# Patient Record
Sex: Female | Born: 1950 | Race: Black or African American | Hispanic: No | Marital: Married | Smoking: Never smoker
Health system: Southern US, Community
[De-identification: ages and names within clinical notes are randomized; demographics above are authoritative.]

## PROBLEM LIST (undated history)

## (undated) DIAGNOSIS — K219 Gastro-esophageal reflux disease without esophagitis: Secondary | ICD-10-CM

## (undated) DIAGNOSIS — N879 Dysplasia of cervix uteri, unspecified: Secondary | ICD-10-CM

## (undated) DIAGNOSIS — T7840XA Allergy, unspecified, initial encounter: Secondary | ICD-10-CM

## (undated) DIAGNOSIS — F329 Major depressive disorder, single episode, unspecified: Secondary | ICD-10-CM

## (undated) DIAGNOSIS — G4730 Sleep apnea, unspecified: Secondary | ICD-10-CM

## (undated) DIAGNOSIS — M549 Dorsalgia, unspecified: Secondary | ICD-10-CM

## (undated) DIAGNOSIS — I10 Essential (primary) hypertension: Secondary | ICD-10-CM

## (undated) DIAGNOSIS — G4733 Obstructive sleep apnea (adult) (pediatric): Secondary | ICD-10-CM

## (undated) DIAGNOSIS — E669 Obesity, unspecified: Secondary | ICD-10-CM

## (undated) DIAGNOSIS — M75100 Unspecified rotator cuff tear or rupture of unspecified shoulder, not specified as traumatic: Secondary | ICD-10-CM

## (undated) DIAGNOSIS — R0602 Shortness of breath: Secondary | ICD-10-CM

## (undated) DIAGNOSIS — D649 Anemia, unspecified: Secondary | ICD-10-CM

## (undated) DIAGNOSIS — M1990 Unspecified osteoarthritis, unspecified site: Secondary | ICD-10-CM

## (undated) DIAGNOSIS — IMO0002 Reserved for concepts with insufficient information to code with codable children: Secondary | ICD-10-CM

## (undated) DIAGNOSIS — F32A Depression, unspecified: Secondary | ICD-10-CM

## (undated) DIAGNOSIS — D571 Sickle-cell disease without crisis: Secondary | ICD-10-CM

## (undated) DIAGNOSIS — E785 Hyperlipidemia, unspecified: Secondary | ICD-10-CM

## (undated) DIAGNOSIS — Z9289 Personal history of other medical treatment: Secondary | ICD-10-CM

## (undated) DIAGNOSIS — E66811 Obesity, class 1: Secondary | ICD-10-CM

## (undated) DIAGNOSIS — E6609 Other obesity due to excess calories: Secondary | ICD-10-CM

## (undated) DIAGNOSIS — I2699 Other pulmonary embolism without acute cor pulmonale: Secondary | ICD-10-CM

## (undated) HISTORY — DX: Depression, unspecified: F32.A

## (undated) HISTORY — DX: Major depressive disorder, single episode, unspecified: F32.9

## (undated) HISTORY — PX: BREAST EXCISIONAL BIOPSY: SUR124

## (undated) HISTORY — PX: INJECTION KNEE: SHX2446

## (undated) HISTORY — DX: Hyperlipidemia, unspecified: E78.5

## (undated) HISTORY — DX: Allergy, unspecified, initial encounter: T78.40XA

## (undated) HISTORY — PX: VAGINAL HYSTERECTOMY: SUR661

## (undated) HISTORY — PX: INGUINAL HERNIA REPAIR: SUR1180

## (undated) HISTORY — DX: Sickle-cell disease without crisis: D57.1

## (undated) HISTORY — DX: Dysplasia of cervix uteri, unspecified: N87.9

## (undated) HISTORY — DX: Obesity, class 1: E66.811

## (undated) HISTORY — DX: Obesity, unspecified: E66.9

## (undated) HISTORY — DX: Obstructive sleep apnea (adult) (pediatric): G47.33

## (undated) HISTORY — DX: Other obesity due to excess calories: E66.09

## (undated) HISTORY — DX: Other pulmonary embolism without acute cor pulmonale: I26.99

## (undated) HISTORY — DX: Essential (primary) hypertension: I10

## (undated) HISTORY — PX: INCONTINENCE SURGERY: SHX676

## (undated) HISTORY — PX: TONSILLECTOMY: SUR1361

## (undated) HISTORY — PX: CATARACT EXTRACTION: SUR2

## (undated) HISTORY — PX: TOE SURGERY: SHX1073

## (undated) HISTORY — PX: OTHER SURGICAL HISTORY: SHX169

## (undated) HISTORY — DX: Gastro-esophageal reflux disease without esophagitis: K21.9

## (undated) HISTORY — PX: TOE AMPUTATION: SHX809

## (undated) HISTORY — PX: ABDOMINAL HYSTERECTOMY: SHX81

## (undated) HISTORY — PX: EYE SURGERY: SHX253

## (undated) HISTORY — PX: BREAST LUMPECTOMY: SHX2

## (undated) HISTORY — PX: BREAST BIOPSY: SHX20

---

## 1975-08-09 DIAGNOSIS — Z86711 Personal history of pulmonary embolism: Secondary | ICD-10-CM

## 1975-08-09 DIAGNOSIS — I2699 Other pulmonary embolism without acute cor pulmonale: Secondary | ICD-10-CM

## 1975-08-09 HISTORY — DX: Other pulmonary embolism without acute cor pulmonale: I26.99

## 1975-08-09 HISTORY — DX: Personal history of pulmonary embolism: Z86.711

## 1983-12-09 HISTORY — PX: REVISION OF SCAR ON TORSO: SHX2350

## 2012-04-01 HISTORY — PX: ESOPHAGOGASTRODUODENOSCOPY: SHX1529

## 2012-06-14 ENCOUNTER — Encounter: Payer: Self-pay | Admitting: Internal Medicine

## 2012-06-14 ENCOUNTER — Ambulatory Visit (INDEPENDENT_AMBULATORY_CARE_PROVIDER_SITE_OTHER): Payer: Federal, State, Local not specified - PPO | Admitting: Internal Medicine

## 2012-06-14 VITALS — BP 118/84 | HR 67 | Temp 97.2°F | Resp 16 | Ht 68.0 in | Wt 243.0 lb

## 2012-06-14 DIAGNOSIS — F338 Other recurrent depressive disorders: Secondary | ICD-10-CM

## 2012-06-14 DIAGNOSIS — I10 Essential (primary) hypertension: Secondary | ICD-10-CM | POA: Insufficient documentation

## 2012-06-14 DIAGNOSIS — K439 Ventral hernia without obstruction or gangrene: Secondary | ICD-10-CM

## 2012-06-14 DIAGNOSIS — M1710 Unilateral primary osteoarthritis, unspecified knee: Secondary | ICD-10-CM

## 2012-06-14 DIAGNOSIS — R32 Unspecified urinary incontinence: Secondary | ICD-10-CM

## 2012-06-14 DIAGNOSIS — E785 Hyperlipidemia, unspecified: Secondary | ICD-10-CM

## 2012-06-14 DIAGNOSIS — IMO0002 Reserved for concepts with insufficient information to code with codable children: Secondary | ICD-10-CM

## 2012-06-14 DIAGNOSIS — F39 Unspecified mood [affective] disorder: Secondary | ICD-10-CM

## 2012-06-14 DIAGNOSIS — E782 Mixed hyperlipidemia: Secondary | ICD-10-CM | POA: Insufficient documentation

## 2012-06-14 DIAGNOSIS — K219 Gastro-esophageal reflux disease without esophagitis: Secondary | ICD-10-CM | POA: Insufficient documentation

## 2012-06-14 DIAGNOSIS — Z90710 Acquired absence of both cervix and uterus: Secondary | ICD-10-CM

## 2012-06-14 DIAGNOSIS — Z87898 Personal history of other specified conditions: Secondary | ICD-10-CM

## 2012-06-14 DIAGNOSIS — Z8669 Personal history of other diseases of the nervous system and sense organs: Secondary | ICD-10-CM

## 2012-06-14 DIAGNOSIS — M179 Osteoarthritis of knee, unspecified: Secondary | ICD-10-CM

## 2012-06-14 DIAGNOSIS — Z780 Asymptomatic menopausal state: Secondary | ICD-10-CM

## 2012-06-14 DIAGNOSIS — Z87410 Personal history of cervical dysplasia: Secondary | ICD-10-CM

## 2012-06-14 HISTORY — DX: Acquired absence of both cervix and uterus: Z90.710

## 2012-06-14 HISTORY — DX: Essential (primary) hypertension: I10

## 2012-06-14 HISTORY — DX: Mixed hyperlipidemia: E78.2

## 2012-06-14 HISTORY — DX: Hyperlipidemia, unspecified: E78.5

## 2012-06-14 HISTORY — DX: Gastro-esophageal reflux disease without esophagitis: K21.9

## 2012-06-14 NOTE — Progress Notes (Signed)
Subjective:    Patient ID: Judy [name], female    DOB: [birth year], 61 y.o.   MRN: [id number]  HPI  Judy [name] is here as a new pt., first visit to establish care.  PMH of cervical dysplasia S/P Hysterectomey,  Mixed incontinence S/P bladder sling for pelvic prolapse, DJD of knees and back, seasonal depression per her history treated with Prozac in the past, HTN, GERD, hyperlipidemia, and  Sleep apnea now off CPAP from recent sleep study.  She also reports she has a long standing ventral hernia that does not bother her  She has had recent labs by Dr. Rocky [name] of [place] and does not want any labs done today  She also was treated by Dr. Noe [name] for C.Difficile and had both upper and lower endoscopy withing the last year.  She reports C diff has resolved.   She reports she would like to discuss weight loss and her incontinence.  She lost 30 # with weight watchers and is starting to gain it back.  She swims daily.  She did not like [employer].  She has mixed incontinence long standing.   She had a bladder sling repair in 2005 for pelvic prolapse.  Denies dysuria or gross blood in urine  Allergies  Allergen Reactions  . Ivp Dye (Iodinated Diagnostic Agents) Swelling   Past Medical History  Diagnosis Date  . Hypertension    No past surgical history on file. History   Social History  . Marital Status: Married    Spouse Name: N/A    Number of Children: N/A  . Years of Education: N/A   Occupational History  . Not on file.   Social History Main Topics  . Smoking status: Not on file  . Smokeless tobacco: Not on file  . Alcohol Use: Not on file  . Drug Use: Not on file  . Sexually Active: Not on file   Other Topics Concern  . Not on file   Social History Narrative  . No narrative on file   No family history on file. There is no problem list on file for this patient.  Current Outpatient Prescriptions on File Prior to Visit  Medication Sig Dispense Refill  . atorvastatin (LIPITOR) 20 MG  tablet Take 20 mg by mouth daily.      . candesartan-hydrochlorothiazide (ATACAND HCT) 16-12.5 MG per tablet Take 1 tablet by mouth daily.      . lansoprazole (PREVACID) 30 MG capsule Take 30 mg by mouth daily.          Review of Systems See HPI    Objective:   Physical Exam Physical Exam  Nursing note and vitals reviewed.  Constitutional: She is oriented to person, place, and time. She appears well-developed and well-nourished.  HENT:  Head: Normocephalic and atraumatic.  Cardiovascular: Normal rate and regular rhythm. Exam reveals no gallop and no friction rub.  No murmur heard.  Pulmonary/Chest: Breath sounds normal. She has no wheezes. She has no rales. Abd:  Easily reducible ventral hernia, mid abd wall.  BS +  NT/ND. No rebound  Neurological: She is alert and oriented to person, place, and time.  Skin: Skin is warm and dry.  Psychiatric: She has a normal mood and affect. Her behavior is normal.          Assessment & Plan:  HTN well controlled  Hyperlipidemia  Will need old records from former primary.  She does not want blood draw today  Mixed incontinence  I offered 3 options  of Pelvic floor PT, meds or urology consultation.  She opted to try PT for now.  Again did not want labs or testing today.  Will refer to Judy [name] and pt counseled to call if not helpful for her  Obesity:   Dash diet given,  Increase exercise.  Offered referral to Dr. Kinnie [name] but she wishes to wait for now  Ventral hernia  counsled if pain in area of hernia N/V swelling of abd to call office  DJD controlled  GERD  H/o cervical dyspasia  H/o sleep apnea.  Pt reports recent sleep study neg and was advised by former primary to stop CPAP

## 2012-06-14 NOTE — Patient Instructions (Signed)
See me as needed   Sign for old records  Will refer you to physical therapy

## 2012-06-15 ENCOUNTER — Encounter: Payer: Self-pay | Admitting: Internal Medicine

## 2012-06-15 DIAGNOSIS — F338 Other recurrent depressive disorders: Secondary | ICD-10-CM | POA: Insufficient documentation

## 2012-06-15 DIAGNOSIS — K439 Ventral hernia without obstruction or gangrene: Secondary | ICD-10-CM

## 2012-06-15 DIAGNOSIS — M179 Osteoarthritis of knee, unspecified: Secondary | ICD-10-CM

## 2012-06-15 DIAGNOSIS — Z87410 Personal history of cervical dysplasia: Secondary | ICD-10-CM | POA: Insufficient documentation

## 2012-06-15 DIAGNOSIS — Z8669 Personal history of other diseases of the nervous system and sense organs: Secondary | ICD-10-CM | POA: Insufficient documentation

## 2012-06-15 DIAGNOSIS — M1710 Unilateral primary osteoarthritis, unspecified knee: Secondary | ICD-10-CM | POA: Insufficient documentation

## 2012-06-15 DIAGNOSIS — R32 Unspecified urinary incontinence: Secondary | ICD-10-CM | POA: Insufficient documentation

## 2012-06-15 DIAGNOSIS — Z780 Asymptomatic menopausal state: Secondary | ICD-10-CM | POA: Insufficient documentation

## 2012-06-15 HISTORY — DX: Ventral hernia without obstruction or gangrene: K43.9

## 2012-06-15 HISTORY — DX: Unspecified urinary incontinence: R32

## 2012-06-15 HISTORY — DX: Other recurrent depressive disorders: F33.8

## 2012-06-15 HISTORY — DX: Unilateral primary osteoarthritis, unspecified knee: M17.10

## 2012-06-15 HISTORY — DX: Personal history of cervical dysplasia: Z87.410

## 2012-06-15 HISTORY — DX: Osteoarthritis of knee, unspecified: M17.9

## 2012-06-15 HISTORY — DX: Asymptomatic menopausal state: Z78.0

## 2013-01-12 ENCOUNTER — Ambulatory Visit (INDEPENDENT_AMBULATORY_CARE_PROVIDER_SITE_OTHER): Payer: Federal, State, Local not specified - PPO | Admitting: Internal Medicine

## 2013-01-12 ENCOUNTER — Encounter: Payer: Self-pay | Admitting: Internal Medicine

## 2013-01-12 VITALS — BP 130/74 | HR 76 | Temp 98.5°F | Resp 16 | Ht 68.0 in | Wt 243.0 lb

## 2013-01-12 DIAGNOSIS — R222 Localized swelling, mass and lump, trunk: Secondary | ICD-10-CM

## 2013-01-12 DIAGNOSIS — I10 Essential (primary) hypertension: Secondary | ICD-10-CM

## 2013-01-12 LAB — COMPREHENSIVE METABOLIC PANEL
ALT: 26 U/L (ref 0–35)
AST: 30 U/L (ref 0–37)
Albumin: 3.9 g/dL (ref 3.5–5.2)
Alkaline Phosphatase: 76 U/L (ref 39–117)
Potassium: 4 mEq/L (ref 3.5–5.3)
Sodium: 140 mEq/L (ref 135–145)
Total Protein: 6.8 g/dL (ref 6.0–8.3)

## 2013-01-12 LAB — LIPID PANEL: LDL Cholesterol: 94 mg/dL (ref 0–99)

## 2013-01-12 MED ORDER — AMOXICILLIN-POT CLAVULANATE 500-125 MG PO TABS: ORAL_TABLET | ORAL | Status: DC

## 2013-01-12 NOTE — Patient Instructions (Addendum)
See me in 2 weeks   Schedule CPE with me

## 2013-01-12 NOTE — Progress Notes (Signed)
Subjective:    Patient ID: Judy [name], female    DOB: 1950/12/12, 62 y.o.   MRN: [id number]  HPI Judy [name] is here with acute visit.  She noted 2 weeks ago a very sore and swollen area near her R breast.  She had been wearing a wire bra and thought maybe there was some irritation from this.  She believes the area may have gotten smaller over the two weeks but still there.  NO fever  No nipple discharge  FH  Breast cancer in GGM   Last mamm[facility] and pt reports they have been normal  Allergies  Allergen Reactions  . Ivp Dye (Iodinated Diagnostic Agents) Swelling   Past Medical History  Diagnosis Date  . Hypertension   . Dysplasia of cervix   . GERD (gastroesophageal reflux disease)   . Hyperlipidemia   . Obesity    Past Surgical History  Procedure Date  . Vaginal hysterectomy   . Tonsillectomy   . Toe surgery     congenital  . Breast lumpectomy     axillary bilat  . Tummy tuck   . Inguinal hernia repair     bilat   History   Social History  . Marital Status: Married    Spouse Name: N/A    Number of Children: N/A  . 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 Smoking status: Never Smoker   . Smokeless tobacco: Never Used  . Alcohol Use: No     Comment: rarely  . Drug Use: No  . Sexually Active: No   Other Topics Concern  . Not on file   Social History Narrative  . No narrative on file   Family History  Problem Relation Age of Onset  . Cancer Mother     lung  . Cancer Sister     breast  . Diabetes Maternal Grandmother   . Hypertension Mother   . Heart disease Son     congenital   Patient Active Problem List  Diagnosis  . S/P hysterectomy  . Essential hypertension, benign  . Other and unspecified hyperlipidemia  . GERD (gastroesophageal reflux disease)  . History of cervical dysplasia  . OA (osteoarthritis) of knee  . Seasonal depression  . Menopause  . History of sleep apnea  .  Urinary incontinence  . Ventral hernia   Current Outpatient Prescriptions on File Prior to Visit  Medication Sig Dispense Refill  . aspirin 325 MG EC tablet Take 325 mg by mouth daily.      Marland Kitchen atorvastatin (LIPITOR) 20 MG tablet Take 20 mg by mouth daily.      . candesartan-hydrochlorothiazide (ATACAND HCT) 16-12.5 MG per tablet Take 1 tablet by mouth daily.      . celecoxib (CELEBREX) 200 MG capsule Take 200 mg by mouth daily.      . Cholecalciferol (VITAMIN D) 1000 UNITS capsule Take 1,000 Units by mouth daily.      . cyclobenzaprine (FLEXERIL) 10 MG tablet Take 10 mg by mouth 3 (three) times daily as needed.      . lansoprazole (PREVACID) 30 MG capsule Take 30 mg by mouth daily.             Review of Systems    see HPI Objective:   Physical Exam  Physical Exam  Nursing note and vitals reviewed.  Constitutional: She is oriented to person, place, and time. She appears well-developed and  well-nourished.  HENT:  Head: Normocephalic and atraumatic.  Cardiovascular: Normal rate and regular rhythm. Exam reveals no gallop and no friction rub.  No murmur heard.  Pulmonary/Chest: Breath sounds normal. She has no wheezes. She has no rales.  Breast exam.  Careful exam of both breast reveals no nipple discharge no discrete mass no axillary adenopathy bilaterally.  She does have very point tender mass R lateral chest wall near her R breast that is very tender.  Measures just under 1 cm.   Neurological: She is alert and oriented to person, place, and time.  Skin: Skin is warm and dry.  Psychiatric: She has a normal mood and affect. Her behavior is normal.       Assessment & Plan:  Chest wall mass   With tenderness will empirically give Augmentin 500 bid for 10 DAYS.  She is to return to see me in two weeks.  Will also get diagnositc mm with ultrasound of R breast.    HTN:  Well controlled will get baseline labs  Pt is to schedule CPE  Pt voices understanding

## 2013-01-13 LAB — CBC WITH DIFFERENTIAL/PLATELET
Basophils Absolute: 0 10*3/uL (ref 0.0–0.1)
Basophils Relative: 0 % (ref 0–1)
Hemoglobin: 11.7 g/dL — ABNORMAL LOW (ref 12.0–15.0)
MCHC: 33.5 g/dL (ref 30.0–36.0)
Neutro Abs: 4.9 10*3/uL (ref 1.7–7.7)
Neutrophils Relative %: 59 % (ref 43–77)
RDW: 14.3 % (ref 11.5–15.5)
WBC: 8.4 10*3/uL (ref 4.0–10.5)

## 2013-01-18 ENCOUNTER — Telehealth: Payer: Self-pay | Admitting: Internal Medicine

## 2013-01-18 DIAGNOSIS — K317 Polyp of stomach and duodenum: Secondary | ICD-10-CM

## 2013-01-18 DIAGNOSIS — Z803 Family history of malignant neoplasm of breast: Secondary | ICD-10-CM

## 2013-01-18 DIAGNOSIS — Z860100 Personal history of colon polyps, unspecified: Secondary | ICD-10-CM

## 2013-01-18 DIAGNOSIS — G4733 Obstructive sleep apnea (adult) (pediatric): Secondary | ICD-10-CM

## 2013-01-18 DIAGNOSIS — Z90710 Acquired absence of both cervix and uterus: Secondary | ICD-10-CM

## 2013-01-18 HISTORY — DX: Polyp of stomach and duodenum: K31.7

## 2013-01-18 HISTORY — DX: Personal history of colonic polyps: Z86.010

## 2013-01-18 HISTORY — DX: Personal history of colon polyps, unspecified: Z86.0100

## 2013-01-18 HISTORY — DX: Obstructive sleep apnea (adult) (pediatric): G47.33

## 2013-01-18 HISTORY — DX: Family history of malignant neoplasm of breast: Z80.3

## 2013-01-18 NOTE — Telephone Encounter (Signed)
Spoke with pt and informed of MM andR breast ultrasound results.   06/2012 report from [facility] states two of pts sisters diagnosed with breast cancer  Age 60 and age  46.   Pt tells me these were half sisters on her father's side.   Her maternal GGM also had breast cancer.    Pt advised at last visit tocome in 2 weeks for re-examination.  I again advised her to keep the follow up appt so I can re-examine her. If chest wall mass still present,  I will refer for surgical opinion.  Pt voices understanding  I note that pt did not schedule a follow up visit as of thisdate.   I advised her to schedule appt. andshe is aware that breast cancer can uncommonly present as a mass on chest wall.  She voices understanding

## 2013-01-19 ENCOUNTER — Encounter: Payer: Self-pay | Admitting: *Deleted

## 2013-01-31 ENCOUNTER — Telehealth: Payer: Self-pay | Admitting: *Deleted

## 2013-02-02 ENCOUNTER — Telehealth: Payer: Self-pay | Admitting: *Deleted

## 2013-02-02 NOTE — Telephone Encounter (Signed)
Left message awaiting return call

## 2013-02-07 ENCOUNTER — Ambulatory Visit (INDEPENDENT_AMBULATORY_CARE_PROVIDER_SITE_OTHER): Payer: Federal, State, Local not specified - PPO | Admitting: Internal Medicine

## 2013-02-07 ENCOUNTER — Encounter: Payer: Self-pay | Admitting: Internal Medicine

## 2013-02-07 VITALS — BP 110/65 | HR 73 | Temp 97.8°F | Resp 18 | Wt 241.0 lb

## 2013-02-07 DIAGNOSIS — R2230 Localized swelling, mass and lump, unspecified upper limb: Secondary | ICD-10-CM | POA: Insufficient documentation

## 2013-02-07 DIAGNOSIS — R229 Localized swelling, mass and lump, unspecified: Secondary | ICD-10-CM

## 2013-02-07 DIAGNOSIS — M79621 Pain in right upper arm: Secondary | ICD-10-CM

## 2013-02-07 DIAGNOSIS — R2231 Localized swelling, mass and lump, right upper limb: Secondary | ICD-10-CM

## 2013-02-07 DIAGNOSIS — D649 Anemia, unspecified: Secondary | ICD-10-CM

## 2013-02-07 DIAGNOSIS — M79609 Pain in unspecified limb: Secondary | ICD-10-CM

## 2013-02-07 HISTORY — DX: Localized swelling, mass and lump, unspecified upper limb: R22.30

## 2013-02-07 NOTE — Progress Notes (Signed)
Subjective:    Patient ID: Judy [name], female    DOB: 03-21-1951, 62 y.o.   MRN: [id number]  HPI  Judy [name] is here for follow up..    See diagnostic mm result [facility].  R breast has no masses or calcifications.  There is scarring in R axillary area  Pt still reports pain in axillary area.  She is on Celebrex 200 mg but not helping She has very strong FH of breast cancer .  Two half sisters aged 16 and 17 and grandmother  See labs minimal anemia   Allergies  Allergen Reactions  . Ivp Dye (Iodinated Diagnostic Agents) Swelling   Past Medical History  Diagnosis Date  . Hypertension   . Dysplasia of cervix   . GERD (gastroesophageal reflux disease)   . Hyperlipidemia   . Obesity    Past Surgical History  Procedure Laterality Date  . Vaginal hysterectomy    . Tonsillectomy    . Toe surgery      congenital  . Breast lumpectomy      axillary bilat  . Tummy tuck    . Inguinal hernia repair      bilat   History   Social History  . Marital Status: Married    Spouse Name: N/A    Number of Children: N/A  . 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 Smoking status: Never Smoker   . Smokeless tobacco: Never Used  . Alcohol Use: No     Comment: rarely  . Drug Use: No  . Sexually Active: No   Other Topics Concern  . Not on file   Social History Narrative  . No narrative on file   Family History  Problem Relation Age of Onset  . Cancer Mother     lung  . Cancer Sister     breast  . Diabetes Maternal Grandmother   . Hypertension Mother   . Heart disease Son     congenital   Patient Active Problem List  Diagnosis  . S/P hysterectomy  . Essential hypertension, benign  . Other and unspecified hyperlipidemia  . GERD (gastroesophageal reflux disease)  . History of cervical dysplasia  . OA (osteoarthritis) of knee  . Seasonal depression  . Menopause  . History of sleep apnea  . Urinary  incontinence  . Ventral hernia  . Family history of breast cancer in sister  . OSA (obstructive sleep apnea)  . Personal history of colonic polyps  . Gastric polyp  . Axillary mass   Current Outpatient Prescriptions on File Prior to Visit  Medication Sig Dispense Refill  . aspirin 325 MG EC tablet Take 325 mg by mouth daily.      Marland Kitchen atorvastatin (LIPITOR) 20 MG tablet Take 20 mg by mouth daily.      . candesartan-hydrochlorothiazide (ATACAND HCT) 16-12.5 MG per tablet Take 1 tablet by mouth daily.      . celecoxib (CELEBREX) 200 MG capsule Take 200 mg by mouth daily.      . Cholecalciferol (VITAMIN D) 1000 UNITS capsule Take 1,000 Units by mouth daily.      . lansoprazole (PREVACID) 30 MG capsule Take 30 mg by mouth daily.      . cyclobenzaprine (FLEXERIL) 10 MG tablet Take 10 mg by mouth 3 (three) times daily as needed.       No current facility-administered medications on file prior to visit.  Review of Systems    see HPI Objective:   Physical Exam Physical Exam  Nursing note and vitals reviewed.  Constitutional: She is oriented to person, place, and time. She appears well-developed and well-nourished.  HENT:  Head: Normocephalic and atraumatic.  Cardiovascular: Normal rate and regular rhythm. Exam reveals no gallop and no friction rub.  No murmur heard.  Pulmonary/Chest: Breath sounds normal. She has no wheezes. She has no rales.  R breast exam.  Area of thickening lower axillary area .  Painful to palpation Neurological: She is alert and oriented to person, place, and time.  Skin: Skin is warm and dry.  Psychiatric: She has a normal mood and affect. Her behavior is normal.             Assessment & Plan:  R axillary mass/thickening.  Long discussion with pt.  Given strong FH she and I discussed surgical evaluation and we are both in agreement to this.  Will refer to surgery  Minimal anemia  Will recheck next visit.

## 2013-02-07 NOTE — Patient Instructions (Addendum)
Will refer to surgery.

## 2013-02-08 ENCOUNTER — Telehealth: Payer: Self-pay | Admitting: *Deleted

## 2013-02-08 ENCOUNTER — Encounter: Payer: Self-pay | Admitting: *Deleted

## 2013-02-08 NOTE — Telephone Encounter (Signed)
Left message regarding appt  With Dr Jamey Ripa on 3/18 at [facility]

## 2013-02-08 NOTE — Telephone Encounter (Signed)
Faxed mm report to Dr Weldon Inches office attn. Lesly Rubenstein

## 2013-02-09 ENCOUNTER — Encounter: Payer: Self-pay | Admitting: *Deleted

## 2013-02-09 ENCOUNTER — Telehealth: Payer: Self-pay | Admitting: *Deleted

## 2013-02-09 NOTE — Telephone Encounter (Signed)
Lab results mailed to pt home address

## 2013-02-14 NOTE — Telephone Encounter (Signed)
appt

## 2013-02-22 ENCOUNTER — Ambulatory Visit (INDEPENDENT_AMBULATORY_CARE_PROVIDER_SITE_OTHER): Payer: Self-pay | Admitting: Surgery

## 2013-02-23 ENCOUNTER — Ambulatory Visit (INDEPENDENT_AMBULATORY_CARE_PROVIDER_SITE_OTHER): Payer: Federal, State, Local not specified - PPO | Admitting: Internal Medicine

## 2013-02-23 ENCOUNTER — Encounter: Payer: Self-pay | Admitting: Internal Medicine

## 2013-02-23 ENCOUNTER — Ambulatory Visit (HOSPITAL_BASED_OUTPATIENT_CLINIC_OR_DEPARTMENT_OTHER)
Admission: RE | Admit: 2013-02-23 | Discharge: 2013-02-23 | Disposition: A | Discharge status: Home / Self Care | Payer: Federal, State, Local not specified - PPO | Source: Ambulatory Visit | Attending: Internal Medicine | Admitting: Internal Medicine

## 2013-02-23 VITALS — BP 139/67 | HR 84 | Temp 97.7°F | Resp 18 | Wt 240.0 lb

## 2013-02-23 DIAGNOSIS — R109 Unspecified abdominal pain: Secondary | ICD-10-CM | POA: Insufficient documentation

## 2013-02-23 DIAGNOSIS — Z87442 Personal history of urinary calculi: Secondary | ICD-10-CM | POA: Insufficient documentation

## 2013-02-23 DIAGNOSIS — R319 Hematuria, unspecified: Secondary | ICD-10-CM | POA: Insufficient documentation

## 2013-02-23 HISTORY — DX: Personal history of urinary calculi: Z87.442

## 2013-02-23 LAB — POCT URINALYSIS DIPSTICK
Bilirubin, UA: NEGATIVE
Ketones, UA: NEGATIVE
Leukocytes, UA: NEGATIVE

## 2013-02-23 MED ORDER — HYDROCODONE-ACETAMINOPHEN 7.5-750 MG PO TABS: 1.0000 | ORAL_TABLET | Freq: Four times a day (QID) | ORAL | Status: DC | PRN

## 2013-02-23 MED ORDER — CYCLOBENZAPRINE HCL 10 MG PO TABS: 10.0000 mg | ORAL_TABLET | Freq: Three times a day (TID) | ORAL | Status: DC | PRN

## 2013-02-23 NOTE — Patient Instructions (Addendum)
To have CT today  Strain urine daily

## 2013-02-23 NOTE — Progress Notes (Signed)
Subjective:    Patient ID: Judy [name], female    DOB: 1951/07/24, 62 y.o.   MRN: [id number]  HPI Judy [name] is here with acute visit.  She has had several weeks of R sided back pain.  She thought it was lumbar strain.  Yesterday had more focal pain in R flank area similar to prior kidney stone but not as severe    No fever no reported gross blood in urine.    Allergies  Allergen Reactions  . Ivp Dye (Iodinated Diagnostic Agents) Swelling   Past Medical History  Diagnosis Date  . Hypertension   . Dysplasia of cervix   . GERD (gastroesophageal reflux disease)   . Hyperlipidemia   . Obesity    Past Surgical History  Procedure Laterality Date  . Vaginal hysterectomy    . Tonsillectomy    . Toe surgery      congenital  . Breast lumpectomy      axillary bilat  . Tummy tuck    . Inguinal hernia repair      bilat   History   Social History  . Marital Status: Married    Spouse Name: N/A    Number of Children: N/A  . 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 Smoking status: Never Smoker   . Smokeless tobacco: Never Used  . Alcohol Use: No     Comment: rarely  . Drug Use: No  . Sexually Active: No   Other Topics Concern  . Not on file   Social History Narrative  . No narrative on file   Family History  Problem Relation Age of Onset  . Cancer Mother     lung  . Cancer Sister     breast  . Diabetes Maternal Grandmother   . Hypertension Mother   . Heart disease Son     congenital   Patient Active Problem List  Diagnosis  . S/P hysterectomy  . Essential hypertension, benign  . Other and unspecified hyperlipidemia  . GERD (gastroesophageal reflux disease)  . History of cervical dysplasia  . OA (osteoarthritis) of knee  . Seasonal depression  . Menopause  . History of sleep apnea  . Urinary incontinence  . Ventral hernia  . Family history of breast cancer in sister  . OSA (obstructive sleep apnea)  .  Personal history of colonic polyps  . Gastric polyp  . Axillary mass  . Personal history of renal calculi   Current Outpatient Prescriptions on File Prior to Visit  Medication Sig Dispense Refill  . aspirin 325 MG EC tablet Take 325 mg by mouth daily.      Marland Kitchen atorvastatin (LIPITOR) 20 MG tablet Take 20 mg by mouth daily.      . candesartan-hydrochlorothiazide (ATACAND HCT) 16-12.5 MG per tablet Take 1 tablet by mouth daily.      . celecoxib (CELEBREX) 200 MG capsule Take 200 mg by mouth daily.      . Cholecalciferol (VITAMIN D) 1000 UNITS capsule Take 1,000 Units by mouth daily.      . lansoprazole (PREVACID) 30 MG capsule Take 30 mg by mouth daily.      . cyclobenzaprine (FLEXERIL) 10 MG tablet Take 10 mg by mouth 3 (three) times daily as needed.       No current facility-administered medications on file prior to visit.      Review of Systems See HPI    Objective:  Physical Exam Physical Exam  Nursing note and vitals reviewed.  Constitutional: She is oriented to person, place, and time. She appears well-developed and well-nourished.  HENT:  Head: Normocephalic and atraumatic.  Cardiovascular: Normal rate and regular rhythm. Exam reveals no gallop and no friction rub.  No murmur heard.  Pulmonary/Chest: Breath sounds normal. She has no wheezes. She has no rales.  Abd  L flank no pain  R flank tender to palpation.  SLR neg bilaterally Neurological: She is alert and oriented to person, place, and time.  Skin: Skin is warm and dry.  Psychiatric: She has a normal mood and affect. Her behavior is normal.        Assessment & Plan:  Hematuria  R flank pain  Will get CT today  OK for Vicodin 7.5 mg/750  1 q6h prn.  Avoid driving   Ok for flexeril 10 mg as well  Further management based results  History of renal stone  Will refer to urology

## 2013-02-24 ENCOUNTER — Telehealth: Payer: Self-pay | Admitting: Internal Medicine

## 2013-02-24 DIAGNOSIS — M549 Dorsalgia, unspecified: Secondary | ICD-10-CM

## 2013-02-24 MED ORDER — HYDROCODONE-ACETAMINOPHEN 5-500 MG PO TABS: 1.0000 | ORAL_TABLET | Freq: Three times a day (TID) | ORAL | Status: DC | PRN

## 2013-02-24 MED ORDER — CIPROFLOXACIN HCL 500 MG PO TABS: 500.0000 mg | ORAL_TABLET | Freq: Two times a day (BID) | ORAL | Status: DC

## 2013-02-24 NOTE — Addendum Note (Signed)
Addended by: Mathews Robinsons on: 02/24/2013 03:53 PM   Modules accepted: Orders

## 2013-02-24 NOTE — Telephone Encounter (Signed)
Pt states the pain medication that was prescribe to her on 03.19.14 was not accepted bc of 750 MG OF TYLENOL... Pt states she can be reached at [phone]

## 2013-02-24 NOTE — Telephone Encounter (Signed)
Pt needs another rx for pain meds Pharmacy will not fill any rx with more than 500 mg of acetaminophen pt was also told that it could not be called in she has to bring an RX in to the store

## 2013-02-24 NOTE — Telephone Encounter (Signed)
Called in vicodin 5/325 to [facility]

## 2013-02-24 NOTE — Telephone Encounter (Signed)
Spoke with pt.  Will change Vicodin to 5/325 mg 1 q8h prn. And will empirically cover with Cipro 500 mg bid for 5 days until culture return.    Pt voices understanding

## 2013-02-25 ENCOUNTER — Ambulatory Visit (INDEPENDENT_AMBULATORY_CARE_PROVIDER_SITE_OTHER): Payer: Self-pay | Admitting: Surgery

## 2013-02-26 LAB — URINE CULTURE: Organism ID, Bacteria: NO GROWTH

## 2013-02-28 ENCOUNTER — Encounter (INDEPENDENT_AMBULATORY_CARE_PROVIDER_SITE_OTHER): Payer: Self-pay

## 2013-03-01 ENCOUNTER — Telehealth: Payer: Self-pay | Admitting: Internal Medicine

## 2013-03-01 NOTE — Telephone Encounter (Signed)
Spoke with pt and informed of urine cultrue results. She states pain is improving.  She has upcoming appt with urologist

## 2013-03-09 ENCOUNTER — Other Ambulatory Visit: Payer: Self-pay | Admitting: Internal Medicine

## 2013-03-10 NOTE — Telephone Encounter (Signed)
Vicodin called in to [facility]

## 2013-03-10 NOTE — Telephone Encounter (Signed)
Refill request will call in pending approval.Pt states that her back is killing her and sx improved slightly but have began again will make an appt when she returns from out of town. Pt states that her Sister passed away this week and she is out of town and needs pain control while she is out of town

## 2013-03-28 ENCOUNTER — Encounter: Payer: Self-pay | Admitting: Internal Medicine

## 2013-03-28 ENCOUNTER — Encounter: Payer: Self-pay | Admitting: *Deleted

## 2013-03-28 ENCOUNTER — Ambulatory Visit (INDEPENDENT_AMBULATORY_CARE_PROVIDER_SITE_OTHER): Payer: Federal, State, Local not specified - PPO | Admitting: Internal Medicine

## 2013-03-28 VITALS — BP 125/73 | HR 107 | Temp 97.4°F | Resp 18 | Wt 244.0 lb

## 2013-03-28 DIAGNOSIS — R937 Abnormal findings on diagnostic imaging of other parts of musculoskeletal system: Secondary | ICD-10-CM

## 2013-03-28 DIAGNOSIS — Z87442 Personal history of urinary calculi: Secondary | ICD-10-CM

## 2013-03-28 DIAGNOSIS — M549 Dorsalgia, unspecified: Secondary | ICD-10-CM

## 2013-03-28 HISTORY — DX: Abnormal findings on diagnostic imaging of other parts of musculoskeletal system: R93.7

## 2013-03-28 LAB — BASIC METABOLIC PANEL
CO2: 29 mEq/L (ref 19–32)
Chloride: 104 mEq/L (ref 96–112)
Creat: 0.9 mg/dL (ref 0.50–1.10)
Potassium: 4.6 mEq/L (ref 3.5–5.3)

## 2013-03-28 MED ORDER — CYCLOBENZAPRINE HCL 10 MG PO TABS: 10.0000 mg | ORAL_TABLET | Freq: Three times a day (TID) | ORAL | Status: DC | PRN

## 2013-03-28 MED ORDER — HYDROCODONE-ACETAMINOPHEN 5-325 MG PO TABS: 1.0000 | ORAL_TABLET | ORAL | Status: DC | PRN

## 2013-03-28 MED ORDER — CELECOXIB 200 MG PO CAPS: 200.0000 mg | ORAL_CAPSULE | Freq: Every day | ORAL | Status: DC

## 2013-03-28 NOTE — Progress Notes (Addendum)
Subjective:    Patient ID: Judy [name], female    DOB: [birth year], 62 y.o.   MRN: [id number]  HPI Judy [name]  Is here for acute visit. She has been travelling a lot lately and her Lumbar/sacral back pain is persistant.  She did see Dr. Mena Goes at [facility] and he felt no further work up indicarted at this time.    She uses Celebrex but not helping her pain.  See CT she does have question of hemangioma at L2 She denies numbness or paresthesias.  Pain worse on R side.  She does have history of herniated disc around 2006 .  Last MRI 2006 per pt.  She had to cancel her appt with Dr. Jamey Ripa twice .  She was to see heim for evaluation of R axillary mass.    Allergies  Allergen Reactions  . Ivp Dye (Iodinated Diagnostic Agents) Swelling   Past Medical History  Diagnosis Date  . Hypertension   . Dysplasia of cervix   . GERD (gastroesophageal reflux disease)   . Hyperlipidemia   . Obesity    Past Surgical History  Procedure Laterality Date  . Vaginal hysterectomy    . Tonsillectomy    . Toe surgery      congenital  . Breast lumpectomy      axillary bilat  . Tummy tuck    . Inguinal hernia repair      bilat   History   Social History  . Marital Status: Married    Spouse Name: N/A    Number of Children: N/A  . 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 Smoking status: Never Smoker   . Smokeless tobacco: Never Used  . Alcohol Use: No     Comment: rarely  . Drug Use: No  . Sexually Active: No   Other Topics Concern  . Not on file   Social History Narrative  . No narrative on file   Family History  Problem Relation Age of Onset  . Cancer Mother     lung  . Hypertension Mother   . Cancer Sister     breast  . Diabetes Maternal Grandmother   . Heart disease Son     congenital   Patient Active Problem List  Diagnosis  . S/P hysterectomy  . Essential hypertension, benign  . Other and unspecified hyperlipidemia  .  GERD (gastroesophageal reflux disease)  . History of cervical dysplasia  . OA (osteoarthritis) of knee  . Seasonal depression  . Menopause  . History of sleep apnea  . Urinary incontinence  . Ventral hernia  . Family history of breast cancer in sister  . OSA (obstructive sleep apnea)  . Personal history of colonic polyps  . Gastric polyp  . Axillary mass  . Personal history of renal calculi  . Abnormal CT of spine   Current Outpatient Prescriptions on File Prior to Visit  Medication Sig Dispense Refill  . aspirin 325 MG EC tablet Take 325 mg by mouth daily.      Marland Kitchen atorvastatin (LIPITOR) 20 MG tablet Take 20 mg by mouth daily.      . candesartan-hydrochlorothiazide (ATACAND HCT) 16-12.5 MG per tablet Take 1 tablet by mouth daily.      . Cholecalciferol (VITAMIN D) 1000 UNITS capsule Take 1,000 Units by mouth daily.      . lansoprazole (PREVACID) 30 MG capsule Take 30 mg by mouth daily.  No current facility-administered medications on file prior to visit.       Review of Systems See HPI    Objective:   Physical Exam  Physical Exam  Nursing note and vitals reviewed.  Constitutional: She is oriented to person, place, and time. She appears well-developed and well-nourished.  HENT:  Head: Normocephalic and atraumatic.  Cardiovascular: Normal rate and regular rhythm. Exam reveals no gallop and no friction rub.  No murmur heard.  Pulmonary/Chest: Breath sounds normal. She has no wheezes. She has no rales.  Neurological: She is alert and oriented to person, place, and time. LE:  REflexes  Sluggish bilaterally but present Motor LE  5/5 bilaterally SLR pos on Right Sensory intact to fine touch bilaterally Skin: Skin is warm and dry.  Psychiatric: She has a normal mood and affect. Her behavior is normal.             Assessment & Plan:  Persistant back pain:  With abnormal CT and question of hemangioma  Will get MRI w/wo contrast. Ok for flexeril up to TID prn and  Vicodin q4h prn.     Abnormal lumbar CT  See above.    R sided axillary mass  I counseled pt of the importance of rescheduling her evalaution with surgeon Dr. Jamey Ripa.  She voices understanding  Addendum  See CT  Multilevel disc protrusions will refer to neurosurgery

## 2013-03-28 NOTE — Patient Instructions (Addendum)
Will have MRI  Tomorrow

## 2013-03-29 ENCOUNTER — Telehealth: Payer: Self-pay | Admitting: *Deleted

## 2013-03-29 ENCOUNTER — Ambulatory Visit (INDEPENDENT_AMBULATORY_CARE_PROVIDER_SITE_OTHER): Payer: Federal, State, Local not specified - PPO

## 2013-03-29 ENCOUNTER — Other Ambulatory Visit: Payer: Self-pay | Admitting: Internal Medicine

## 2013-03-29 ENCOUNTER — Telehealth: Payer: Self-pay | Admitting: Internal Medicine

## 2013-03-29 DIAGNOSIS — D1809 Hemangioma of other sites: Secondary | ICD-10-CM

## 2013-03-29 DIAGNOSIS — M5106 Intervertebral disc disorders with myelopathy, lumbar region: Secondary | ICD-10-CM

## 2013-03-29 DIAGNOSIS — R937 Abnormal findings on diagnostic imaging of other parts of musculoskeletal system: Secondary | ICD-10-CM

## 2013-03-29 DIAGNOSIS — M5126 Other intervertebral disc displacement, lumbar region: Secondary | ICD-10-CM

## 2013-03-29 DIAGNOSIS — M545 Low back pain, unspecified: Secondary | ICD-10-CM

## 2013-03-29 MED ORDER — OXYCODONE-ACETAMINOPHEN 7.5-325 MG PO TABS: 1.0000 | ORAL_TABLET | ORAL | Status: DC | PRN

## 2013-03-29 MED ORDER — GADOBENATE DIMEGLUMINE 529 MG/ML IV SOLN
20.0000 mL | Freq: Once | INTRAVENOUS | Status: AC | PRN
  Administered 2013-03-29: 20 mL via INTRAVENOUS

## 2013-03-29 NOTE — Telephone Encounter (Signed)
Spoke with pt and informed of MRI results.    Will refer to Dr. Madalyn Rob  Set up referral to Dr. Maeola Harman for pt

## 2013-03-29 NOTE — Telephone Encounter (Signed)
Notified pt that rx for percocet was here for her to pick up

## 2013-03-29 NOTE — Telephone Encounter (Signed)
Percocet called in to [facility] was told that pt needs to bring RX into pharmacy Will notify pt

## 2013-03-29 NOTE — Telephone Encounter (Signed)
All forms faxed to Dr [facility] office

## 2013-03-30 ENCOUNTER — Other Ambulatory Visit: Payer: Self-pay | Admitting: Internal Medicine

## 2013-03-30 ENCOUNTER — Telehealth: Payer: Self-pay | Admitting: *Deleted

## 2013-03-30 MED ORDER — PREDNISONE 20 MG PO TABS: ORAL_TABLET | ORAL | Status: DC

## 2013-03-30 NOTE — Telephone Encounter (Signed)
Notified pt that prednisone was sent to pharmacy

## 2013-04-14 ENCOUNTER — Telehealth: Payer: Self-pay | Admitting: *Deleted

## 2013-04-14 MED ORDER — OXYCODONE-ACETAMINOPHEN 7.5-325 MG PO TABS: 1.0000 | ORAL_TABLET | ORAL | Status: DC | PRN

## 2013-04-14 NOTE — Telephone Encounter (Signed)
Notified pt of RX waiting for her

## 2013-04-14 NOTE — Telephone Encounter (Signed)
Pt would like a RX for pain meds she is to see Dr Venetia Maxon next week

## 2013-05-18 ENCOUNTER — Encounter: Payer: Self-pay | Admitting: Internal Medicine

## 2013-05-18 ENCOUNTER — Other Ambulatory Visit: Payer: Self-pay | Admitting: Internal Medicine

## 2013-05-18 ENCOUNTER — Telehealth: Payer: Self-pay | Admitting: *Deleted

## 2013-05-18 ENCOUNTER — Ambulatory Visit (INDEPENDENT_AMBULATORY_CARE_PROVIDER_SITE_OTHER): Payer: Federal, State, Local not specified - PPO | Admitting: Internal Medicine

## 2013-05-18 VITALS — BP 135/74 | HR 92 | Temp 98.0°F | Resp 18 | Wt 245.0 lb

## 2013-05-18 DIAGNOSIS — M25562 Pain in left knee: Secondary | ICD-10-CM

## 2013-05-18 DIAGNOSIS — G4700 Insomnia, unspecified: Secondary | ICD-10-CM | POA: Insufficient documentation

## 2013-05-18 DIAGNOSIS — M25569 Pain in unspecified knee: Secondary | ICD-10-CM

## 2013-05-18 HISTORY — DX: Insomnia, unspecified: G47.00

## 2013-05-18 MED ORDER — LORAZEPAM 1 MG PO TABS: ORAL_TABLET | ORAL | Status: DC

## 2013-05-18 NOTE — Telephone Encounter (Signed)
Called pt to see if she would like to come in at 3:45

## 2013-05-18 NOTE — Progress Notes (Signed)
Subjective:    Patient ID: Judy [name], female    DOB: [birth year], 62 y.o.   MRN: [id number]  HPI [name] is here for acute visit  She notes several weeks of L knee pain.  This has been a chronic problem for her and has responded to steroid injections in the past.  She denies injury or trauma.  She reports minimal swelling at times but no redness She denies knee locking or giving away  She did see Dr. Venetia Maxon and Dr. Ollen Bowl for lumber injection and this has greatly improved her back discomfort  See MRI  [name] also travels for speaking engagements and will have intermittant insomnia esp prior to her presentations ("hard to shut my mind down".  She is leaving [place] next week and will be travelling to [place] later this summer.  She would like something for intermittant insomnia  Allergies  Allergen Reactions  . Ivp Dye (Iodinated Diagnostic Agents) Swelling   Past Medical History  Diagnosis Date  . Hypertension   . Dysplasia of cervix   . GERD (gastroesophageal reflux disease)   . Hyperlipidemia   . Obesity    Past Surgical History  Procedure Laterality Date  . Vaginal hysterectomy    . Tonsillectomy    . Toe surgery      congenital  . Breast lumpectomy      axillary bilat  . Tummy tuck    . Inguinal hernia repair      bilat   History   Social History  . Marital Status: Married    Spouse Name: N/A    Number of Children: N/A  . 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 Smoking status: Never Smoker   . Smokeless tobacco: Never Used  . Alcohol Use: No     Comment: rarely  . Drug Use: No  . Sexually Active: No   Other Topics Concern  . Not on file   Social History Narrative  . No narrative on file   Family History  Problem Relation Age of Onset  . Cancer Mother     lung  . Hypertension Mother   . Cancer Sister     breast  . Diabetes Maternal Grandmother   . Heart disease Son     congenital    Patient Active Problem List   Diagnosis Date Noted  . Insomnia 05/18/2013  . Abnormal CT of spine 03/28/2013  . Personal history of renal calculi 02/23/2013  . Axillary mass 02/07/2013  . Family history of breast cancer in sister 01/18/2013  . OSA (obstructive sleep apnea) 01/18/2013  . Personal history of colonic polyps 01/18/2013  . Gastric polyp 01/18/2013  . History of cervical dysplasia 06/15/2012  . OA (osteoarthritis) of knee 06/15/2012  . Seasonal depression 06/15/2012  . Menopause 06/15/2012  . History of sleep apnea 06/15/2012  . Urinary incontinence 06/15/2012  . Ventral hernia 06/15/2012  . S/P hysterectomy 06/14/2012  . Essential hypertension, benign 06/14/2012  . Other and unspecified hyperlipidemia 06/14/2012  . GERD (gastroesophageal reflux disease) 06/14/2012   Current Outpatient Prescriptions on File Prior to Visit  Medication Sig Dispense Refill  . aspirin 325 MG EC tablet Take 325 mg by mouth daily.      Marland Kitchen atorvastatin (LIPITOR) 20 MG tablet Take 20 mg by mouth daily.      . candesartan-hydrochlorothiazide (ATACAND HCT) 16-12.5 MG per tablet Take 1 tablet by mouth daily.      Marland Kitchen  celecoxib (CELEBREX) 200 MG capsule Take 1 capsule (200 mg total) by mouth daily.  30 capsule  1  . Cholecalciferol (VITAMIN D) 1000 UNITS capsule Take 1,000 Units by mouth daily.      . cyclobenzaprine (FLEXERIL) 10 MG tablet Take 1 tablet (10 mg total) by mouth 3 (three) times daily as needed.  30 tablet  1  . lansoprazole (PREVACID) 30 MG capsule Take 30 mg by mouth daily.      Marland Kitchen oxyCODONE-acetaminophen (PERCOCET) 7.5-325 MG per tablet Take 1 tablet by mouth every 4 (four) hours as needed for pain.  30 tablet  0   No current facility-administered medications on file prior to visit.       Review of Systems    see HPI Objective:   Physical Exam  Physical Exam  Nursing note and vitals reviewed.  Constitutional: She is oriented to person, place, and time. She appears  well-developed and well-nourished.  HENT:  Head: Normocephalic and atraumatic.  Cardiovascular: Normal rate and regular rhythm. Exam reveals no gallop and no friction rub.  No murmur heard.  Pulmonary/Chest: Breath sounds normal. She has no wheezes. She has no rales.  Neurological: She is alert and oriented to person, place, and time.  Skin: Skin is warm and dry.  EXT:  L knee no active synovitis.  Crepitus present Lachmann's drawer signs negative Limited ROB due to discomfort Psychiatric: She has a normal mood and affect. Her behavior is normal.        Assessment & Plan:  L knee pain    I do not have prior records to determine if imaging done in the past.  She has resonded well to steroid injections in the past  Will refer to Dr. 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  Advised not to use Ativan nightly but 1-2 times per week prn

## 2013-05-19 DIAGNOSIS — M25562 Pain in left knee: Secondary | ICD-10-CM

## 2013-05-19 HISTORY — DX: Pain in left knee: M25.562

## 2013-05-19 NOTE — Telephone Encounter (Signed)
appt made

## 2013-05-19 NOTE — Patient Instructions (Addendum)
Will refer to Dr. Pearletha Forge

## 2013-05-24 ENCOUNTER — Other Ambulatory Visit: Payer: Self-pay | Admitting: *Deleted

## 2013-05-24 MED ORDER — CELECOXIB 200 MG PO CAPS: 200.0000 mg | ORAL_CAPSULE | Freq: Every day | ORAL | Status: DC

## 2013-05-24 NOTE — Telephone Encounter (Signed)
Refill request

## 2013-06-06 ENCOUNTER — Telehealth: Payer: Self-pay | Admitting: *Deleted

## 2013-06-06 NOTE — Telephone Encounter (Signed)
Patient called wanting lab work and to possibly see Dr Kathie Rhodes.  She states she has had steroid injections for her back and knee recently, and now she has tingling in her feet and has increased urination.  She is worried about her blood sugar possibly being high.

## 2013-06-07 NOTE — Telephone Encounter (Signed)
Pt states that she recently had steroid injection in knee and has had tingling in feet and frequent urination

## 2013-06-08 ENCOUNTER — Encounter: Payer: Self-pay | Admitting: Internal Medicine

## 2013-06-08 ENCOUNTER — Ambulatory Visit (INDEPENDENT_AMBULATORY_CARE_PROVIDER_SITE_OTHER): Payer: Federal, State, Local not specified - PPO | Admitting: Internal Medicine

## 2013-06-08 VITALS — BP 130/68 | HR 83 | Temp 97.4°F | Resp 18 | Wt 248.0 lb

## 2013-06-08 DIAGNOSIS — G4733 Obstructive sleep apnea (adult) (pediatric): Secondary | ICD-10-CM

## 2013-06-08 DIAGNOSIS — R32 Unspecified urinary incontinence: Secondary | ICD-10-CM

## 2013-06-08 DIAGNOSIS — I10 Essential (primary) hypertension: Secondary | ICD-10-CM

## 2013-06-08 DIAGNOSIS — R3915 Urgency of urination: Secondary | ICD-10-CM

## 2013-06-08 LAB — CBC WITH DIFFERENTIAL/PLATELET
Basophils Relative: 0 % (ref 0–1)
Eosinophils Absolute: 0.1 10*3/uL (ref 0.0–0.7)
Eosinophils Relative: 1 % (ref 0–5)
HCT: 36.6 % (ref 36.0–46.0)
Hemoglobin: 12 g/dL (ref 12.0–15.0)
Lymphs Abs: 3.1 10*3/uL (ref 0.7–4.0)
MCH: 26.3 pg (ref 26.0–34.0)
MCHC: 32.8 g/dL (ref 30.0–36.0)
MCV: 80.3 fL (ref 78.0–100.0)
Monocytes Absolute: 0.6 10*3/uL (ref 0.1–1.0)
Monocytes Relative: 6 % (ref 3–12)

## 2013-06-08 NOTE — Patient Instructions (Addendum)
Activate my chart 

## 2013-06-08 NOTE — Progress Notes (Signed)
Subjective:    Patient ID: Judy [name], female    DOB: 1951-10-29, 62 y.o.   MRN: [id number]  HPI Judy [name] is here for acute visit for two issues.  She has recent lumbar and knee steroid injections and has been having urinary frequency and tingling in her extremities, she wanted to have her glucose checked  She also has long standing urinary incontinence since her bladder sling surgery performed in 2006 for pelvic prolapse.  Incontinence not related to stress or urge,  She reports she leaks mostly at night in any position.  She has worn a pad for years.    Pt also reports a history of OSA requiring CPAP  She had managed to lose 30 lbs and did not require her machine.  She has been gaining weight and feels her OSA has returned and would like to be evaluated again.  Chronic daytime fatigue issues and trouble maintaining sleep  Allergies  Allergen Reactions  . Ivp Dye (Iodinated Diagnostic Agents) Swelling   Past Medical History  Diagnosis Date  . Hypertension   . Dysplasia of cervix   . GERD (gastroesophageal reflux disease)   . Hyperlipidemia   . Obesity    Past Surgical History  Procedure Laterality Date  . Vaginal hysterectomy    . Tonsillectomy    . Toe surgery      congenital  . Breast lumpectomy      axillary bilat  . Tummy tuck    . Inguinal hernia repair      bilat   History   Social History  . Marital Status: Married    Spouse Name: N/A    Number of Children: N/A  . 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 Smoking status: Never Smoker   . Smokeless tobacco: Never Used  . Alcohol Use: No     Comment: rarely  . Drug Use: No  . Sexually Active: No   Other Topics Concern  . Not on file   Social History Narrative  . No narrative on file   Family History  Problem Relation Age of Onset  . Cancer Mother     lung  . Hypertension Mother   . Cancer Sister     breast  . Diabetes Maternal Grandmother   .  Heart disease Son     congenital   Patient Active Problem List   Diagnosis Date Noted  . Left knee pain 05/19/2013  . Insomnia 05/18/2013  . Abnormal CT of spine 03/28/2013  . Personal history of renal calculi 02/23/2013  . Axillary mass 02/07/2013  . Family history of breast cancer in sister 01/18/2013  . OSA (obstructive sleep apnea) 01/18/2013  . Personal history of colonic polyps 01/18/2013  . Gastric polyp 01/18/2013  . History of cervical dysplasia 06/15/2012  . OA (osteoarthritis) of knee 06/15/2012  . Seasonal depression 06/15/2012  . Menopause 06/15/2012  . History of sleep apnea 06/15/2012  . Urinary incontinence 06/15/2012  . Ventral hernia 06/15/2012  . S/P hysterectomy 06/14/2012  . Essential hypertension, benign 06/14/2012  . Other and unspecified hyperlipidemia 06/14/2012  . GERD (gastroesophageal reflux disease) 06/14/2012   Current Outpatient Prescriptions on File Prior to Visit  Medication Sig Dispense Refill  . aspirin 325 MG EC tablet Take 325 mg by mouth daily.      Marland Kitchen atorvastatin (LIPITOR) 20 MG tablet Take 20 mg by mouth daily.      Marland Kitchen  candesartan-hydrochlorothiazide (ATACAND HCT) 16-12.5 MG per tablet Take 1 tablet by mouth daily.      . celecoxib (CELEBREX) 200 MG capsule Take 1 capsule (200 mg total) by mouth daily.  30 capsule  2  . Cholecalciferol (VITAMIN D) 1000 UNITS capsule Take 1,000 Units by mouth daily.      . cyclobenzaprine (FLEXERIL) 10 MG tablet Take 1 tablet (10 mg total) by mouth 3 (three) times daily as needed.  30 tablet  1  . lansoprazole (PREVACID) 30 MG capsule Take 30 mg by mouth daily.      Marland Kitchen LORazepam (ATIVAN) 1 MG tablet Take 1/2 to one tablet at hs for inomnia  20 tablet  0  . oxyCODONE-acetaminophen (PERCOCET) 7.5-325 MG per tablet Take 1 tablet by mouth every 4 (four) hours as needed for pain.  30 tablet  0   No current facility-administered medications on file prior to visit.       Review of Systems See HPI     Objective:   Physical Exam Physical Exam  Nursing note and vitals reviewed.  Constitutional: She is oriented to person, place, and time. She appears well-developed and well-nourished.  HENT:  Head: Normocephalic and atraumatic.  Cardiovascular: Normal rate and regular rhythm. Exam reveals no gallop and no friction rub.  No murmur heard.  Pulmonary/Chest: Breath sounds normal. She has no wheezes. She has no rales.  Neurological: She is alert and oriented to person, place, and time.  Skin: Skin is warm and dry.  Ext:  No edema Psychiatric: She has a normal mood and affect. Her behavior is normal.          Assessment & Plan:  Urinary urgency  /long standing incontinence  :  U/a today unremarkable,  Will refer to pelvic floor PT.  She does not wish meds.  Will check chemistries today  Fatigue  Will check Tsh  CBC  History of OSA  Will refer to pulmonologist for re-evaluation

## 2013-06-09 ENCOUNTER — Telehealth: Payer: Self-pay | Admitting: *Deleted

## 2013-06-09 LAB — COMPREHENSIVE METABOLIC PANEL
Alkaline Phosphatase: 81 U/L (ref 39–117)
BUN: 13 mg/dL (ref 6–23)
CO2: 29 mEq/L (ref 19–32)
Glucose, Bld: 96 mg/dL (ref 70–99)
Total Bilirubin: 0.7 mg/dL (ref 0.3–1.2)

## 2013-06-09 LAB — VITAMIN D 25 HYDROXY (VIT D DEFICIENCY, FRACTURES): Vit D, 25-Hydroxy: 34 ng/mL (ref 30–89)

## 2013-06-09 LAB — LIPID PANEL
Cholesterol: 229 mg/dL — ABNORMAL HIGH (ref 0–200)
HDL: 50 mg/dL (ref >39)
Total CHOL/HDL Ratio: 4.6 Ratio

## 2013-06-09 LAB — TSH: TSH: 1.011 u[IU]/mL (ref 0.350–4.500)

## 2013-06-09 NOTE — Telephone Encounter (Signed)
Message copied by Mathews Robinsons on Thu Jun 09, 2013 12:14 PM ------      Message from: Raechel Chute D      Created: Thu Jun 09, 2013 10:44 AM       [name]            Call pt and let her know that her bad cholesterol is high.  Give her a 30 min appt (no hurry)  At some time to talk about this ------

## 2013-06-09 NOTE — Telephone Encounter (Signed)
Message copied by Mathews Robinsons on Thu Jun 09, 2013 12:13 PM ------      Message from: Raechel Chute D      Created: Thu Jun 09, 2013 10:44 AM       [name]            Call pt and let her know that her bad cholesterol is high.  Give her a 30 min appt (no hurry)  At some time to talk about this ------

## 2013-06-09 NOTE — Telephone Encounter (Signed)
Notified pt of lab results and need for visit  Pt will call back after the holidays

## 2013-06-13 ENCOUNTER — Ambulatory Visit: Payer: Federal, State, Local not specified - PPO | Attending: Internal Medicine | Admitting: Physical Therapy

## 2013-06-13 DIAGNOSIS — M25559 Pain in unspecified hip: Secondary | ICD-10-CM | POA: Insufficient documentation

## 2013-06-13 DIAGNOSIS — M629 Disorder of muscle, unspecified: Secondary | ICD-10-CM | POA: Insufficient documentation

## 2013-06-13 DIAGNOSIS — M2420 Disorder of ligament, unspecified site: Secondary | ICD-10-CM | POA: Insufficient documentation

## 2013-06-13 DIAGNOSIS — IMO0001 Reserved for inherently not codable concepts without codable children: Secondary | ICD-10-CM | POA: Insufficient documentation

## 2013-06-13 DIAGNOSIS — M25659 Stiffness of unspecified hip, not elsewhere classified: Secondary | ICD-10-CM | POA: Insufficient documentation

## 2013-06-13 DIAGNOSIS — N949 Unspecified condition associated with female genital organs and menstrual cycle: Secondary | ICD-10-CM | POA: Insufficient documentation

## 2013-06-14 ENCOUNTER — Ambulatory Visit: Payer: Federal, State, Local not specified - PPO | Admitting: Physical Therapy

## 2013-06-25 ENCOUNTER — Encounter: Payer: Self-pay | Admitting: Internal Medicine

## 2013-06-25 DIAGNOSIS — Z87448 Personal history of other diseases of urinary system: Secondary | ICD-10-CM

## 2013-06-25 DIAGNOSIS — Z8719 Personal history of other diseases of the digestive system: Secondary | ICD-10-CM | POA: Insufficient documentation

## 2013-06-25 HISTORY — DX: Personal history of other diseases of the digestive system: Z87.19

## 2013-06-25 HISTORY — DX: Personal history of other diseases of urinary system: Z87.448

## 2013-06-27 ENCOUNTER — Ambulatory Visit: Payer: Federal, State, Local not specified - PPO | Admitting: Physical Therapy

## 2013-06-29 ENCOUNTER — Ambulatory Visit: Payer: Federal, State, Local not specified - PPO | Admitting: Physical Therapy

## 2013-06-29 ENCOUNTER — Ambulatory Visit (INDEPENDENT_AMBULATORY_CARE_PROVIDER_SITE_OTHER): Payer: Federal, State, Local not specified - PPO | Admitting: Internal Medicine

## 2013-06-29 ENCOUNTER — Encounter: Payer: Self-pay | Admitting: Internal Medicine

## 2013-06-29 VITALS — BP 130/70 | HR 91 | Temp 98.5°F | Resp 16 | Wt 242.0 lb

## 2013-06-29 DIAGNOSIS — Z87898 Personal history of other specified conditions: Secondary | ICD-10-CM

## 2013-06-29 DIAGNOSIS — Z8669 Personal history of other diseases of the nervous system and sense organs: Secondary | ICD-10-CM

## 2013-06-29 DIAGNOSIS — E785 Hyperlipidemia, unspecified: Secondary | ICD-10-CM

## 2013-06-29 DIAGNOSIS — I10 Essential (primary) hypertension: Secondary | ICD-10-CM

## 2013-06-29 NOTE — Progress Notes (Signed)
Subjective:    Patient ID: Judy [name], female    DOB: [birth year], 62 y.o.   MRN: [id number]  HPI  Judy [name] is here for follow up  She continues to have excessive daytime sleepiness.  She reports she sleeps at night and has not needed to use her Ativan.  She denies depressed mood.  She does take long plane trips for her job  Labs unrevealing .  She does have a history of  Sleep apnea and pending an appt for sleep evaluation  See Lipids  She stopped taking her  Lipitor  Allergies  Allergen Reactions  . Ivp Dye (Iodinated Diagnostic Agents) Swelling   Past Medical History  Diagnosis Date  . Hypertension   . Dysplasia of cervix   . GERD (gastroesophageal reflux disease)   . Hyperlipidemia   . Obesity    Past Surgical History  Procedure Laterality Date  . Vaginal hysterectomy    . Tonsillectomy    . Toe surgery      congenital  . Breast lumpectomy      axillary bilat  . Tummy tuck    . Inguinal hernia repair      bilat  . Injection knee     History   Social History  . Marital Status: Married    Spouse Name: N/A    Number of Children: N/A  . 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 Smoking status: Never Smoker   . Smokeless tobacco: Never Used  . Alcohol Use: No     Comment: rarely  . Drug Use: No  . Sexually Active: No   Other Topics Concern  . Not on file   Social History Narrative  . No narrative on file   Family History  Problem Relation Age of Onset  . Cancer Mother     lung  . Hypertension Mother   . Cancer Sister     breast  . Diabetes Maternal Grandmother   . Heart disease Son     congenital   Patient Active Problem List   Diagnosis Date Noted  . History of hematuria 06/25/2013  . History of gastric polyp 06/25/2013  . Left knee pain 05/19/2013  . Insomnia 05/18/2013  . Abnormal CT of spine 03/28/2013  . Personal history of renal calculi 02/23/2013  . Axillary mass 02/07/2013  .  Family history of breast cancer in sister 01/18/2013  . OSA (obstructive sleep apnea) 01/18/2013  . Personal history of colonic polyps 01/18/2013  . Gastric polyp 01/18/2013  . History of cervical dysplasia 06/15/2012  . OA (osteoarthritis) of knee 06/15/2012  . Seasonal depression 06/15/2012  . Menopause 06/15/2012  . History of sleep apnea 06/15/2012  . Urinary incontinence 06/15/2012  . Ventral hernia 06/15/2012  . S/P hysterectomy 06/14/2012  . Essential hypertension, benign 06/14/2012  . Other and unspecified hyperlipidemia 06/14/2012  . GERD (gastroesophageal reflux disease) 06/14/2012   Current Outpatient Prescriptions on File Prior to Visit  Medication Sig Dispense Refill  . aspirin 325 MG EC tablet Take 325 mg by mouth daily.      Marland Kitchen atorvastatin (LIPITOR) 20 MG tablet Take 20 mg by mouth daily.      . candesartan-hydrochlorothiazide (ATACAND HCT) 16-12.5 MG per tablet Take 1 tablet by mouth daily.      . celecoxib (CELEBREX) 200 MG capsule Take 1 capsule (200 mg total) by mouth daily.  30 capsule  2  . Cholecalciferol (  VITAMIN D) 1000 UNITS capsule Take 1,000 Units by mouth daily.      . cyclobenzaprine (FLEXERIL) 10 MG tablet Take 1 tablet (10 mg total) by mouth 3 (three) times daily as needed.  30 tablet  1  . lansoprazole (PREVACID) 30 MG capsule Take 30 mg by mouth daily.      Marland Kitchen LORazepam (ATIVAN) 1 MG tablet Take 1/2 to one tablet at hs for inomnia  20 tablet  0  . oxyCODONE-acetaminophen (PERCOCET) 7.5-325 MG per tablet Take 1 tablet by mouth every 4 (four) hours as needed for pain.  30 tablet  0   No current facility-administered medications on file prior to visit.    Review of Systems See HPI    Objective:   Physical Exam  Physical Exam  Nursing note and vitals reviewed.  Constitutional: She is oriented to person, place, and time. She appears well-developed and well-nourished.  HENT:  Head: Normocephalic and atraumatic.  Cardiovascular: Normal rate and  regular rhythm. Exam reveals no gallop and no friction rub.  No murmur heard.  Pulmonary/Chest: Breath sounds normal. She has no wheezes. She has no rales.  Neurological: She is alert and oriented to person, place, and time.  Skin: Skin is warm and dry.  Psychiatric: She has a normal mood and affect. Her behavior is normal.            Assessment & Plan:  Fatigue  Pending work up for OSA  Hyperlipidemia I reviewed framingham risk score with pt  12.5%  She would like to  re-initiate lipitor after evaluation for sleep apnea  HTN good control

## 2013-06-29 NOTE — Patient Instructions (Addendum)
Medication list attached

## 2013-07-03 ENCOUNTER — Encounter: Payer: Self-pay | Admitting: Internal Medicine

## 2013-07-03 DIAGNOSIS — M47816 Spondylosis without myelopathy or radiculopathy, lumbar region: Secondary | ICD-10-CM

## 2013-07-03 HISTORY — DX: Spondylosis without myelopathy or radiculopathy, lumbar region: M47.816

## 2013-07-19 ENCOUNTER — Encounter: Payer: Self-pay | Admitting: Pulmonary Disease

## 2013-07-19 ENCOUNTER — Ambulatory Visit (INDEPENDENT_AMBULATORY_CARE_PROVIDER_SITE_OTHER): Payer: Federal, State, Local not specified - PPO | Admitting: Pulmonary Disease

## 2013-07-19 VITALS — BP 124/78 | HR 75 | Temp 98.3°F | Ht 68.0 in | Wt 248.0 lb

## 2013-07-19 DIAGNOSIS — G4733 Obstructive sleep apnea (adult) (pediatric): Secondary | ICD-10-CM

## 2013-07-19 NOTE — Assessment & Plan Note (Addendum)
Given excessive daytime somnolence, narrow pharyngeal exam, witnessed apneas & loud snoring, obstructive sleep apnea is very likely & an overnight polysomnogram will be scheduled as a split study. The pathophysiology of obstructive sleep apnea , it's cardiovascular consequences & modes of treatment including CPAP were discused with the patient in detail & they evidenced understanding. Given that her symptoms have recurred again with weight gain, does very likely that she will need CPAP therapy again Start using CPAP again with full face mask Supplies will be provided Turn in the card before your next appt so that download can be reviewed on current settings

## 2013-07-19 NOTE — Progress Notes (Signed)
Subjective:    Patient ID: Judy [name], female    DOB: 1951/09/22, 62 y.o.   MRN: [id number]  HPI  PCP- Schoenhoff  62 year old never smoker referred for evaluation of obstructive sleep apnea. She also has hypertension , hyperlipidemia, chronic back pain and a remote history of  PE. Epworth sleepiness score is 12 her 24 and she report sleepiness while watching TV and laying down to rest in the afternoon. She has not had any driving incidents. Bedtime is between 10:50 PM, sleep latency is about 20 minutes. She sleeps on her side with 2 pillows, has one nocturnal awakening, denies nocturia and is out of bed at 7 AM feeling tired and occasional morning headaches for last 2 years. She has gained 20 pounds in this period. She denies excessive use of caffeinated beverages. There is no history suggestive of cataplexy, sleep paralysis or parasomnias She underwent an initial polysomnogram in [place] in 2008 and was told that she had REM-related OSA in the early morning, her repeat polysomnogram in 2012 at [facility] showed resolution of OSA, hence CPAP was discontinued. She had lost 35 pounds in the interim but now symptoms seem to have surfaced again.   Past Medical History  Diagnosis Date  . Hypertension   . Dysplasia of cervix   . GERD (gastroesophageal reflux disease)   . Hyperlipidemia   . Obesity   . Pulmonary embolism   . OSA (obstructive sleep apnea)     Past Surgical History  Procedure Laterality Date  . Vaginal hysterectomy    . Tonsillectomy    . Toe surgery      congenital  . Breast lumpectomy      axillary bilat  . Tummy tuck    . Inguinal hernia repair      bilat  . Injection knee      and back    Allergies  Allergen Reactions  . Ivp Dye [Iodinated Diagnostic Agents] Swelling    History   Social History  . Marital Status: Married    Spouse Name: N/A    Number of Children: N/A  . Years of Education: N/A   Occupational History  . consultant    Social  History Main Topics  . Smoking status: Never Smoker   . Smokeless tobacco: Never Used  . Alcohol Use: No     Comment: rarely  . Drug Use: No  . Sexual Activity: No   Other Topics Concern  . Not on file   Social History Narrative  . No narrative on file    Family History  Problem Relation Age of Onset  . Lung cancer Mother     lung  . Hypertension Mother   . Breast cancer Sister     x2  . Diabetes Maternal Grandmother   . Heart disease Son     congenital      Review of Systems  Constitutional: Positive for unexpected weight change. Negative for fever.  HENT: Positive for ear pain. Negative for nosebleeds, congestion, sore throat, rhinorrhea, sneezing, trouble swallowing, dental problem, postnasal drip and sinus pressure.   Eyes: Negative for redness and itching.  Respiratory: Negative for cough, chest tightness, shortness of breath and wheezing.   Cardiovascular: Positive for leg swelling. Negative for palpitations.  Gastrointestinal: Negative for nausea and vomiting.  Genitourinary: Negative for dysuria.  Musculoskeletal: Negative for joint swelling.  Skin: Negative for rash.  Neurological: Positive for headaches.  Hematological: Does not bruise/bleed easily.  Psychiatric/Behavioral: Negative for dysphoric mood. The  patient is not nervous/anxious.        Objective:   Physical Exam  Gen. Pleasant, obese, in no distress, normal affect ENT - no lesions, no post nasal drip, class 2-3 airway Neck: No JVD, no thyromegaly, no carotid bruits Lungs: no use of accessory muscles, no dullness to percussion, decreased without rales or rhonchi  Cardiovascular: Rhythm regular, heart sounds  normal, no murmurs or gallops, no peripheral edema Abdomen: soft and non-tender, no hepatosplenomegaly, BS normal. Musculoskeletal: No deformities, no cyanosis or clubbing Neuro:  alert, non focal, no tremors       Assessment & Plan:

## 2013-07-19 NOTE — Patient Instructions (Addendum)
Sleep study Start using CPAP again with full face mask Supplies will be provided Turn in the card before your next appt

## 2013-07-20 ENCOUNTER — Telehealth: Payer: Self-pay | Admitting: Pulmonary Disease

## 2013-07-20 NOTE — Telephone Encounter (Signed)
I have not. I will have to call in AM to get this faxed over ASAP

## 2013-07-20 NOTE — Telephone Encounter (Signed)
mindy did you get these studies yet thanks libby Tobe Sos

## 2013-07-20 NOTE — Telephone Encounter (Signed)
Spoke to pt she is aware we do not have her sleep studies yet and we can not get the cpap equiptment until we have it pt is ok with this and will flu later Tobe Sos

## 2013-07-21 NOTE — Telephone Encounter (Signed)
Holly returned call. She says she just needed the triage fax # so that she can send the sleep study to dr Vassie Loll. (I gave her the #). Hazel Sams

## 2013-07-21 NOTE — Telephone Encounter (Signed)
Sleep studies have been received. Placed in RA look at

## 2013-07-21 NOTE — Telephone Encounter (Signed)
LMTCB x1 for [facility]

## 2013-07-21 NOTE — Telephone Encounter (Signed)
Judy [name] from [place] called back requesting to speak to nurse.  She can be reached @ [phone]. Judy [name]

## 2013-07-21 NOTE — Telephone Encounter (Signed)
I have called [facility] [phone] to get this faxed over as I sent over release on Tuesday. I was advised it was not received and was giving another fax # [phone]. I have re faxed this over . I also called [facility] at [phone] to get the study from them faxed over as well. Release was also faxed to them on Tuesday. Toma Copier is faxing this over to triage and will await fax.

## 2013-07-22 ENCOUNTER — Other Ambulatory Visit: Payer: Self-pay | Admitting: Pulmonary Disease

## 2013-07-22 DIAGNOSIS — G4733 Obstructive sleep apnea (adult) (pediatric): Secondary | ICD-10-CM

## 2013-07-25 ENCOUNTER — Telehealth: Payer: Self-pay | Admitting: Pulmonary Disease

## 2013-07-25 NOTE — Telephone Encounter (Signed)
PSG 7/09 -260 lbs - AHI 8/h corrected by nasal  CPAP 12 cm. 11/12 AHI 5.6/h -wt 235 lbs, REM AHI 17/h (95 mins)

## 2013-07-26 ENCOUNTER — Other Ambulatory Visit: Payer: Self-pay | Admitting: Pulmonary Disease

## 2013-07-26 DIAGNOSIS — G4733 Obstructive sleep apnea (adult) (pediatric): Secondary | ICD-10-CM

## 2013-08-01 ENCOUNTER — Encounter (HOSPITAL_BASED_OUTPATIENT_CLINIC_OR_DEPARTMENT_OTHER): Payer: Federal, State, Local not specified - PPO

## 2013-08-02 ENCOUNTER — Telehealth: Payer: Self-pay | Admitting: Pulmonary Disease

## 2013-08-02 NOTE — Telephone Encounter (Signed)
lmomtcb x1 for pt 

## 2013-08-02 NOTE — Telephone Encounter (Signed)
RA did order sleep study on pt and it is scheduled. Do we need to tell her any particular thing other than date/time PCC's thanks

## 2013-08-02 NOTE — Telephone Encounter (Signed)
Pt returned call.  Holly D Pryor ° °

## 2013-08-03 ENCOUNTER — Other Ambulatory Visit (INDEPENDENT_AMBULATORY_CARE_PROVIDER_SITE_OTHER): Payer: Federal, State, Local not specified - PPO

## 2013-08-03 ENCOUNTER — Ambulatory Visit (INDEPENDENT_AMBULATORY_CARE_PROVIDER_SITE_OTHER)
Admission: RE | Admit: 2013-08-03 | Discharge: 2013-08-03 | Disposition: A | Discharge status: Home / Self Care | Payer: Federal, State, Local not specified - PPO | Source: Ambulatory Visit | Attending: Internal Medicine | Admitting: Internal Medicine

## 2013-08-03 ENCOUNTER — Encounter: Payer: Self-pay | Admitting: Internal Medicine

## 2013-08-03 ENCOUNTER — Telehealth: Payer: Self-pay | Admitting: *Deleted

## 2013-08-03 ENCOUNTER — Ambulatory Visit (INDEPENDENT_AMBULATORY_CARE_PROVIDER_SITE_OTHER): Payer: Federal, State, Local not specified - PPO | Admitting: Internal Medicine

## 2013-08-03 ENCOUNTER — Telehealth: Payer: Self-pay | Admitting: Pulmonary Disease

## 2013-08-03 VITALS — BP 136/80 | HR 70 | Temp 97.7°F | Ht 67.25 in | Wt 249.0 lb

## 2013-08-03 DIAGNOSIS — I272 Pulmonary hypertension, unspecified: Secondary | ICD-10-CM

## 2013-08-03 DIAGNOSIS — R0600 Dyspnea, unspecified: Secondary | ICD-10-CM

## 2013-08-03 DIAGNOSIS — M7989 Other specified soft tissue disorders: Secondary | ICD-10-CM

## 2013-08-03 DIAGNOSIS — R0609 Other forms of dyspnea: Secondary | ICD-10-CM

## 2013-08-03 HISTORY — DX: Pulmonary hypertension, unspecified: I27.20

## 2013-08-03 HISTORY — DX: Other specified soft tissue disorders: M79.89

## 2013-08-03 LAB — BASIC METABOLIC PANEL
BUN: 12 mg/dL (ref 6–23)
CO2: 29 mEq/L (ref 19–32)
Calcium: 8.9 mg/dL (ref 8.4–10.5)
GFR: 85.43 mL/min (ref >60.00)
Glucose, Bld: 92 mg/dL (ref 70–99)
Potassium: 3.8 mEq/L (ref 3.5–5.1)
Sodium: 137 mEq/L (ref 135–145)

## 2013-08-03 LAB — CBC WITH DIFFERENTIAL/PLATELET
Basophils Absolute: 0 10*3/uL (ref 0.0–0.1)
Eosinophils Absolute: 0.1 10*3/uL (ref 0.0–0.7)
HCT: 36.3 % (ref 36.0–46.0)
Hemoglobin: 12.3 g/dL (ref 12.0–15.0)
Lymphs Abs: 2.2 10*3/uL (ref 0.7–4.0)
MCHC: 33.8 g/dL (ref 30.0–36.0)
Monocytes Relative: 5.8 % (ref 3.0–12.0)
Neutro Abs: 4.3 10*3/uL (ref 1.4–7.7)
Platelets: 372 10*3/uL (ref 150.0–400.0)
RDW: 14.5 % (ref 11.5–14.6)

## 2013-08-03 LAB — BRAIN NATRIURETIC PEPTIDE: Pro B Natriuretic peptide (BNP): 40 pg/mL (ref 0.0–100.0)

## 2013-08-03 NOTE — Telephone Encounter (Signed)
Spoke to pt she is aware she will need to stay over at the sleep center on 08/29/13 to do a MSLT study after her sleep study the night before per dr Reginia Naas instructions Judy [name]

## 2013-08-03 NOTE — Telephone Encounter (Signed)
Pt called stating that her ankles have been swollen advised pt to be seen in the ED

## 2013-08-03 NOTE — Patient Instructions (Addendum)
Please see patient coordinator before you leave today  to schedule echocardiogram  Please remember to go to the lab and x-ray department downstairs for your tests - we will call you with the results when they are available.  Add hctz back daily until we figure out whether that it's helping

## 2013-08-03 NOTE — Telephone Encounter (Signed)
I spoke with pt. She is scheduled to see MW this AM for appt. Nothing further needed

## 2013-08-03 NOTE — Progress Notes (Signed)
  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    No obvious daytime variabilty or assoc chronic cough or cp or chest tightness, subjective wheeze overt sinus or hb symptoms. No unusual exp hx or h/o childhood pna/ asthma or knowledge of premature birth.   Sleeping ok without nocturnal  or early am exacerbation  of respiratory  c/o's or need for noct saba. Also denies any obvious fluctuation of symptoms with weather or environmental changes or other aggravating or alleviating factors except as outlined above   Current Medications, Allergies, Past Medical History, Past Surgical History, Family History, and Social History were reviewed in [employer] record.     Review of Systems  Constitutional: Negative for fever, chills and unexpected weight change.  HENT: Negative for ear pain, nosebleeds, congestion, sore throat, rhinorrhea, sneezing, trouble swallowing, dental problem, voice change, postnasal drip and sinus pressure.   Eyes: Negative for visual disturbance.  Respiratory: Positive for shortness of breath. Negative for cough and choking.   Cardiovascular: Positive for leg swelling. Negative for chest pain.  Gastrointestinal: Negative for vomiting, abdominal pain and diarrhea.  Genitourinary: Negative for difficulty urinating.  Musculoskeletal: Negative for arthralgias.  Skin: Negative for rash.  Neurological: Negative for tremors, syncope and headaches.  Hematological: Does not bruise/bleed easily.       Objective:   Physical Exam  amb bf nad Wt  Readings from Last 3 Encounters:  08/03/13 249 lb (112.946 kg)  07/19/13 248 lb (112.492 kg)  06/29/13 242 lb (109.77 kg)     HEENT: nl dentition, turbinates, and orophanx. Nl external ear canals without cough reflex   NECK :  without JVD/Nodes/TM/ nl carotid upstrokes bilaterally   LUNGS: no acc muscle use, clear to A and P bilaterally without cough on insp or exp maneuvers   CV:  RRR  no s3 or murmur or increase in P2, no edema   ABD:  soft and nontender with nl excursion in the supine position. No bruits or organomegaly, bowel sounds nl  MS:  warm without deformities, calf tenderness, cyanosis or clubbing  SKIN: warm and dry without lesions    NEURO:  alert, approp, no deficits      CXR  08/03/2013 :  1. No radiographic evidence of acute cardiopulmonary disease.      Assessment & Plan:

## 2013-08-04 ENCOUNTER — Encounter (INDEPENDENT_AMBULATORY_CARE_PROVIDER_SITE_OTHER): Payer: Federal, State, Local not specified - PPO

## 2013-08-04 ENCOUNTER — Telehealth: Payer: Self-pay | Admitting: Internal Medicine

## 2013-08-04 DIAGNOSIS — R609 Edema, unspecified: Secondary | ICD-10-CM

## 2013-08-04 DIAGNOSIS — R0602 Shortness of breath: Secondary | ICD-10-CM

## 2013-08-04 DIAGNOSIS — R0600 Dyspnea, unspecified: Secondary | ICD-10-CM

## 2013-08-04 NOTE — Progress Notes (Signed)
Quick Note:  Spoke with pt and notified of results per Dr. Wert. Pt verbalized understanding and denied any questions.  ______ 

## 2013-08-04 NOTE — Telephone Encounter (Signed)
Judy [name]  Call pt and check on how she is doing.  If she wants to see me today add her in and we will get an EKG

## 2013-08-04 NOTE — Telephone Encounter (Signed)
Spoke with pt about her visit with pulmonology pt states that she has several appts scheduled.

## 2013-08-05 ENCOUNTER — Encounter: Payer: Self-pay | Admitting: Internal Medicine

## 2013-08-05 NOTE — Assessment & Plan Note (Signed)
-   08/03/2013  Walked RA x 3 laps @ 185 ft each stopped due to end of study, min sob, sat 90   Concerned about evolving R ht failure in setting of remote pet, obesity with osa  W/u should include v/q scan, venous dopplers and echocardiogram   See instructions for specific recommendations which were reviewed directly with the patient who was given a copy with highlighter outlining the key components.

## 2013-08-10 ENCOUNTER — Telehealth: Payer: Self-pay | Admitting: Internal Medicine

## 2013-08-10 NOTE — Progress Notes (Signed)
Quick Note:  LMOM TCB x2. No VQ is scheduled, Echo is scheduled for 08/15/13. ______

## 2013-08-10 NOTE — Telephone Encounter (Signed)
Pt aware of results 

## 2013-08-10 NOTE — Progress Notes (Signed)
Quick Note:  LMOM TCB x2. ______ 

## 2013-08-15 ENCOUNTER — Other Ambulatory Visit: Payer: Self-pay | Admitting: Internal Medicine

## 2013-08-15 ENCOUNTER — Telehealth: Payer: Self-pay | Admitting: *Deleted

## 2013-08-15 ENCOUNTER — Other Ambulatory Visit: Payer: Self-pay

## 2013-08-15 ENCOUNTER — Telehealth: Payer: Self-pay | Admitting: Pulmonary Disease

## 2013-08-15 ENCOUNTER — Ambulatory Visit (HOSPITAL_COMMUNITY): Payer: Federal, State, Local not specified - PPO | Attending: Internal Medicine

## 2013-08-15 ENCOUNTER — Encounter: Payer: Self-pay | Admitting: Internal Medicine

## 2013-08-15 DIAGNOSIS — R609 Edema, unspecified: Secondary | ICD-10-CM

## 2013-08-15 DIAGNOSIS — R5383 Other fatigue: Secondary | ICD-10-CM | POA: Insufficient documentation

## 2013-08-15 DIAGNOSIS — R5381 Other malaise: Secondary | ICD-10-CM | POA: Insufficient documentation

## 2013-08-15 DIAGNOSIS — R0609 Other forms of dyspnea: Secondary | ICD-10-CM | POA: Insufficient documentation

## 2013-08-15 DIAGNOSIS — R0600 Dyspnea, unspecified: Secondary | ICD-10-CM

## 2013-08-15 DIAGNOSIS — R0602 Shortness of breath: Secondary | ICD-10-CM

## 2013-08-15 DIAGNOSIS — I079 Rheumatic tricuspid valve disease, unspecified: Secondary | ICD-10-CM | POA: Insufficient documentation

## 2013-08-15 DIAGNOSIS — I059 Rheumatic mitral valve disease, unspecified: Secondary | ICD-10-CM | POA: Insufficient documentation

## 2013-08-15 DIAGNOSIS — I379 Nonrheumatic pulmonary valve disorder, unspecified: Secondary | ICD-10-CM | POA: Insufficient documentation

## 2013-08-15 DIAGNOSIS — R0989 Other specified symptoms and signs involving the circulatory and respiratory systems: Secondary | ICD-10-CM | POA: Insufficient documentation

## 2013-08-15 NOTE — Telephone Encounter (Signed)
Ahsha need a new order for Physical Therapy. The order timed out due to therapist being out of town and then [place] being in [place].

## 2013-08-15 NOTE — Progress Notes (Signed)
Echocardiogram performed.  

## 2013-08-15 NOTE — Telephone Encounter (Signed)
Reviewed records from [facility] -PFTs incl methacholine challenge nml in 2005 CT chest - BL bochdalek hernias, pulm nodules stable

## 2013-08-16 ENCOUNTER — Other Ambulatory Visit: Payer: Self-pay | Admitting: *Deleted

## 2013-08-16 DIAGNOSIS — R32 Unspecified urinary incontinence: Secondary | ICD-10-CM

## 2013-08-18 ENCOUNTER — Ambulatory Visit: Payer: Federal, State, Local not specified - PPO | Attending: Internal Medicine | Admitting: Physical Therapy

## 2013-08-18 DIAGNOSIS — M542 Cervicalgia: Secondary | ICD-10-CM | POA: Insufficient documentation

## 2013-08-18 DIAGNOSIS — M2420 Disorder of ligament, unspecified site: Secondary | ICD-10-CM | POA: Insufficient documentation

## 2013-08-18 DIAGNOSIS — M545 Low back pain, unspecified: Secondary | ICD-10-CM | POA: Insufficient documentation

## 2013-08-18 DIAGNOSIS — IMO0001 Reserved for inherently not codable concepts without codable children: Secondary | ICD-10-CM | POA: Insufficient documentation

## 2013-08-18 DIAGNOSIS — M629 Disorder of muscle, unspecified: Secondary | ICD-10-CM | POA: Insufficient documentation

## 2013-08-19 ENCOUNTER — Ambulatory Visit (HOSPITAL_COMMUNITY)
Admission: RE | Admit: 2013-08-19 | Discharge: 2013-08-19 | Disposition: A | Discharge status: Home / Self Care | Payer: Federal, State, Local not specified - PPO | Source: Ambulatory Visit | Attending: Internal Medicine | Admitting: Internal Medicine

## 2013-08-19 ENCOUNTER — Encounter (HOSPITAL_COMMUNITY)
Admission: RE | Admit: 2013-08-19 | Discharge: 2013-08-19 | Disposition: A | Discharge status: Home / Self Care | Payer: Federal, State, Local not specified - PPO | Source: Ambulatory Visit | Attending: Internal Medicine | Admitting: Internal Medicine

## 2013-08-19 ENCOUNTER — Encounter: Payer: Self-pay | Admitting: Internal Medicine

## 2013-08-19 DIAGNOSIS — R0609 Other forms of dyspnea: Secondary | ICD-10-CM | POA: Insufficient documentation

## 2013-08-19 DIAGNOSIS — Z86711 Personal history of pulmonary embolism: Secondary | ICD-10-CM | POA: Insufficient documentation

## 2013-08-19 DIAGNOSIS — R0989 Other specified symptoms and signs involving the circulatory and respiratory systems: Secondary | ICD-10-CM | POA: Insufficient documentation

## 2013-08-19 DIAGNOSIS — R0600 Dyspnea, unspecified: Secondary | ICD-10-CM

## 2013-08-19 MED ORDER — TECHNETIUM TC 99M DIETHYLENETRIAME-PENTAACETIC ACID: 44.0000 | Freq: Once | INTRAVENOUS | Status: AC | PRN

## 2013-08-19 MED ORDER — TECHNETIUM TO 99M ALBUMIN AGGREGATED
5.6000 | Freq: Once | INTRAVENOUS | Status: AC | PRN
  Administered 2013-08-19: 5.6 via INTRAVENOUS

## 2013-08-22 ENCOUNTER — Encounter: Payer: Self-pay | Admitting: Internal Medicine

## 2013-08-22 ENCOUNTER — Ambulatory Visit (INDEPENDENT_AMBULATORY_CARE_PROVIDER_SITE_OTHER): Payer: Federal, State, Local not specified - PPO | Admitting: Internal Medicine

## 2013-08-22 ENCOUNTER — Ambulatory Visit: Payer: Federal, State, Local not specified - PPO | Admitting: Physical Therapy

## 2013-08-22 VITALS — BP 122/76 | HR 76 | Temp 98.0°F | Resp 18 | Wt 245.0 lb

## 2013-08-22 DIAGNOSIS — M549 Dorsalgia, unspecified: Secondary | ICD-10-CM

## 2013-08-22 DIAGNOSIS — IMO0002 Reserved for concepts with insufficient information to code with codable children: Secondary | ICD-10-CM | POA: Insufficient documentation

## 2013-08-22 HISTORY — DX: Reserved for concepts with insufficient information to code with codable children: IMO0002

## 2013-08-22 MED ORDER — METHYLPREDNISOLONE ACETATE 80 MG/ML IJ SUSP
120.0000 mg | Freq: Once | INTRAMUSCULAR | Status: AC
  Administered 2013-08-22: 120 mg via INTRAMUSCULAR

## 2013-08-22 MED ORDER — PREDNISONE 20 MG PO TABS: ORAL_TABLET | ORAL | Status: DC

## 2013-08-22 MED ORDER — OXYCODONE-ACETAMINOPHEN 7.5-325 MG PO TABS: 1.0000 | ORAL_TABLET | ORAL | Status: DC | PRN

## 2013-08-22 MED ORDER — CYCLOBENZAPRINE HCL 10 MG PO TABS: 10.0000 mg | ORAL_TABLET | Freq: Three times a day (TID) | ORAL | Status: DC | PRN

## 2013-08-22 NOTE — Patient Instructions (Signed)
Call Dr. Norville Haggard for evaluation   If any worsening symptoms she is to call office

## 2013-08-22 NOTE — Progress Notes (Signed)
  Subjective:    Patient ID: Judy [name], female    DOB: [birth year], 62 y.o.   MRN: [id number]  HPI  Judy [name] is here for acute visit.   She has had worsening lumbar back pain last several days.  She tripped on Friday , did not fall but twisted her back  .  See MRI  Of 03/2013.    She did have a steroid injection back this past May  (Dr. 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  She tells me she has had foot drop in the past.  She wants to try to hold off on surgery as long as possible  She denies paresthesias down either leg,  No musle weakness in either leg.    Review of Systems See HPI    Objective:   Physical Exam Physical Exam  Nursing note and vitals reviewed.  Constitutional: She is oriented to person, place, and time. She appears well-developed and well-nourished.  HENT:  Head: Normocephalic and atraumatic.  Cardiovascular: Normal rate and regular rhythm. Exam reveals no gallop and no friction rub.  No murmur heard.  Pulmonary/Chest: Breath sounds normal. She has no wheezes. She has no rales.  Neurological: She is alert and oriented to person, place, and time.  Le:   Reflexes  1+ symmetric SLR pos bilaterally Motor 5/5 but report of pain Sensory intact to microfilament.  Skin: Skin is warm and dry.  Psychiatric: She has a normal mood and affect. Her behavior is normal.        Assessment & Plan:  Acute exacerbation lumbar pain  History of HNP:    Will give percocet and flexeril with 120 mg depomedrol in office.  Advised to call Dr. Norville Haggard for eval for steroid injection    Pt advised if any paresthesias,  Muscle weakness, or foot dragging she is to call the office

## 2013-08-28 ENCOUNTER — Ambulatory Visit (HOSPITAL_BASED_OUTPATIENT_CLINIC_OR_DEPARTMENT_OTHER): Payer: Federal, State, Local not specified - PPO | Attending: Pulmonary Disease | Admitting: Radiology

## 2013-08-28 VITALS — Ht 68.0 in | Wt 245.0 lb

## 2013-08-28 DIAGNOSIS — G4733 Obstructive sleep apnea (adult) (pediatric): Secondary | ICD-10-CM | POA: Insufficient documentation

## 2013-08-29 ENCOUNTER — Encounter (HOSPITAL_BASED_OUTPATIENT_CLINIC_OR_DEPARTMENT_OTHER): Payer: Federal, State, Local not specified - PPO

## 2013-08-29 ENCOUNTER — Ambulatory Visit: Payer: Federal, State, Local not specified - PPO | Admitting: Physical Therapy

## 2013-08-29 DIAGNOSIS — G4710 Hypersomnia, unspecified: Secondary | ICD-10-CM

## 2013-08-29 DIAGNOSIS — G4730 Sleep apnea, unspecified: Secondary | ICD-10-CM

## 2013-08-30 ENCOUNTER — Encounter: Payer: Self-pay | Admitting: Pulmonary Disease

## 2013-08-30 ENCOUNTER — Ambulatory Visit (INDEPENDENT_AMBULATORY_CARE_PROVIDER_SITE_OTHER): Payer: Federal, State, Local not specified - PPO | Admitting: Pulmonary Disease

## 2013-08-30 VITALS — BP 122/76 | HR 86 | Temp 98.0°F | Ht 67.25 in | Wt 243.0 lb

## 2013-08-30 DIAGNOSIS — G4733 Obstructive sleep apnea (adult) (pediatric): Secondary | ICD-10-CM

## 2013-08-30 DIAGNOSIS — R0609 Other forms of dyspnea: Secondary | ICD-10-CM

## 2013-08-30 DIAGNOSIS — R0600 Dyspnea, unspecified: Secondary | ICD-10-CM

## 2013-08-30 NOTE — Patient Instructions (Addendum)
We will set you up with autosettings on CPAP

## 2013-08-30 NOTE — Assessment & Plan Note (Signed)
Rpt echo in 1 yr

## 2013-08-30 NOTE — Progress Notes (Signed)
  Subjective:    Patient ID: Judy [name], female    DOB: August 11, 1951, 62 y.o.   MRN: [id number]  HPI   PCP- Schoenhoff   62 year old never smoker  for FU of obstructive sleep apnea.  She also has hypertension , hyperlipidemia, chronic back pain and a remote history of PE.  She underwent an initial polysomnogram in [place] in 2009- PSG 7/09 -260 lbs - AHI 8/h corrected by nasal CPAP 12 cm.  and was told that she had REM-related OSA in the early morning, her repeat polysomnogram in 2012 at [facility] showed resolution of OSA, hence CPAP was discontinued. 11/12 AHI 5.6/h -wt 235 lbs, REM AHI 17/h (95 mins). She had lost 25 pounds in the interim but now symptoms seem to have surfaced again.   08/30/2013 08/03/2013 Consultation re sob x 6 weeks had been working out with trainer 3-5 x per week on treadmill and gradually losing ground and found gradually able to do less in same time Assoc with leg swelling L > R does vary some. Mild desatn to 90% on walking  v/q scan, venous dopplers neg  echocardiogram -nml LVEF, RVSP 37  Pt had sleep study 08/28/13. Pt has loaner machine from [facility]. Has not had download done off machine yet. She was travelling to [place] & may have damaged the lonaer machine Remains very sleepy, active lifestyle - can swim x 1hr & can walk 7 mi  Review of Systems neg for any significant sore throat, dysphagia, itching, sneezing, nasal congestion or excess/ purulent secretions, fever, chills, sweats, unintended wt loss, pleuritic or exertional cp, hempoptysis, orthopnea pnd or change in chronic leg swelling. Also denies presyncope, palpitations, heartburn, abdominal pain, nausea, vomiting, diarrhea or change in bowel or urinary habits, dysuria,hematuria, rash, arthralgias, visual complaints, headache, numbness weakness or ataxia.     Objective:   Physical Exam  Gen. Pleasant, obese, in no distress ENT - no lesions, no post nasal drip Neck: No JVD, no thyromegaly, no carotid  bruits Lungs: no use of accessory muscles, no dullness to percussion, decreased without rales or rhonchi  Cardiovascular: Rhythm regular, heart sounds  normal, no murmurs or gallops, no peripheral edema Musculoskeletal: No deformities, no cyanosis or clubbing , no tremors       Assessment & Plan:

## 2013-08-30 NOTE — Procedures (Signed)
[name], Judy [name] NO.:  [id number]  MEDICAL RECORD NO.:  [id number]          PATIENT TYPE:  OUT  LOCATION:  SLEEP CENTER                 FACILITY:  [facility]  PHYSICIAN:  Oretha Milch, MD      DATE OF BIRTH:  1951-08-24  DATE OF STUDY:  08/29/2013                           NOCTURNAL POLYSOMNOGRAM  REFERRING PHYSICIAN:  Kendrick Ranch, M.D.  INDICATION FOR THE STUDY:  Judy [name] is a 62 year old who has moved from [place] to [place].  She had a study in 2006 and in 2012 at the [place] [facility] [place] that showed obstructive sleep apnea.  She was able to get off CPAP when she lost weight, but has gained some weight again.  At the time of this study, she weighed 245 pounds with a height of 5 feet 8 inches, BMI of 37, neck size of 15 inches.  EPWORTH SLEEPINESS SCORE:  11.  This nocturnal polysomnogram was performed with a sleep technologist in attendance.  EEG, EOG, EMG, EKG, and respiratory parameters were recorded.  Sleep stages, arousals, limb movements, respiratory data were scored according to criteria laid out by the American Academy of Sleep Medicine.   SLEEP ARCHITECTURE:  Lights out was at 10:49 p.m.  Lights on was at 5:03 a.m.  Total sleep time was 335 minutes with a sleep period time of 348 minutes and a sleep efficiency of 90%.  Sleep latency was 25 minutes, latency to REM sleep was 76 minutes, and wake after sleep onset was 13 minutes.  Sleep stages as a percentage of total sleep time was N1 2%, N2 70%, N3 0%, and REM sleep 28% (94 minutes).  REM sleep was noted in stages with the longest around 3 a.m., supine REM sleep was noted for 32 minutes.  Arousal Data:  There were total of 38 arousals with an arousal index of 7 events per hour.  Of these, 21 were spontaneous and the rest were associated with respiratory events.  RESPIRATORY DATA:  There were total of 31 obstructive apnea, 0 central apnea, 0 mixed apneas, and 47 hypopneas  with an apnea-hypopnea index of 14 events per hour.  Five RERAs were noted with an RDI of 15 events per hour.  The longest hypopnea was 56 seconds.  Most events were noted during REM and supine sleep.  OXYGEN DATA:  The desaturation index was 12 events per hour.  The lowest desaturation was 83%.  She spent 1.5 minutes with a saturation less than 88%.  CARDIAC DATA:  The low heart rate was 50 beats per minute.  The high heart rate was 126 beats per minute.  No arrhythmias were noted.  MOVEMENT-PARASOMNIA:  The PLM index was 12 events per hour.  The PLM arousal index was only 0.2 events per hour.  Discussion:  She was desensitized with a medium full-face mask, but did not meet criteria for split intervention.  IMPRESSIONS-RECOMMENDATIONS: 1. Mild obstructive sleep apnea, predominantly during REM supine sleep     with hypopneas causing sleep fragmentation and oxygen desaturation 2. No evidence of cardiac arrhythmias, significant limb movements or     behavioral disturbance during sleep.  RECOMMENDATIONS:  1. Treatment options for this degree of sleep-disordered breathing     include weight loss alone and/or with CPAP therapy or oral     appliance 2. She should be cautioned against driving when sleepy.  She should be     asked to avoid medications with sedative side effects.     Oretha Milch, MD    RVA/MEDQ  D:  08/29/2013 12:45:18  T:  08/30/2013 02:37:53  Job:  [id number]

## 2013-08-30 NOTE — Assessment & Plan Note (Signed)
We will set you up with autosettings on CPAP Main reason to treat is because she is symptomatic  Weight loss encouraged, compliance with goal of at least 4-6 hrs every night is the expectation. Advised against medications with sedative side effects Cautioned against driving when sleepy - understanding that sleepiness will vary on a day to day basis

## 2013-08-31 ENCOUNTER — Other Ambulatory Visit: Payer: Self-pay | Admitting: Internal Medicine

## 2013-08-31 NOTE — Telephone Encounter (Signed)
Refill request

## 2013-09-05 ENCOUNTER — Other Ambulatory Visit: Payer: Self-pay | Admitting: *Deleted

## 2013-09-05 ENCOUNTER — Ambulatory Visit: Payer: Federal, State, Local not specified - PPO | Admitting: Physical Therapy

## 2013-09-05 DIAGNOSIS — M545 Low back pain, unspecified: Secondary | ICD-10-CM

## 2013-09-13 ENCOUNTER — Ambulatory Visit: Payer: Federal, State, Local not specified - PPO | Admitting: Physical Therapy

## 2013-09-13 ENCOUNTER — Ambulatory Visit: Payer: Federal, State, Local not specified - PPO | Attending: Internal Medicine | Admitting: Physical Therapy

## 2013-09-13 DIAGNOSIS — IMO0001 Reserved for inherently not codable concepts without codable children: Secondary | ICD-10-CM | POA: Insufficient documentation

## 2013-09-13 DIAGNOSIS — M545 Low back pain, unspecified: Secondary | ICD-10-CM | POA: Insufficient documentation

## 2013-09-13 DIAGNOSIS — M542 Cervicalgia: Secondary | ICD-10-CM | POA: Insufficient documentation

## 2013-09-13 DIAGNOSIS — M2420 Disorder of ligament, unspecified site: Secondary | ICD-10-CM | POA: Insufficient documentation

## 2013-09-13 DIAGNOSIS — M629 Disorder of muscle, unspecified: Secondary | ICD-10-CM | POA: Insufficient documentation

## 2013-09-14 ENCOUNTER — Ambulatory Visit: Payer: Federal, State, Local not specified - PPO | Admitting: Physical Therapy

## 2013-09-21 ENCOUNTER — Telehealth: Payer: Self-pay | Admitting: *Deleted

## 2013-09-21 NOTE — Telephone Encounter (Signed)
Advised pt to use OTC imodium for flight appt made for 3 pm 10/16 with Dr. Brayton Caves

## 2013-09-22 ENCOUNTER — Ambulatory Visit (INDEPENDENT_AMBULATORY_CARE_PROVIDER_SITE_OTHER): Payer: Federal, State, Local not specified - PPO | Admitting: Physician Assistant

## 2013-09-22 ENCOUNTER — Ambulatory Visit: Payer: Federal, State, Local not specified - PPO | Admitting: Physical Therapy

## 2013-09-22 ENCOUNTER — Encounter: Payer: Self-pay | Admitting: Physician Assistant

## 2013-09-22 VITALS — BP 142/92 | HR 64 | Temp 98.0°F | Resp 18 | Ht 67.25 in | Wt 249.5 lb

## 2013-09-22 DIAGNOSIS — R197 Diarrhea, unspecified: Secondary | ICD-10-CM

## 2013-09-22 HISTORY — DX: Diarrhea, unspecified: R19.7

## 2013-09-22 LAB — CBC WITH DIFFERENTIAL/PLATELET
Basophils Absolute: 0 10*3/uL (ref 0.0–0.1)
Eosinophils Relative: 2 % (ref 0–5)
HCT: 35.8 % — ABNORMAL LOW (ref 36.0–46.0)
Lymphocytes Relative: 35 % (ref 12–46)
Lymphs Abs: 2.6 10*3/uL (ref 0.7–4.0)
MCV: 83.4 fL (ref 78.0–100.0)
Monocytes Absolute: 0.4 10*3/uL (ref 0.1–1.0)
Neutro Abs: 4.4 10*3/uL (ref 1.7–7.7)
RBC: 4.29 MIL/uL (ref 3.87–5.11)
RDW: 14 % (ref 11.5–15.5)
WBC: 7.5 10*3/uL (ref 4.0–10.5)

## 2013-09-22 NOTE — Assessment & Plan Note (Addendum)
Most likely viral gastroenteritis or food-ingestion related.  Increase fluids.  BRAT diet. Daily probiotic.  Immodium if needed.  CBC, BMP.  Discussed other possible causes of diarrhea and possible need for stool culture, O&P, but patient wishes to hold off at present time.

## 2013-09-22 NOTE — Patient Instructions (Signed)
Please obtain labs.  I will call you with results.  Please increase fluid intake.  Read information below on BRAT diet.  Continue Immodium as needed.  Please call if symptoms are not improving.  B.R.A.T. Diet Your doctor has recommended the B.R.A.T. diet for you or your child until the condition improves. This is often used to help control diarrhea and vomiting symptoms. If you or your child can tolerate clear liquids, you may have:  Bananas.   Rice.   Applesauce.   Toast (and other simple starches such as crackers, potatoes, noodles).  Be sure to avoid dairy products, meats, and fatty foods until symptoms are better. Fruit juices such as apple, grape, and prune juice can make diarrhea worse. Avoid these. Continue this diet for 2 days or as instructed by your caregiver. Document Released: 11/24/2005 Document Revised: 11/13/2011 Document Reviewed: 05/13/2007 [facility] Patient Information 2012 [place], [place].

## 2013-09-22 NOTE — Progress Notes (Signed)
Patient ID: Judy [name], female   DOB: 1951-11-17, 62 y.o.   MRN: [id number]  Patient presents to clinic today c/o frequent loose stools over the past 3 days.  Patient states that she first noticed her symptoms at the end of her trip to [facility].  She denies any foreign water supplies or exotic foods, but states she ate a buffet the day her symptoms started.  Denies recent antibiotic use. Denies fever, chills, fatigue, nausea, vomiting, abdominal pain, tenesmus, melena.  Endorses one episode of bright red blood on toilet paper.  Denies history of GI bleed or excess NSAID use.  Has Rx for Celebrex but has not taken it since onset of symptoms. Does endorse feeling like her mouth is very dry.  States her symptoms have been improving over the past couple of days and the diarrhea is much better today.  Has taken some Immodium yesterday.  Patient denies daily fiber supplement or probiotic.   Past Medical History  Diagnosis Date  . Hypertension   . Dysplasia of cervix   . GERD (gastroesophageal reflux disease)   . Hyperlipidemia   . Obesity   . Pulmonary embolism   . OSA (obstructive sleep apnea)     Current Outpatient Prescriptions on File Prior to Visit  Medication Sig Dispense Refill  . aspirin 325 MG EC tablet Take 325 mg by mouth daily.      . candesartan-hydrochlorothiazide (ATACAND HCT) 16-12.5 MG per tablet Take 1 tablet by mouth daily.      . celecoxib (CELEBREX) 200 MG capsule Take 1 capsule (200 mg total) by mouth daily.  30 capsule  2  . Cholecalciferol (VITAMIN D) 1000 UNITS capsule Take 1,000 Units by mouth daily.      . cyclobenzaprine (FLEXERIL) 10 MG tablet TAKE 1 TABLET BY MOUTH THREE TIMES DAILY AS NEEDED  90 tablet  0  . lansoprazole (PREVACID) 30 MG capsule Take 30 mg by mouth daily.      Marland Kitchen oxyCODONE-acetaminophen (PERCOCET) 7.5-325 MG per tablet Take 1 tablet by mouth every 4 (four) hours as needed for pain.  30 tablet  0   No current facility-administered medications on file  prior to visit.    Allergies  Allergen Reactions  . Ivp Dye [Iodinated Diagnostic Agents] Swelling    Family History  Problem Relation Age of Onset  . Lung cancer Mother     was a smoker  . Hypertension Mother   . Breast cancer Sister     x2  . Diabetes Maternal Grandmother   . Heart disease Son     congenital    History   Social History  . Marital Status: Married    Spouse Name: N/A    Number of Children: 2  . Years of Education: N/A   Occupational History  . consultant for women    Social History Main Topics  . Smoking status: Never Smoker   . Smokeless tobacco: Never Used  . Alcohol Use: Yes     Comment: rarely-only holidays  . Drug Use: No  . Sexual Activity: No   Other Topics Concern  . None   Social History Narrative  . None   ROS See HPI.  All other ROS are negative.   Filed Vitals:   09/22/13 1414  BP: 142/92  Pulse: 64  Temp: 98 F (36.7 C)  Resp: 18    Physical Exam  Vitals reviewed. Constitutional: She is oriented to person, place, and time and well-developed, well-nourished, and in  no distress.  HENT:  Head: Normocephalic and atraumatic.  Right Ear: External ear normal.  Left Ear: External ear normal.  Nose: Nose normal.  Mouth/Throat: Oropharynx is clear and moist. No oropharyngeal exudate.  Chapped lips noted on exam.  Eyes: Conjunctivae are normal.  Neck: Neck supple.  Cardiovascular: Normal rate, regular rhythm, normal heart sounds and intact distal pulses.   Pulmonary/Chest: Effort normal and breath sounds normal. No respiratory distress. She has no wheezes. She has no rales. She exhibits no tenderness.  Abdominal: Soft. Bowel sounds are normal. She exhibits no distension and no mass. There is no rebound and no guarding.  Mild diffuse tenderness to palpation.  Lymphadenopathy:    She has no cervical adenopathy.  Neurological: She is alert and oriented to person, place, and time.  Skin: Skin is warm and dry. No rash noted.   Psychiatric: Affect normal.     Recent Results (from the past 2160 hour(s))  BASIC METABOLIC PANEL     Status: None   Collection Time    08/03/13 11:54 AM      Result Value Range   Sodium 137  135 - 145 mEq/L   Potassium 3.8  3.5 - 5.1 mEq/L   Chloride 101  96 - 112 mEq/L   CO2 29  19 - 32 mEq/L   Glucose, Bld 92  70 - 99 mg/dL   BUN 12  6 - 23 mg/dL   Creatinine, Ser 0.9  0.4 - 1.2 mg/dL   Calcium 8.9  8.4 - 16.1 mg/dL   GFR 09.60  >45.40 mL/min  CBC WITH DIFFERENTIAL     Status: None   Collection Time    08/03/13 11:54 AM      Result Value Range   WBC 7.1  4.5 - 10.5 K/uL   RBC 4.39  3.87 - 5.11 Mil/uL   Hemoglobin 12.3  12.0 - 15.0 g/dL   HCT 98.1  19.1 - 47.8 %   MCV 82.6  78.0 - 100.0 fl   MCHC 33.8  30.0 - 36.0 g/dL   RDW 29.5  62.1 - 30.8 %   Platelets 372.0  150.0 - 400.0 K/uL   Neutrophils Relative % 60.8  43.0 - 77.0 %   Lymphocytes Relative 30.9  12.0 - 46.0 %   Monocytes Relative 5.8  3.0 - 12.0 %   Eosinophils Relative 1.9  0.0 - 5.0 %   Basophils Relative 0.6  0.0 - 3.0 %   Neutro Abs 4.3  1.4 - 7.7 K/uL   Lymphs Abs 2.2  0.7 - 4.0 K/uL   Monocytes Absolute 0.4  0.1 - 1.0 K/uL   Eosinophils Absolute 0.1  0.0 - 0.7 K/uL   Basophils Absolute 0.0  0.0 - 0.1 K/uL  BRAIN NATRIURETIC PEPTIDE     Status: None   Collection Time    08/03/13 11:54 AM      Result Value Range   Pro B Natriuretic peptide (BNP) 40.0  0.0 - 100.0 pg/mL  D-DIMER, QUANTITATIVE     Status: Abnormal   Collection Time    08/03/13 11:54 AM      Result Value Range   D-Dimer, Quant 0.64 (*) 0.00 - 0.48 ug/mL-FEU   Comment: At the inhouse established cutoff value of 0.48 ug/mL FEU, this     methology has been documented in the literature to have a sensitivity     and negative predictive value of at least 98-99%.  The test result     should  be correlated with an assessment of the clinical probability of     DVT/VTE.    Assessment/Plan: Diarrhea Most likely viral gastroenteritis or  food-ingestion related.  Increase fluids.  BRAT diet. Daily probiotic.  Immodium if needed.  CBC, BMP.  Discussed other possible causes of diarrhea and possible need for stool culture, O&P, but patient wishes to hold off at present time.

## 2013-09-23 LAB — BASIC METABOLIC PANEL
BUN: 16 mg/dL (ref 6–23)
CO2: 30 mEq/L (ref 19–32)
Calcium: 9.5 mg/dL (ref 8.4–10.5)
Creat: 0.82 mg/dL (ref 0.50–1.10)
Glucose, Bld: 92 mg/dL (ref 70–99)
Potassium: 3.8 mEq/L (ref 3.5–5.3)

## 2013-09-26 ENCOUNTER — Ambulatory Visit: Payer: Federal, State, Local not specified - PPO | Admitting: Physical Therapy

## 2013-10-04 ENCOUNTER — Ambulatory Visit: Payer: Federal, State, Local not specified - PPO | Admitting: Physical Therapy

## 2013-10-06 ENCOUNTER — Ambulatory Visit: Payer: Federal, State, Local not specified - PPO | Admitting: Physical Therapy

## 2013-10-19 ENCOUNTER — Ambulatory Visit: Payer: Federal, State, Local not specified - PPO | Attending: Internal Medicine | Admitting: Physical Therapy

## 2013-10-19 DIAGNOSIS — M542 Cervicalgia: Secondary | ICD-10-CM | POA: Insufficient documentation

## 2013-10-19 DIAGNOSIS — M2420 Disorder of ligament, unspecified site: Secondary | ICD-10-CM | POA: Insufficient documentation

## 2013-10-19 DIAGNOSIS — M629 Disorder of muscle, unspecified: Secondary | ICD-10-CM | POA: Insufficient documentation

## 2013-10-19 DIAGNOSIS — IMO0001 Reserved for inherently not codable concepts without codable children: Secondary | ICD-10-CM | POA: Insufficient documentation

## 2013-10-19 DIAGNOSIS — M545 Low back pain, unspecified: Secondary | ICD-10-CM | POA: Insufficient documentation

## 2013-10-25 ENCOUNTER — Encounter: Payer: Self-pay | Admitting: Internal Medicine

## 2013-10-25 ENCOUNTER — Ambulatory Visit (INDEPENDENT_AMBULATORY_CARE_PROVIDER_SITE_OTHER): Payer: Federal, State, Local not specified - PPO | Admitting: Internal Medicine

## 2013-10-25 VITALS — BP 149/83 | HR 96 | Temp 98.1°F | Resp 18 | Wt 247.0 lb

## 2013-10-25 DIAGNOSIS — K439 Ventral hernia without obstruction or gangrene: Secondary | ICD-10-CM

## 2013-10-25 DIAGNOSIS — IMO0002 Reserved for concepts with insufficient information to code with codable children: Secondary | ICD-10-CM

## 2013-10-25 MED ORDER — OXYCODONE-ACETAMINOPHEN 7.5-325 MG PO TABS: 1.0000 | ORAL_TABLET | ORAL | Status: DC | PRN

## 2013-10-25 MED ORDER — CYCLOBENZAPRINE HCL 10 MG PO TABS: 10.0000 mg | ORAL_TABLET | Freq: Three times a day (TID) | ORAL | Status: DC | PRN

## 2013-10-25 NOTE — Patient Instructions (Signed)
Will set up referral to Dr. Abbey Chatters  Surgeon  See me as needed

## 2013-10-25 NOTE — Progress Notes (Signed)
Subjective:    Patient ID: Judy [name], female    DOB: January 26, 1951, 62 y.o.   MRN: [id number]  HPI  Judy [name] is here for acute visit.  She recently returned from [place]  She has completed PT and still has spasms of her thoracic back.  Lower back is better    She did have one steroid injection with Dr. Norville Haggard that helped.  MRI shows multilevel disc protrusions but she wishes to delay any surgery as long as possible  See Dr. Venetia Maxon and Dr. Clovis Pu scanned note.   She has a long standing ventral hernia that she is wanting an opinion of surgical repair will help her thoracic spasms.  She has an appt with Dr. Shon Hough in am. No GI symptoms ,  Having Daily Bm's no bowel problems  Allergies  Allergen Reactions  . Ivp Dye [Iodinated Diagnostic Agents] Swelling   Past Medical History  Diagnosis Date  . Hypertension   . Dysplasia of cervix   . GERD (gastroesophageal reflux disease)   . Hyperlipidemia   . Obesity   . Pulmonary embolism   . OSA (obstructive sleep apnea)    Past Surgical History  Procedure Laterality Date  . Vaginal hysterectomy    . Tonsillectomy    . Toe surgery      congenital  . Breast lumpectomy      axillary bilat  . Tummy tuck    . Inguinal hernia repair      bilat  . Injection knee      and back   History   Social History  . Marital Status: Married    Spouse Name: N/A    Number of Children: 2  . Years of Education: N/A   Occupational History  . consultant for women    Social History Main Topics  . Smoking status: Never Smoker   . Smokeless tobacco: Never Used  . Alcohol Use: Yes     Comment: rarely-only holidays  . Drug Use: No  . Sexual Activity: No   Other Topics Concern  . Not on file   Social History Narrative  . No narrative on file   Family History  Problem Relation Age of Onset  . Lung cancer Mother     was a smoker  . Hypertension Mother   . Breast cancer Sister     x2  . Diabetes Maternal Grandmother   . Heart disease  Son     congenital   Patient Active Problem List   Diagnosis Date Noted  . Diarrhea 09/22/2013  . Intervertebral disc protrusion 08/22/2013  . Pulmonary hypertension 08/03/2013  . Leg swelling 08/03/2013  . Lumbar spondylosis 07/03/2013  . History of hematuria 06/25/2013  . History of gastric polyp 06/25/2013  . Left knee pain 05/19/2013  . Insomnia 05/18/2013  . Abnormal CT of spine 03/28/2013  . Personal history of renal calculi 02/23/2013  . Axillary mass 02/07/2013  . Family history of breast cancer in sister 01/18/2013  . OSA (obstructive sleep apnea) 01/18/2013  . Personal history of colonic polyps 01/18/2013  . Gastric polyp 01/18/2013  . History of cervical dysplasia 06/15/2012  . OA (osteoarthritis) of knee 06/15/2012  . Seasonal depression 06/15/2012  . Menopause 06/15/2012  . Urinary incontinence 06/15/2012  . Ventral hernia 06/15/2012  . S/P hysterectomy 06/14/2012  . Essential hypertension, benign 06/14/2012  . Other and unspecified hyperlipidemia 06/14/2012  . GERD (gastroesophageal reflux disease) 06/14/2012   Current Outpatient Prescriptions on File Prior to Visit  Medication Sig Dispense Refill  . aspirin 325 MG EC tablet Take 325 mg by mouth daily.      . candesartan-hydrochlorothiazide (ATACAND HCT) 16-12.5 MG per tablet Take 1 tablet by mouth daily.      . celecoxib (CELEBREX) 200 MG capsule Take 1 capsule (200 mg total) by mouth daily.  30 capsule  2  . Cholecalciferol (VITAMIN D) 1000 UNITS capsule Take 1,000 Units by mouth daily.      . cyclobenzaprine (FLEXERIL) 10 MG tablet TAKE 1 TABLET BY MOUTH THREE TIMES DAILY AS NEEDED  90 tablet  0  . lansoprazole (PREVACID) 30 MG capsule Take 30 mg by mouth daily.      Marland Kitchen oxyCODONE-acetaminophen (PERCOCET) 7.5-325 MG per tablet Take 1 tablet by mouth every 4 (four) hours as needed for pain.  30 tablet  0   No current facility-administered medications on file prior to visit.      Review of Systems See  HPI    Objective:   Physical Exam Physical Exam  Nursing note and vitals reviewed.   Repeat BP    142/86 Constitutional: She is oriented to person, place, and time. She appears well-developed and well-nourished.  HENT:  Head: Normocephalic and atraumatic.  Cardiovascular: Normal rate and regular rhythm. Exam reveals no gallop and no friction rub.  No murmur heard.  Pulmonary/Chest: Breath sounds normal. She has no wheezes. She has no rales.  Abd  Large easily reducible upper ventral hernia.  BS pos  No pain to palpation in abd Neurological: She is alert and oriented to person, place, and time.  Skin: Skin is warm and dry.  Psychiatric: She has a normal mood and affect. Her behavior is normal.         Assessment & Plan:  Ventral hernia  Will refer to Dr. Abbey Chatters and she will also get opinion from  Dr. Shon Hough.   Multilevel disc protrusions  She does not wish surgery.    OK for percocet and flexeril that she uses on a prn basis.    HTN  Repeat BP at reasonable levels  See me as needed  She declines flu vaccine

## 2013-10-28 ENCOUNTER — Other Ambulatory Visit: Payer: Self-pay | Admitting: Internal Medicine

## 2013-11-01 NOTE — Telephone Encounter (Signed)
Refill request

## 2013-11-11 ENCOUNTER — Encounter (HOSPITAL_BASED_OUTPATIENT_CLINIC_OR_DEPARTMENT_OTHER): Payer: Self-pay | Admitting: *Deleted

## 2013-11-11 ENCOUNTER — Encounter (HOSPITAL_BASED_OUTPATIENT_CLINIC_OR_DEPARTMENT_OTHER)
Admission: RE | Admit: 2013-11-11 | Discharge: 2013-11-11 | Disposition: A | Discharge status: Home / Self Care | Payer: Federal, State, Local not specified - PPO | Source: Ambulatory Visit | Attending: Specialist | Admitting: Specialist

## 2013-11-11 ENCOUNTER — Encounter: Payer: Self-pay | Admitting: Internal Medicine

## 2013-11-11 LAB — BASIC METABOLIC PANEL
CO2: 30 mEq/L (ref 19–32)
Calcium: 8.7 mg/dL (ref 8.4–10.5)
Creatinine, Ser: 0.77 mg/dL (ref 0.50–1.10)
GFR calc Af Amer: >90 mL/min (ref >90)
GFR calc non Af Amer: 89 mL/min — ABNORMAL LOW (ref >90)
Potassium: 3.9 mEq/L (ref 3.5–5.1)

## 2013-11-11 NOTE — Progress Notes (Signed)
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

## 2013-11-11 NOTE — Progress Notes (Signed)
To come in for [id number] 

## 2013-11-14 ENCOUNTER — Ambulatory Visit (HOSPITAL_BASED_OUTPATIENT_CLINIC_OR_DEPARTMENT_OTHER): Payer: Federal, State, Local not specified - PPO | Admitting: Anesthesiology

## 2013-11-14 ENCOUNTER — Encounter (HOSPITAL_BASED_OUTPATIENT_CLINIC_OR_DEPARTMENT_OTHER): Payer: Self-pay | Admitting: *Deleted

## 2013-11-14 ENCOUNTER — Encounter (HOSPITAL_BASED_OUTPATIENT_CLINIC_OR_DEPARTMENT_OTHER)
Admission: RE | Disposition: A | Discharge status: Home / Self Care | Payer: Self-pay | Source: Ambulatory Visit | Attending: Specialist

## 2013-11-14 ENCOUNTER — Ambulatory Visit (HOSPITAL_BASED_OUTPATIENT_CLINIC_OR_DEPARTMENT_OTHER)
Admission: RE | Admit: 2013-11-14 | Discharge: 2013-11-14 | Disposition: A | Discharge status: Home / Self Care | Payer: Federal, State, Local not specified - PPO | Source: Ambulatory Visit | Attending: Specialist | Admitting: Specialist

## 2013-11-14 ENCOUNTER — Encounter (HOSPITAL_BASED_OUTPATIENT_CLINIC_OR_DEPARTMENT_OTHER): Payer: Federal, State, Local not specified - PPO | Admitting: Anesthesiology

## 2013-11-14 DIAGNOSIS — M6200 Separation of muscle (nontraumatic), unspecified site: Secondary | ICD-10-CM | POA: Insufficient documentation

## 2013-11-14 DIAGNOSIS — Z7982 Long term (current) use of aspirin: Secondary | ICD-10-CM | POA: Insufficient documentation

## 2013-11-14 DIAGNOSIS — Z86711 Personal history of pulmonary embolism: Secondary | ICD-10-CM | POA: Insufficient documentation

## 2013-11-14 DIAGNOSIS — Z01812 Encounter for preprocedural laboratory examination: Secondary | ICD-10-CM | POA: Insufficient documentation

## 2013-11-14 DIAGNOSIS — D1739 Benign lipomatous neoplasm of skin and subcutaneous tissue of other sites: Secondary | ICD-10-CM | POA: Insufficient documentation

## 2013-11-14 DIAGNOSIS — E785 Hyperlipidemia, unspecified: Secondary | ICD-10-CM | POA: Insufficient documentation

## 2013-11-14 DIAGNOSIS — E669 Obesity, unspecified: Secondary | ICD-10-CM | POA: Insufficient documentation

## 2013-11-14 DIAGNOSIS — G4733 Obstructive sleep apnea (adult) (pediatric): Secondary | ICD-10-CM | POA: Insufficient documentation

## 2013-11-14 DIAGNOSIS — Z01810 Encounter for preprocedural cardiovascular examination: Secondary | ICD-10-CM | POA: Insufficient documentation

## 2013-11-14 DIAGNOSIS — I10 Essential (primary) hypertension: Secondary | ICD-10-CM | POA: Insufficient documentation

## 2013-11-14 DIAGNOSIS — Z79899 Other long term (current) drug therapy: Secondary | ICD-10-CM | POA: Insufficient documentation

## 2013-11-14 DIAGNOSIS — K439 Ventral hernia without obstruction or gangrene: Secondary | ICD-10-CM | POA: Insufficient documentation

## 2013-11-14 DIAGNOSIS — IMO0002 Reserved for concepts with insufficient information to code with codable children: Secondary | ICD-10-CM | POA: Insufficient documentation

## 2013-11-14 DIAGNOSIS — K219 Gastro-esophageal reflux disease without esophagitis: Secondary | ICD-10-CM | POA: Insufficient documentation

## 2013-11-14 DIAGNOSIS — S98139A Complete traumatic amputation of one unspecified lesser toe, initial encounter: Secondary | ICD-10-CM | POA: Insufficient documentation

## 2013-11-14 HISTORY — DX: Sleep apnea, unspecified: G47.30

## 2013-11-14 HISTORY — DX: Reserved for concepts with insufficient information to code with codable children: IMO0002

## 2013-11-14 HISTORY — PX: MASS EXCISION: SHX2000

## 2013-11-14 HISTORY — DX: Dorsalgia, unspecified: M54.9

## 2013-11-14 HISTORY — DX: Shortness of breath: R06.02

## 2013-11-14 HISTORY — PX: LIPOSUCTION: SHX10

## 2013-11-14 HISTORY — PX: ABDOMINOPLASTY: SHX5355

## 2013-11-14 HISTORY — DX: Unspecified osteoarthritis, unspecified site: M19.90

## 2013-11-14 LAB — POCT HEMOGLOBIN-HEMACUE: Hemoglobin: 12 g/dL (ref 12.0–15.0)

## 2013-11-14 SURGERY — EXCISION MASS
Anesthesia: General

## 2013-11-14 MED ORDER — LIDOCAINE HCL (CARDIAC) 20 MG/ML IV SOLN
INTRAVENOUS | Status: DC | PRN
  Administered 2013-11-14 (×4): 50 mg via INTRAVENOUS

## 2013-11-14 MED ORDER — MIDAZOLAM HCL 5 MG/5ML IJ SOLN
INTRAMUSCULAR | Status: DC | PRN
  Administered 2013-11-14: 2 mg via INTRAVENOUS

## 2013-11-14 MED ORDER — FENTANYL CITRATE 0.05 MG/ML IJ SOLN
INTRAMUSCULAR | Status: AC
  Filled 2013-11-14: qty 2

## 2013-11-14 MED ORDER — HYDROMORPHONE HCL PF 1 MG/ML IJ SOLN
INTRAMUSCULAR | Status: AC
  Filled 2013-11-14: qty 1

## 2013-11-14 MED ORDER — GLYCOPYRROLATE 0.2 MG/ML IJ SOLN
INTRAMUSCULAR | Status: DC | PRN
  Administered 2013-11-14: 0.6 mg via INTRAVENOUS

## 2013-11-14 MED ORDER — HYDROMORPHONE HCL PF 1 MG/ML IJ SOLN
0.5000 mg | INTRAMUSCULAR | Status: DC | PRN
  Administered 2013-11-14: 0.5 mg via INTRAVENOUS

## 2013-11-14 MED ORDER — SODIUM CHLORIDE 0.9 % IJ SOLN
INTRAMUSCULAR | Status: DC | PRN
  Administered 2013-11-14: 08:00:00

## 2013-11-14 MED ORDER — FENTANYL CITRATE 0.05 MG/ML IJ SOLN
INTRAMUSCULAR | Status: DC | PRN
  Administered 2013-11-14: 100 ug via INTRAVENOUS

## 2013-11-14 MED ORDER — FENTANYL CITRATE 0.05 MG/ML IJ SOLN: 50.0000 ug | INTRAMUSCULAR | Status: DC | PRN

## 2013-11-14 MED ORDER — MIDAZOLAM HCL 2 MG/2ML IJ SOLN: 1.0000 mg | INTRAMUSCULAR | Status: DC | PRN

## 2013-11-14 MED ORDER — SODIUM CHLORIDE 0.9 % IV SOLN
INTRAVENOUS | Status: DC | PRN
  Administered 2013-11-14: 08:00:00 via INTRAMUSCULAR

## 2013-11-14 MED ORDER — CEFAZOLIN SODIUM 1-5 GM-% IV SOLN
INTRAVENOUS | Status: AC
  Filled 2013-11-14: qty 100

## 2013-11-14 MED ORDER — FENTANYL CITRATE 0.05 MG/ML IJ SOLN
25.0000 ug | INTRAMUSCULAR | Status: DC | PRN
  Administered 2013-11-14: 50 ug via INTRAVENOUS
  Administered 2013-11-14 (×2): 25 ug via INTRAVENOUS

## 2013-11-14 MED ORDER — ROCURONIUM BROMIDE 100 MG/10ML IV SOLN
INTRAVENOUS | Status: DC | PRN
  Administered 2013-11-14: 50 mg via INTRAVENOUS

## 2013-11-14 MED ORDER — ONDANSETRON HCL 4 MG/2ML IJ SOLN
INTRAMUSCULAR | Status: DC | PRN
  Administered 2013-11-14: 4 mg via INTRAVENOUS

## 2013-11-14 MED ORDER — LACTATED RINGERS IV SOLN
INTRAVENOUS | Status: DC
  Administered 2013-11-14: 07:00:00 via INTRAVENOUS
  Administered 2013-11-14: 20 mL/h via INTRAVENOUS

## 2013-11-14 MED ORDER — SCOPOLAMINE 1 MG/3DAYS TD PT72
MEDICATED_PATCH | TRANSDERMAL | Status: AC
  Filled 2013-11-14: qty 1

## 2013-11-14 MED ORDER — DEXAMETHASONE SODIUM PHOSPHATE 4 MG/ML IJ SOLN
INTRAMUSCULAR | Status: DC | PRN
  Administered 2013-11-14: 10 mg via INTRAVENOUS

## 2013-11-14 MED ORDER — OXYCODONE HCL 5 MG/5ML PO SOLN: 5.0000 mg | Freq: Once | ORAL | Status: DC | PRN

## 2013-11-14 MED ORDER — BUPIVACAINE LIPOSOME 1.3 % IJ SUSP
INTRAMUSCULAR | Status: AC
  Filled 2013-11-14: qty 20

## 2013-11-14 MED ORDER — HYDROMORPHONE HCL PF 1 MG/ML IJ SOLN
0.2500 mg | INTRAMUSCULAR | Status: DC | PRN
  Administered 2013-11-14 (×4): 0.5 mg via INTRAVENOUS

## 2013-11-14 MED ORDER — NEOSTIGMINE METHYLSULFATE 1 MG/ML IJ SOLN
INTRAMUSCULAR | Status: DC | PRN
  Administered 2013-11-14: 4 mg via INTRAVENOUS

## 2013-11-14 MED ORDER — SCOPOLAMINE 1 MG/3DAYS TD PT72
MEDICATED_PATCH | TRANSDERMAL | Status: DC | PRN
  Administered 2013-11-14: 1 via TRANSDERMAL

## 2013-11-14 MED ORDER — OXYCODONE HCL 5 MG PO TABS: 5.0000 mg | ORAL_TABLET | Freq: Once | ORAL | Status: DC | PRN

## 2013-11-14 MED ORDER — CEFAZOLIN SODIUM-DEXTROSE 2-3 GM-% IV SOLR
2.0000 g | INTRAVENOUS | Status: AC
  Administered 2013-11-14: 2 g via INTRAVENOUS

## 2013-11-14 MED ORDER — METOCLOPRAMIDE HCL 5 MG/ML IJ SOLN: 10.0000 mg | Freq: Once | INTRAMUSCULAR | Status: DC | PRN

## 2013-11-14 MED ORDER — PROPOFOL 10 MG/ML IV BOLUS
INTRAVENOUS | Status: DC | PRN
  Administered 2013-11-14: 50 mg via INTRAVENOUS
  Administered 2013-11-14: 200 mg via INTRAVENOUS

## 2013-11-14 SURGICAL SUPPLY — 101 items
APPLICATOR COTTON TIP 6IN STRL (MISCELLANEOUS) ×2 IMPLANT
BAG DECANTER FOR FLEXI CONT (MISCELLANEOUS) ×2 IMPLANT
BANDAGE ELASTIC 4 VELCRO ST LF (GAUZE/BANDAGES/DRESSINGS) IMPLANT
BENZOIN TINCTURE PRP APPL 2/3 (GAUZE/BANDAGES/DRESSINGS) ×4 IMPLANT
BINDER ABD UNIV 12 30-45 (MISCELLANEOUS) ×1 IMPLANT
BINDER ABDOMINAL 10 (MISCELLANEOUS) IMPLANT
BINDER ABDOMINAL 12 (MISCELLANEOUS) ×2
BLADE HEX COATED 2.75 (ELECTRODE) ×2 IMPLANT
BLADE KNIFE PERSONA 10 (BLADE) ×2 IMPLANT
BLADE KNIFE PERSONA 15 (BLADE) ×2 IMPLANT
BNDG COHESIVE 4X5 TAN STRL (GAUZE/BANDAGES/DRESSINGS) IMPLANT
BNDG GAUZE ELAST 4 BULKY (GAUZE/BANDAGES/DRESSINGS) IMPLANT
CANISTER LINER 1300 C W/ELBOW (MISCELLANEOUS) ×2 IMPLANT
CANISTER SUCT 1200ML W/VALVE (MISCELLANEOUS) ×2 IMPLANT
CLEANER CAUTERY TIP 5X5 PAD (MISCELLANEOUS) ×1 IMPLANT
COMPRESSION GARMENT LG MOREWEL (MISCELLANEOUS) IMPLANT
COTTONBALL LRG STERILE PKG (GAUZE/BANDAGES/DRESSINGS) ×2 IMPLANT
COVER MAYO STAND STRL (DRAPES) ×2 IMPLANT
COVER TABLE BACK 60X90 (DRAPES) ×2 IMPLANT
DECANTER SPIKE VIAL GLASS SM (MISCELLANEOUS) IMPLANT
DRAIN CHANNEL 10F 3/8 F FF (DRAIN) IMPLANT
DRAPE LAPAROSCOPIC ABDOMINAL (DRAPES) ×2 IMPLANT
DRAPE PED LAPAROTOMY (DRAPES) IMPLANT
DRAPE U-SHAPE 76X120 STRL (DRAPES) IMPLANT
DRAPE UTILITY XL STRL (DRAPES) ×2 IMPLANT
ELECT BLADE 6.5 .24CM SHAFT (ELECTRODE) ×2 IMPLANT
ELECT NEEDLE TIP 2.8 STRL (NEEDLE) ×2 IMPLANT
ELECT REM PT RETURN 9FT ADLT (ELECTROSURGICAL) ×2
ELECTRODE REM PT RTRN 9FT ADLT (ELECTROSURGICAL) ×1 IMPLANT
EVACUATOR 3/16  PVC DRAIN (DRAIN)
EVACUATOR 3/16 PVC DRAIN (DRAIN) IMPLANT
EVACUATOR SILICONE 100CC (DRAIN) ×2 IMPLANT
FILTER 7/8 IN (FILTER) ×2 IMPLANT
FILTER LIPOSUCTION (MISCELLANEOUS) ×2 IMPLANT
GAUZE SPONGE 4X4 12PLY STRL LF (GAUZE/BANDAGES/DRESSINGS) IMPLANT
GAUZE SPONGE 4X4 16PLY XRAY LF (GAUZE/BANDAGES/DRESSINGS) IMPLANT
GAUZE XEROFORM 1X8 LF (GAUZE/BANDAGES/DRESSINGS) IMPLANT
GAUZE XEROFORM 5X9 LF (GAUZE/BANDAGES/DRESSINGS) ×2 IMPLANT
GLOVE BIO SURGEON STRL SZ 6.5 (GLOVE) ×2 IMPLANT
GLOVE BIOGEL M STRL SZ7.5 (GLOVE) ×2 IMPLANT
GLOVE BIOGEL PI IND STRL 8 (GLOVE) ×1 IMPLANT
GLOVE BIOGEL PI INDICATOR 8 (GLOVE) ×1
GLOVE ECLIPSE 7.0 STRL STRAW (GLOVE) ×2 IMPLANT
GOWN PREVENTION PLUS XLARGE (GOWN DISPOSABLE) IMPLANT
GOWN PREVENTION PLUS XXLARGE (GOWN DISPOSABLE) ×4 IMPLANT
HOLDER FOLEY CATH W/STRAP (MISCELLANEOUS) IMPLANT
IV LACTATED RINGERS 1000ML (IV SOLUTION) IMPLANT
IV NS 500ML (IV SOLUTION) ×1
IV NS 500ML BAXH (IV SOLUTION) ×1 IMPLANT
NDL SAFETY ECLIPSE 18X1.5 (NEEDLE) ×1 IMPLANT
NEEDLE HYPO 18GX1.5 SHARP (NEEDLE) ×1
NEEDLE HYPO 25X1 1.5 SAFETY (NEEDLE) ×2 IMPLANT
NEEDLE SPNL 18GX3.5 QUINCKE PK (NEEDLE) ×2 IMPLANT
NS IRRIG 1000ML POUR BTL (IV SOLUTION) IMPLANT
PACK BASIN DAY SURGERY FS (CUSTOM PROCEDURE TRAY) ×2 IMPLANT
PAD ABD 8X10 STRL (GAUZE/BANDAGES/DRESSINGS) ×8 IMPLANT
PAD CLEANER CAUTERY TIP 5X5 (MISCELLANEOUS) ×1
PEN SKIN MARKING BROAD TIP (MISCELLANEOUS) ×2 IMPLANT
PENCIL BUTTON HOLSTER BLD 10FT (ELECTRODE) ×2 IMPLANT
PIN SAFETY STERILE (MISCELLANEOUS) ×2 IMPLANT
SET SECONDARY (IV SETS) ×2 IMPLANT
SHEET MEDIUM DRAPE 40X70 STRL (DRAPES) ×8 IMPLANT
SHEETING SILICONE GEL EPI DERM (MISCELLANEOUS) ×2 IMPLANT
SLEEVE SCD COMPRESS KNEE MED (MISCELLANEOUS) ×2 IMPLANT
SPONGE GAUZE 4X4 12PLY (GAUZE/BANDAGES/DRESSINGS) ×4 IMPLANT
SPONGE LAP 18X18 X RAY DECT (DISPOSABLE) ×8 IMPLANT
STAPLER VISISTAT 35W (STAPLE) ×2 IMPLANT
STOCKINETTE 4X48 STRL (DRAPES) IMPLANT
STOCKINETTE IMPERVIOUS LG (DRAPES) IMPLANT
STRIP CLOSURE SKIN 1/2X4 (GAUZE/BANDAGES/DRESSINGS) ×2 IMPLANT
STRIP CLOSURE SKIN 1/8X3 (GAUZE/BANDAGES/DRESSINGS) IMPLANT
STRIP SUTURE WOUND CLOSURE 1/2 (SUTURE) ×6 IMPLANT
SUCTION FRAZIER TIP 10 FR DISP (SUCTIONS) IMPLANT
SUT ETHILON 3 0 PS 1 (SUTURE) IMPLANT
SUT MNCRL AB 3-0 PS2 18 (SUTURE) ×4 IMPLANT
SUT MON AB 2-0 CT1 36 (SUTURE) IMPLANT
SUT MON AB 5-0 PS2 18 (SUTURE) IMPLANT
SUT NOVA NAB GS-22 2 0 T19 (SUTURE) IMPLANT
SUT PROLENE 1 CT (SUTURE) ×4 IMPLANT
SUT PROLENE 4 0 P 3 18 (SUTURE) IMPLANT
SUT PROLENE 4 0 PS 2 18 (SUTURE) ×2 IMPLANT
SUT SILK 3 0 PS 1 (SUTURE) IMPLANT
SUT SILK 3 0 TIES 17X18 (SUTURE)
SUT SILK 3-0 18XBRD TIE BLK (SUTURE) IMPLANT
SUT VIC AB 0 CT1 27 (SUTURE)
SUT VIC AB 0 CT1 27XBRD ANBCTR (SUTURE) IMPLANT
SYR 20CC LL (SYRINGE) IMPLANT
SYR 50ML LL SCALE MARK (SYRINGE) ×4 IMPLANT
SYR CONTROL 10ML LL (SYRINGE) ×2 IMPLANT
TAPE HYPAFIX 6X30 (GAUZE/BANDAGES/DRESSINGS) ×2 IMPLANT
TAPE MEASURE 72IN RETRACT (INSTRUMENTS)
TAPE MEASURE LINEN 72IN RETRCT (INSTRUMENTS) IMPLANT
TOWEL OR 17X24 6PK STRL BLUE (TOWEL DISPOSABLE) ×8 IMPLANT
TOWEL OR NON WOVEN STRL DISP B (DISPOSABLE) ×2 IMPLANT
TRAY DSU PREP LF (CUSTOM PROCEDURE TRAY) ×2 IMPLANT
TRAY FOLEY CATH 14FR (SET/KITS/TRAYS/PACK) ×2 IMPLANT
TUBE CONNECTING 20X1/4 (TUBING) ×2 IMPLANT
TUBING SET GRADUATE ASPIR 12FT (MISCELLANEOUS) ×2 IMPLANT
UNDERPAD 30X30 INCONTINENT (UNDERPADS AND DIAPERS) ×4 IMPLANT
VAC PENCILS W/TUBING CLEAR (MISCELLANEOUS) ×2 IMPLANT
YANKAUER SUCT BULB TIP NO VENT (SUCTIONS) ×2 IMPLANT

## 2013-11-14 NOTE — Anesthesia Procedure Notes (Signed)
Procedure Name: Intubation Performed by: York Grice Pre-anesthesia Checklist: Patient identified, Timeout performed, Emergency Drugs available, Suction available and Patient being monitored Patient Re-evaluated:Patient Re-evaluated prior to inductionOxygen Delivery Method: Circle system utilized Preoxygenation: Pre-oxygenation with 100% oxygen Intubation Type: IV induction Ventilation: Mask ventilation without difficulty Laryngoscope Size: Miller and 2 Grade View: Grade II Tube type: Oral Tube size: 7.0 mm Number of attempts: 1 Placement Confirmation: ETT inserted through vocal cords under direct vision,  breath sounds checked- equal and bilateral and positive ETCO2 Secured at: 22 cm Tube secured with: Tape Dental Injury: Teeth and Oropharynx as per pre-operative assessment

## 2013-11-14 NOTE — Brief Op Note (Signed)
11/14/2013  8:44 AM  PATIENT:  Judy [name]  62 y.o. female  PRE-OPERATIVE DIAGNOSIS:  ventral hernia/abdominal mass  POST-OPERATIVE DIAGNOSIS:  ventral hernia/abdominal mass  PROCEDURE:  Procedure(s): EXCISION MASS WITH LIPO POSSIBLE MESH (N/A) LIPOSUCTION (N/A) REPAIR OF DIASTASIS RECTI/POSSIBLE VENTRAL HERNIA OF ABDOMEN (N/A)  SURGEON:  Surgeon(s) and Role:    * Louisa Second, MD - Primary  PHYSICIAN ASSISTANT:   ASSISTANTS: none   ANESTHESIA:   general  EBL:  Total I/O In: 1700 [I.V.:1700] Out: -   BLOOD ADMINISTERED:none  DRAINS: (right lateral abdomen) Jackson-Pratt drain(s) with closed bulb suction in the right abdomen   LOCAL MEDICATIONS USED:  XYLOCAINE   SPECIMEN:  Excision  DISPOSITION OF SPECIMEN:  PATHOLOGY  COUNTS:  YES  TOURNIQUET:  * No tourniquets in log *  DICTATION: .Other Dictation: Dictation Number [id number]  PLAN OF CARE: Discharge to home after PACU  PATIENT DISPOSITION:  PACU - hemodynamically stable.   Delay start of Pharmacological VTE agent (>24hrs) due to surgical blood loss or risk of bleeding: yes

## 2013-11-14 NOTE — Anesthesia Preprocedure Evaluation (Signed)
Anesthesia Evaluation  Patient identified by MRN, date of birth, ID band Patient awake    Reviewed: Allergy & Precautions, H&P , NPO status , Patient's Chart, lab work & pertinent test results, reviewed documented beta blocker date and time   Airway Mallampati: II TM Distance: >3 FB Neck ROM: full    Dental   Pulmonary shortness of breath and with exertion, sleep apnea ,  breath sounds clear to auscultation        Cardiovascular hypertension, On Medications Rhythm:regular     Neuro/Psych PSYCHIATRIC DISORDERS negative neurological ROS     GI/Hepatic Neg liver ROS, GERD-  Medicated and Controlled,  Endo/Other  Morbid obesity  Renal/GU negative Renal ROS  negative genitourinary   Musculoskeletal   Abdominal   Peds  Hematology negative hematology ROS (+)   Anesthesia Other Findings See surgeon's H&P   Reproductive/Obstetrics negative OB ROS                           Anesthesia Physical Anesthesia Plan  ASA: III  Anesthesia Plan: General   Post-op Pain Management:    Induction: Intravenous  Airway Management Planned: Oral ETT  Additional Equipment:   Intra-op Plan:   Post-operative Plan: Extubation in OR  Informed Consent: I have reviewed the patients History and Physical, chart, labs and discussed the procedure including the risks, benefits and alternatives for the proposed anesthesia with the patient or authorized representative who has indicated his/her understanding and acceptance.   Dental Advisory Given  Plan Discussed with: CRNA and Surgeon  Anesthesia Plan Comments:         Anesthesia Quick Evaluation

## 2013-11-14 NOTE — Anesthesia Postprocedure Evaluation (Signed)
Anesthesia Post Note  Patient: Judy [name]  Procedure(s) Performed: Procedure(s) (LRB): EXCISION MASS WITH LIPO POSSIBLE MESH (N/A) LIPOSUCTION (N/A) REPAIR OF DIASTASIS RECTI/POSSIBLE VENTRAL HERNIA OF ABDOMEN (N/A)  Anesthesia type: General  Patient location: PACU  Post pain: Pain level controlled  Post assessment: Patient's Cardiovascular Status Stable  Last Vitals:  Filed Vitals:   11/14/13 1215  BP: 142/59  Pulse: 77  Temp:   Resp:     Post vital signs: Reviewed and stable  Level of consciousness: alert  Complications: No apparent anesthesia complications

## 2013-11-14 NOTE — Transfer of Care (Signed)
Immediate Anesthesia Transfer of Care Note  Patient: SELIA WAREING  Procedure(s) Performed: Procedure(s): EXCISION MASS WITH LIPO POSSIBLE MESH (N/A) LIPOSUCTION (N/A) REPAIR OF DIASTASIS RECTI/POSSIBLE VENTRAL HERNIA OF ABDOMEN (N/A)  Patient Location: PACU  Anesthesia Type:General  Level of Consciousness: awake, alert  and oriented  Airway & Oxygen Therapy: Patient Spontanous Breathing and Patient connected to face mask oxygen  Post-op Assessment: Report given to PACU RN and Post -op Vital signs reviewed and stable  Post vital signs: Reviewed and stable  Complications: No apparent anesthesia complications
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
## 2013-11-15 ENCOUNTER — Ambulatory Visit (INDEPENDENT_AMBULATORY_CARE_PROVIDER_SITE_OTHER): Payer: Self-pay | Admitting: General Surgery

## 2013-11-16 ENCOUNTER — Encounter (HOSPITAL_BASED_OUTPATIENT_CLINIC_OR_DEPARTMENT_OTHER): Payer: Self-pay | Admitting: Specialist

## 2013-11-17 NOTE — Op Note (Signed)
Judy [name], Judy [name] NO.:  [id number]  MEDICAL RECORD NO.:  [id number]  LOCATION:                               FACILITY:  [facility]  PHYSICIAN:  Earvin Hansen L. Jannel Lynne, M.D.DATE OF BIRTH:  02/26/1951  DATE OF PROCEDURE:  11/14/2013 DATE OF DISCHARGE:  11/14/2013                              OPERATIVE REPORT   HISTORY:  This 62 year old lady has increasing mass involved in her upper abdomen between the umbilicus and the xiphoid process, increased pain and discomfort.  She feels the area gets enlarged.  On examination, the patient is flat.  She has 2 problems.  She has a mass involving the upper belly consistent with a large lipoma measuring almost 8 x 12 inches and then she also has a defect involving the midline in the right paramedian area above the umbilicus measuring approximately 8 x 10 cm.  PROCEDURE DONE:  Exploration, excision of mass using liposuction assistance, repair of large ventral defect and diastasis recti.  ANESTHESIA:  General.  DESCRIPTION OF PROCEDURE:  The patient underwent general anesthesia, intubated orally, preoperatively the area was drawn.  She was intubated orally without any difficulty.  Prep was done to the chest, breast, abdominal areas using Hibiclens soap and solution and walled off with sterile towels and drapes so as to make a sterile field.  I used a periumbilical upside-down horseshoe type of incision with #15 blade down to the skin and underlying subcutaneous closed tissue.  I dissected up to the mass.  We had a consistently large lipomatous mass.  I used liposuction assistance to remove this mass in total, used a [place] catheter 20-3/4 removing over 300 mL.  Next, dissection was carried up over the fascia to the xiphoid process area.  Using lighter retraction, I was able to identify a large defect 4 x 8 cm with herniated intraabdominal contents consistent with omentum and possibly a small part of small bowel.  This upper part  was adhesed to the fascia and was taken down using Metzenbaum scissors and then the liver back into the intra-abdominal space.  Next, the defect was closed, advanced until pannus using 0 Prolene and then diastasis above it using a running #1 Prolene as well as 2-0 Novafil sutures.  The wound was irrigated after proper hemostasis using the Bovie anticoagulation.  Flaps were placed back into the location and closed deep with 2-0 Monocryl x2 layers, then a running subcuticular stitch of 3-0 Monocryl.  Steri-Strips and soft dressings were included including a silicone gel patch to hopefully prevent hypertrophic scarring.  A 4x4s, ABDs, Hypafix tape and abdominal support was placed.  The intra-abdominal wounds were also drained with #7 fully fluted Blake drain which was placed in the depths of wound and brought out through the lateral-most portion and incision secured with 3-0 Prolene.  The patient was taken to recovery in excellent condition.     Yaakov Guthrie. Shon Hough, M.D.   ______________________________ Yaakov Guthrie. Shon Hough, M.D.    Cathie Hoops  D:  11/14/2013  T:  11/15/2013  Job:  [id number]

## 2013-12-21 ENCOUNTER — Other Ambulatory Visit: Payer: Self-pay | Admitting: Internal Medicine

## 2013-12-21 ENCOUNTER — Telehealth: Payer: Self-pay | Admitting: *Deleted

## 2013-12-21 MED ORDER — CANDESARTAN CILEXETIL 16 MG PO TABS: 16.0000 mg | ORAL_TABLET | Freq: Every day | ORAL | Status: DC

## 2013-12-21 MED ORDER — HYDROCHLOROTHIAZIDE 12.5 MG PO TABS: ORAL_TABLET | ORAL | Status: DC

## 2013-12-21 NOTE — Telephone Encounter (Signed)
Judy [name] called this medicine is no longer available in combined form.  She is ok with taking it seperately in 2 separate pills. She will call pharmacy and have them fax over a copy of old Rx. candesartan-hydrochlorothiazide (ATACAND HCT) 16-12.5 MG per tablet

## 2014-01-22 ENCOUNTER — Other Ambulatory Visit: Payer: Self-pay | Admitting: Internal Medicine

## 2014-01-23 NOTE — Telephone Encounter (Signed)
Refill req for Celebrex

## 2014-02-20 ENCOUNTER — Telehealth: Payer: Self-pay | Admitting: *Deleted

## 2014-02-20 NOTE — Telephone Encounter (Signed)
Judy [name] would like you to call her.  She had food poisoning last week and now her voice is very hoarse due to all the vomiting.  She is concerned about getting her voice back.

## 2014-02-21 NOTE — Telephone Encounter (Signed)
Pt states that she had food poisoning last week with a lot of forceful vomiting, and since that time her voice has been hoarse and throat sore. Advised pt that this is common with vomiting and mostly like due to stomach acid, advised pt to try warm salt gargles and OTC sore throat remedies, If she continues to have problems, develops difficulty swallowing or other concerning sx to call the office for an appt.

## 2014-02-27 ENCOUNTER — Other Ambulatory Visit: Payer: Self-pay | Admitting: Internal Medicine

## 2014-02-27 NOTE — Telephone Encounter (Signed)
Refill request  Pt requesting 90 day supply

## 2014-03-28 ENCOUNTER — Encounter: Payer: Self-pay | Admitting: Internal Medicine

## 2014-03-28 ENCOUNTER — Ambulatory Visit (INDEPENDENT_AMBULATORY_CARE_PROVIDER_SITE_OTHER): Payer: Federal, State, Local not specified - PPO | Admitting: Internal Medicine

## 2014-03-28 VITALS — BP 140/78 | HR 87 | Temp 98.2°F | Resp 16 | Wt 248.0 lb

## 2014-03-28 DIAGNOSIS — M47817 Spondylosis without myelopathy or radiculopathy, lumbosacral region: Secondary | ICD-10-CM

## 2014-03-28 DIAGNOSIS — M47816 Spondylosis without myelopathy or radiculopathy, lumbar region: Secondary | ICD-10-CM

## 2014-03-28 DIAGNOSIS — K439 Ventral hernia without obstruction or gangrene: Secondary | ICD-10-CM

## 2014-03-28 DIAGNOSIS — M549 Dorsalgia, unspecified: Secondary | ICD-10-CM

## 2014-03-28 MED ORDER — OXYCODONE-ACETAMINOPHEN 7.5-325 MG PO TABS: 1.0000 | ORAL_TABLET | ORAL | Status: DC | PRN

## 2014-03-28 MED ORDER — CYCLOBENZAPRINE HCL 10 MG PO TABS: 10.0000 mg | ORAL_TABLET | Freq: Three times a day (TID) | ORAL | Status: DC | PRN

## 2014-03-28 MED ORDER — PREDNISONE 20 MG PO TABS: ORAL_TABLET | ORAL | Status: DC

## 2014-03-28 MED ORDER — METHYLPREDNISOLONE ACETATE 80 MG/ML IJ SUSP
160.0000 mg | Freq: Once | INTRAMUSCULAR | Status: AC
  Administered 2014-03-28: 160 mg via INTRAMUSCULAR
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
## 2014-03-28 NOTE — Patient Instructions (Addendum)
Give pt number to Dr. Raynelle Chary chiropracter on [address].    [phone]  See me in 2 weeks or sooner if not better

## 2014-03-30 ENCOUNTER — Other Ambulatory Visit: Payer: Self-pay | Admitting: *Deleted

## 2014-03-30 NOTE — Telephone Encounter (Signed)
Refill request

## 2014-03-31 ENCOUNTER — Other Ambulatory Visit: Payer: Self-pay | Admitting: Internal Medicine

## 2014-04-03 MED ORDER — CELECOXIB 200 MG PO CAPS: ORAL_CAPSULE | ORAL | Status: DC

## 2014-04-13 ENCOUNTER — Telehealth: Payer: Self-pay | Admitting: *Deleted
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
## 2014-04-14 ENCOUNTER — Emergency Department (INDEPENDENT_AMBULATORY_CARE_PROVIDER_SITE_OTHER): Payer: Federal, State, Local not specified - PPO

## 2014-04-14 ENCOUNTER — Encounter: Payer: Self-pay | Admitting: Emergency Medicine

## 2014-04-14 ENCOUNTER — Emergency Department
Admission: EM | Admit: 2014-04-14 | Discharge: 2014-04-14 | Disposition: A | Discharge status: Home / Self Care | Payer: Federal, State, Local not specified - PPO | Source: Home / Self Care | Attending: Emergency Medicine | Admitting: Emergency Medicine

## 2014-04-14 DIAGNOSIS — S2239XA Fracture of one rib, unspecified side, initial encounter for closed fracture: Secondary | ICD-10-CM

## 2014-04-14 DIAGNOSIS — M5380 Other specified dorsopathies, site unspecified: Secondary | ICD-10-CM

## 2014-04-14 DIAGNOSIS — IMO0002 Reserved for concepts with insufficient information to code with codable children: Secondary | ICD-10-CM

## 2014-04-14 DIAGNOSIS — S139XXA Sprain of joints and ligaments of unspecified parts of neck, initial encounter: Secondary | ICD-10-CM

## 2014-04-14 DIAGNOSIS — S2232XA Fracture of one rib, left side, initial encounter for closed fracture: Secondary | ICD-10-CM

## 2014-04-14 DIAGNOSIS — M25519 Pain in unspecified shoulder: Secondary | ICD-10-CM

## 2014-04-14 DIAGNOSIS — S40012A Contusion of left shoulder, initial encounter: Secondary | ICD-10-CM

## 2014-04-14 DIAGNOSIS — S239XXA Sprain of unspecified parts of thorax, initial encounter: Secondary | ICD-10-CM

## 2014-04-14 MED ORDER — HYDROCODONE-ACETAMINOPHEN 5-325 MG PO TABS: 1.0000 | ORAL_TABLET | ORAL | Status: DC | PRN

## 2014-04-14 MED ORDER — CARISOPRODOL 350 MG PO TABS: ORAL_TABLET | ORAL | Status: DC

## 2014-04-14 MED ORDER — KETOROLAC TROMETHAMINE 60 MG/2ML IM SOLN
60.0000 mg | Freq: Once | INTRAMUSCULAR | Status: AC
Start: 2014-04-14 — End: 2014-04-14
  Administered 2014-04-14: 60 mg via INTRAMUSCULAR
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
## 2014-04-18 ENCOUNTER — Other Ambulatory Visit: Payer: Self-pay | Admitting: *Deleted

## 2014-04-19 ENCOUNTER — Encounter: Payer: Self-pay | Admitting: Internal Medicine

## 2014-04-19 ENCOUNTER — Ambulatory Visit (INDEPENDENT_AMBULATORY_CARE_PROVIDER_SITE_OTHER): Payer: Federal, State, Local not specified - PPO | Admitting: Internal Medicine

## 2014-04-19 ENCOUNTER — Telehealth: Payer: Self-pay | Admitting: Internal Medicine

## 2014-04-19 ENCOUNTER — Ambulatory Visit (HOSPITAL_BASED_OUTPATIENT_CLINIC_OR_DEPARTMENT_OTHER)
Admission: RE | Admit: 2014-04-19 | Discharge: 2014-04-19 | Disposition: A | Discharge status: Home / Self Care | Payer: No Typology Code available for payment source | Source: Ambulatory Visit | Attending: Internal Medicine | Admitting: Internal Medicine

## 2014-04-19 VITALS — BP 117/71 | HR 90 | Temp 98.3°F | Resp 18 | Ht 68.0 in | Wt 246.0 lb

## 2014-04-19 DIAGNOSIS — G319 Degenerative disease of nervous system, unspecified: Secondary | ICD-10-CM | POA: Diagnosis not present

## 2014-04-19 DIAGNOSIS — R42 Dizziness and giddiness: Secondary | ICD-10-CM | POA: Diagnosis not present

## 2014-04-19 DIAGNOSIS — R51 Headache: Secondary | ICD-10-CM | POA: Insufficient documentation

## 2014-04-19 DIAGNOSIS — S2249XA Multiple fractures of ribs, unspecified side, initial encounter for closed fracture: Secondary | ICD-10-CM

## 2014-04-19 LAB — COMPREHENSIVE METABOLIC PANEL
ALBUMIN: 4.1 g/dL (ref 3.5–5.2)
ALT: 12 U/L (ref 0–35)
AST: 15 U/L (ref 0–37)
Alkaline Phosphatase: 87 U/L (ref 39–117)
BUN: 17 mg/dL (ref 6–23)
CHLORIDE: 99 meq/L (ref 96–112)
CO2: 26 mEq/L (ref 19–32)
CREATININE: 1 mg/dL (ref 0.50–1.10)
Calcium: 9.3 mg/dL (ref 8.4–10.5)
Glucose, Bld: 108 mg/dL — ABNORMAL HIGH (ref 70–99)
POTASSIUM: 3.9 meq/L (ref 3.5–5.3)
Sodium: 137 mEq/L (ref 135–145)
Total Bilirubin: 1.1 mg/dL (ref 0.2–1.2)
Total Protein: 7.1 g/dL (ref 6.0–8.3)

## 2014-04-19 LAB — CBC WITH DIFFERENTIAL/PLATELET
BASOS ABS: 0 10*3/uL (ref 0.0–0.1)
Basophils Relative: 0 % (ref 0–1)
Eosinophils Absolute: 0.1 10*3/uL (ref 0.0–0.7)
Eosinophils Relative: 1 % (ref 0–5)
HCT: 36.3 % (ref 36.0–46.0)
Hemoglobin: 12.5 g/dL (ref 12.0–15.0)
Lymphocytes Relative: 19 % (ref 12–46)
Lymphs Abs: 2.1 10*3/uL (ref 0.7–4.0)
MCH: 27.2 pg (ref 26.0–34.0)
MCHC: 34.4 g/dL (ref 30.0–36.0)
MCV: 78.9 fL (ref 78.0–100.0)
Monocytes Absolute: 0.7 10*3/uL (ref 0.1–1.0)
Monocytes Relative: 6 % (ref 3–12)
NEUTROS ABS: 8.1 10*3/uL — AB (ref 1.7–7.7)
Neutrophils Relative %: 74 % (ref 43–77)
Platelets: 334 10*3/uL (ref 150–400)
RBC: 4.6 MIL/uL (ref 3.87–5.11)
RDW: 14.7 % (ref 11.5–15.5)
WBC: 11 10*3/uL — AB (ref 4.0–10.5)

## 2014-04-19 MED ORDER — HYDROCODONE-ACETAMINOPHEN 5-325 MG PO TABS: 1.0000 | ORAL_TABLET | ORAL | Status: DC | PRN
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
## 2014-04-19 NOTE — Patient Instructions (Signed)
Call office if any worsening
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
## 2014-04-21 ENCOUNTER — Encounter: Payer: Self-pay | Admitting: *Deleted
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
## 2014-04-21 NOTE — Telephone Encounter (Signed)
LVM message.

## 2014-04-27 ENCOUNTER — Ambulatory Visit (HOSPITAL_BASED_OUTPATIENT_CLINIC_OR_DEPARTMENT_OTHER)
Admission: RE | Admit: 2014-04-27 | Discharge: 2014-04-27 | Disposition: A | Discharge status: Home / Self Care | Payer: Federal, State, Local not specified - PPO | Source: Ambulatory Visit | Attending: Internal Medicine | Admitting: Internal Medicine

## 2014-04-27 ENCOUNTER — Ambulatory Visit (INDEPENDENT_AMBULATORY_CARE_PROVIDER_SITE_OTHER): Payer: Federal, State, Local not specified - PPO | Admitting: Internal Medicine

## 2014-04-27 ENCOUNTER — Encounter: Payer: Self-pay | Admitting: Internal Medicine

## 2014-04-27 VITALS — BP 129/71 | HR 67 | Temp 98.0°F | Resp 16 | Ht 68.0 in | Wt 243.0 lb

## 2014-04-27 DIAGNOSIS — S29011A Strain of muscle and tendon of front wall of thorax, initial encounter: Secondary | ICD-10-CM

## 2014-04-27 DIAGNOSIS — IMO0002 Reserved for concepts with insufficient information to code with codable children: Secondary | ICD-10-CM

## 2014-04-27 DIAGNOSIS — G479 Sleep disorder, unspecified: Secondary | ICD-10-CM

## 2014-04-27 DIAGNOSIS — R52 Pain, unspecified: Secondary | ICD-10-CM

## 2014-04-27 DIAGNOSIS — R079 Chest pain, unspecified: Secondary | ICD-10-CM | POA: Insufficient documentation

## 2014-04-27 MED ORDER — HYDROCODONE-ACETAMINOPHEN 5-325 MG PO TABS: 1.0000 | ORAL_TABLET | ORAL | Status: DC | PRN

## 2014-04-27 MED ORDER — ESZOPICLONE 3 MG PO TABS: 3.0000 mg | ORAL_TABLET | Freq: Every day | ORAL | Status: DC
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
## 2014-04-27 NOTE — Patient Instructions (Signed)
See me if any problems

## 2014-05-15 ENCOUNTER — Telehealth: Payer: Self-pay | Admitting: *Deleted

## 2014-05-15 NOTE — Telephone Encounter (Signed)
Gave pt number to  Fred May therapist

## 2014-05-30 ENCOUNTER — Encounter: Payer: Self-pay | Admitting: Internal Medicine

## 2014-05-30 ENCOUNTER — Ambulatory Visit (INDEPENDENT_AMBULATORY_CARE_PROVIDER_SITE_OTHER): Payer: Federal, State, Local not specified - PPO | Admitting: Internal Medicine

## 2014-05-30 VITALS — BP 133/72 | HR 87 | Resp 16 | Ht 68.0 in

## 2014-05-30 DIAGNOSIS — R0789 Other chest pain: Secondary | ICD-10-CM

## 2014-05-30 DIAGNOSIS — R071 Chest pain on breathing: Secondary | ICD-10-CM

## 2014-05-30 DIAGNOSIS — M25519 Pain in unspecified shoulder: Secondary | ICD-10-CM

## 2014-05-30 DIAGNOSIS — F4310 Post-traumatic stress disorder, unspecified: Secondary | ICD-10-CM

## 2014-05-30 DIAGNOSIS — M25512 Pain in left shoulder: Secondary | ICD-10-CM

## 2014-05-30 MED ORDER — HYDROCODONE-ACETAMINOPHEN 5-325 MG PO TABS: 1.0000 | ORAL_TABLET | ORAL | Status: DC | PRN

## 2014-05-30 MED ORDER — ESZOPICLONE 2 MG PO TABS: 2.0000 mg | ORAL_TABLET | Freq: Every evening | ORAL | Status: DC | PRN

## 2014-05-30 NOTE — Patient Instructions (Signed)
Set up appt with Dr Lynann Bologna   Patient to call for appointment with Dr. Lovenia Shuck
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
## 2014-05-31 NOTE — Addendum Note (Signed)
Addended by: Emi Belfast D on: 05/31/2014 08:46 AM   Modules accepted: Orders

## 2014-06-01 ENCOUNTER — Encounter: Payer: Self-pay | Admitting: Internal Medicine

## 2014-06-01 ENCOUNTER — Ambulatory Visit (INDEPENDENT_AMBULATORY_CARE_PROVIDER_SITE_OTHER): Payer: Federal, State, Local not specified - PPO | Admitting: Internal Medicine

## 2014-06-01 VITALS — BP 130/76 | HR 73 | Temp 98.4°F | Resp 16 | Wt 251.0 lb

## 2014-06-01 DIAGNOSIS — R350 Frequency of micturition: Secondary | ICD-10-CM

## 2014-06-01 LAB — POCT URINALYSIS DIPSTICK
Bilirubin, UA: NEGATIVE
Glucose, UA: NEGATIVE
Ketones, UA: NEGATIVE
Leukocytes, UA: NEGATIVE
Nitrite, UA: NEGATIVE
PH UA: 7.5
Protein, UA: NEGATIVE
UROBILINOGEN UA: NEGATIVE

## 2014-06-01 MED ORDER — CIPROFLOXACIN HCL 500 MG PO TABS: 500.0000 mg | ORAL_TABLET | Freq: Two times a day (BID) | ORAL | Status: DC
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
## 2014-06-01 NOTE — Patient Instructions (Signed)
Stop by xray to schedule MRI

## 2014-06-02 LAB — CULTURE, URINE COMPREHENSIVE
COLONY COUNT: NO GROWTH
Organism ID, Bacteria: NO GROWTH

## 2014-06-03 ENCOUNTER — Ambulatory Visit (HOSPITAL_BASED_OUTPATIENT_CLINIC_OR_DEPARTMENT_OTHER)
Admission: RE | Admit: 2014-06-03 | Discharge: 2014-06-03 | Disposition: A | Discharge status: Home / Self Care | Payer: Federal, State, Local not specified - PPO | Source: Ambulatory Visit | Attending: Internal Medicine | Admitting: Internal Medicine

## 2014-06-03 DIAGNOSIS — IMO0002 Reserved for concepts with insufficient information to code with codable children: Secondary | ICD-10-CM | POA: Diagnosis not present

## 2014-06-03 DIAGNOSIS — M67919 Unspecified disorder of synovium and tendon, unspecified shoulder: Secondary | ICD-10-CM | POA: Insufficient documentation

## 2014-06-03 DIAGNOSIS — M19019 Primary osteoarthritis, unspecified shoulder: Secondary | ICD-10-CM | POA: Insufficient documentation

## 2014-06-03 DIAGNOSIS — X58XXXA Exposure to other specified factors, initial encounter: Secondary | ICD-10-CM | POA: Diagnosis not present

## 2014-06-03 DIAGNOSIS — M25519 Pain in unspecified shoulder: Secondary | ICD-10-CM | POA: Insufficient documentation

## 2014-06-03 DIAGNOSIS — M719 Bursopathy, unspecified: Secondary | ICD-10-CM | POA: Insufficient documentation

## 2014-06-03 DIAGNOSIS — M25512 Pain in left shoulder: Secondary | ICD-10-CM

## 2014-06-05 ENCOUNTER — Telehealth: Payer: Self-pay | Admitting: Internal Medicine

## 2014-06-05 ENCOUNTER — Telehealth: Payer: Self-pay | Admitting: *Deleted

## 2014-06-05 NOTE — Telephone Encounter (Signed)
Left message on both mobile numbers for pt to call to go over MRI shoulder results

## 2014-06-05 NOTE — Telephone Encounter (Signed)
Called pt to set appt for repeat u/a and follow up - [facility]

## 2014-06-05 NOTE — Telephone Encounter (Signed)
Message copied by Gretchen Short on Mon Jun 05, 2014 10:29 AM ------      Message from: Emi Belfast D      Created: Sun Jun 04, 2014  6:35 PM       [facility]            Call [place] and let her know that her urine culture showed no bacterial growth.  Give her an appt to follow up with me in office.  I will repeat U/a.  Give her a 30 min appt.            Her MRI result is still pending ------

## 2014-06-05 NOTE — Telephone Encounter (Signed)
Left message on mobile for pt to call office regarding MRI

## 2014-06-06 ENCOUNTER — Telehealth: Payer: Self-pay | Admitting: Internal Medicine

## 2014-06-06 NOTE — Telephone Encounter (Signed)
Spoke with Judy [name] 6/29 approx 4 pm and gave her MRI results.  She is leaving to go out of town in am.  She has upcoming appt with Dr. Lynann Bologna next week  Advised to do simple movements of Left shoulder as she can tolerate to prevent a frozen shoulder.  Judy [name] voices understanding

## 2014-06-08 ENCOUNTER — Telehealth: Payer: Self-pay | Admitting: *Deleted

## 2014-06-08 NOTE — Telephone Encounter (Signed)
Pt called in stated she would call back with follow up appt about the repeat u/a is Sx got worse.

## 2014-06-21 ENCOUNTER — Other Ambulatory Visit: Payer: Self-pay | Admitting: Internal Medicine

## 2014-06-21 ENCOUNTER — Telehealth: Payer: Self-pay | Admitting: *Deleted

## 2014-06-21 NOTE — Telephone Encounter (Signed)
Judy [name] will be traveling for next 10 days and would like you to refill her Flexerill and HYDROcodone-acetaminophen    Please call her and let her know if we called in

## 2014-06-22 NOTE — Telephone Encounter (Signed)
Pharmacy sent request for Candesartan but pt called and also wants Flexeril and Hydrocodone due to her being out of town the next 10 days. See pending medications

## 2014-06-22 NOTE — Telephone Encounter (Signed)
Sent req to Dr. Chauncey Cruel for approval

## 2014-06-22 NOTE — Telephone Encounter (Signed)
Requested Medications     Medication name:  Name from pharmacy:  candesartan (ATACAND) 16 MG tablet  CANDESARTAN 16MG  TABLETS    The source prescription has been discontinued.    Sig: TAKE 1 TABLET BY MOUTH DAILY    Dispense: 90 tablet Refills: 0 Start: 06/21/2014  Class: Normal    Requested on: 06/21/2014    Originally ordered on: 12/21/2013 Last refill: 03/31/2014 Order History and Details

## 2014-06-25 MED ORDER — HYDROCODONE-ACETAMINOPHEN 5-325 MG PO TABS: 1.0000 | ORAL_TABLET | ORAL | Status: DC | PRN

## 2014-06-25 MED ORDER — CYCLOBENZAPRINE HCL 10 MG PO TABS: 10.0000 mg | ORAL_TABLET | Freq: Three times a day (TID) | ORAL | Status: DC | PRN

## 2014-06-26 NOTE — Telephone Encounter (Signed)
Left message on voicemail that pt would have to come & pick up hard copy of HYROCODONE

## 2014-06-26 NOTE — Telephone Encounter (Signed)
Pharm is reporting that rx needs to be a hard copy .

## 2014-06-28 ENCOUNTER — Ambulatory Visit: Payer: Federal, State, Local not specified - PPO | Attending: Orthopedic Surgery | Admitting: Physical Therapy

## 2014-06-28 DIAGNOSIS — IMO0001 Reserved for inherently not codable concepts without codable children: Secondary | ICD-10-CM | POA: Insufficient documentation

## 2014-06-28 DIAGNOSIS — S98139A Complete traumatic amputation of one unspecified lesser toe, initial encounter: Secondary | ICD-10-CM | POA: Insufficient documentation

## 2014-06-28 DIAGNOSIS — M542 Cervicalgia: Secondary | ICD-10-CM | POA: Diagnosis not present

## 2014-06-28 DIAGNOSIS — I10 Essential (primary) hypertension: Secondary | ICD-10-CM | POA: Insufficient documentation

## 2014-06-28 DIAGNOSIS — K219 Gastro-esophageal reflux disease without esophagitis: Secondary | ICD-10-CM | POA: Insufficient documentation

## 2014-06-28 DIAGNOSIS — M25519 Pain in unspecified shoulder: Secondary | ICD-10-CM | POA: Diagnosis not present

## 2014-06-29 ENCOUNTER — Other Ambulatory Visit: Payer: Self-pay | Admitting: Internal Medicine

## 2014-06-29 ENCOUNTER — Ambulatory Visit: Payer: Federal, State, Local not specified - PPO | Admitting: Physical Therapy

## 2014-06-29 DIAGNOSIS — IMO0001 Reserved for inherently not codable concepts without codable children: Secondary | ICD-10-CM | POA: Diagnosis not present

## 2014-06-29 NOTE — Telephone Encounter (Signed)
Requested Medications     Medication name:  Name from pharmacy:  hydrochlorothiazide (HYDRODIURIL) 12.5 MG tablet  HYDROCHLOROTHIAZIDE 12.5MG  TABLETS    Sig: TAKE 1 TABLET BY MOUTH DAILY    Dispense: 90 tablet Refills: 0 Start: 06/29/2014  Class: Normal    Requested on: 06/29/2014    Originally ordered on: 12/21/2013 Last refill: 03/31/2014 Order History and Details

## 2014-07-04 ENCOUNTER — Ambulatory Visit: Payer: Federal, State, Local not specified - PPO | Admitting: Physical Therapy

## 2014-07-04 DIAGNOSIS — IMO0001 Reserved for inherently not codable concepts without codable children: Secondary | ICD-10-CM | POA: Diagnosis not present

## 2014-07-06 ENCOUNTER — Ambulatory Visit: Payer: Federal, State, Local not specified - PPO | Admitting: Physical Therapy

## 2014-07-06 DIAGNOSIS — IMO0001 Reserved for inherently not codable concepts without codable children: Secondary | ICD-10-CM | POA: Diagnosis not present

## 2014-07-07 ENCOUNTER — Ambulatory Visit: Payer: Federal, State, Local not specified - PPO | Admitting: Rehabilitation

## 2014-07-07 DIAGNOSIS — IMO0001 Reserved for inherently not codable concepts without codable children: Secondary | ICD-10-CM | POA: Diagnosis not present

## 2014-07-10 ENCOUNTER — Ambulatory Visit: Payer: Federal, State, Local not specified - PPO | Attending: Orthopedic Surgery | Admitting: Rehabilitation

## 2014-07-10 DIAGNOSIS — M542 Cervicalgia: Secondary | ICD-10-CM | POA: Insufficient documentation

## 2014-07-10 DIAGNOSIS — IMO0001 Reserved for inherently not codable concepts without codable children: Secondary | ICD-10-CM | POA: Insufficient documentation

## 2014-07-10 DIAGNOSIS — M25519 Pain in unspecified shoulder: Secondary | ICD-10-CM | POA: Insufficient documentation

## 2014-07-14 ENCOUNTER — Ambulatory Visit: Payer: Federal, State, Local not specified - PPO | Admitting: Rehabilitation

## 2014-07-14 DIAGNOSIS — IMO0001 Reserved for inherently not codable concepts without codable children: Secondary | ICD-10-CM | POA: Diagnosis not present

## 2014-07-17 ENCOUNTER — Ambulatory Visit: Payer: Federal, State, Local not specified - PPO | Admitting: Physical Therapy

## 2014-07-17 DIAGNOSIS — IMO0001 Reserved for inherently not codable concepts without codable children: Secondary | ICD-10-CM | POA: Diagnosis not present

## 2014-07-18 ENCOUNTER — Ambulatory Visit: Payer: Federal, State, Local not specified - PPO | Admitting: Physical Therapy

## 2014-07-18 DIAGNOSIS — IMO0001 Reserved for inherently not codable concepts without codable children: Secondary | ICD-10-CM | POA: Diagnosis not present

## 2014-07-21 ENCOUNTER — Ambulatory Visit: Payer: Federal, State, Local not specified - PPO | Admitting: Physical Therapy

## 2014-07-24 ENCOUNTER — Ambulatory Visit: Payer: Federal, State, Local not specified - PPO | Admitting: Physical Therapy

## 2014-07-24 DIAGNOSIS — IMO0001 Reserved for inherently not codable concepts without codable children: Secondary | ICD-10-CM | POA: Diagnosis not present

## 2014-07-25 ENCOUNTER — Ambulatory Visit: Payer: Federal, State, Local not specified - PPO | Admitting: Physical Therapy

## 2014-07-25 DIAGNOSIS — IMO0001 Reserved for inherently not codable concepts without codable children: Secondary | ICD-10-CM | POA: Diagnosis not present

## 2014-07-31 ENCOUNTER — Ambulatory Visit: Payer: Federal, State, Local not specified - PPO | Admitting: Physical Therapy

## 2014-07-31 DIAGNOSIS — IMO0001 Reserved for inherently not codable concepts without codable children: Secondary | ICD-10-CM | POA: Diagnosis not present

## 2014-08-01 ENCOUNTER — Ambulatory Visit: Payer: Federal, State, Local not specified - PPO | Admitting: Physical Therapy

## 2014-08-01 DIAGNOSIS — IMO0001 Reserved for inherently not codable concepts without codable children: Secondary | ICD-10-CM | POA: Diagnosis not present

## 2014-08-17 ENCOUNTER — Ambulatory Visit (INDEPENDENT_AMBULATORY_CARE_PROVIDER_SITE_OTHER): Payer: Federal, State, Local not specified - PPO | Admitting: Internal Medicine

## 2014-08-17 ENCOUNTER — Ambulatory Visit: Payer: Federal, State, Local not specified - PPO | Attending: Orthopedic Surgery | Admitting: Physical Therapy

## 2014-08-17 ENCOUNTER — Encounter: Payer: Self-pay | Admitting: Internal Medicine

## 2014-08-17 VITALS — BP 113/66 | HR 72 | Temp 97.9°F | Resp 16 | Ht 68.0 in | Wt 247.0 lb

## 2014-08-17 DIAGNOSIS — M25519 Pain in unspecified shoulder: Secondary | ICD-10-CM | POA: Diagnosis not present

## 2014-08-17 DIAGNOSIS — M75102 Unspecified rotator cuff tear or rupture of left shoulder, not specified as traumatic: Secondary | ICD-10-CM | POA: Insufficient documentation

## 2014-08-17 DIAGNOSIS — IMO0001 Reserved for inherently not codable concepts without codable children: Secondary | ICD-10-CM | POA: Diagnosis not present

## 2014-08-17 DIAGNOSIS — F3289 Other specified depressive episodes: Secondary | ICD-10-CM

## 2014-08-17 DIAGNOSIS — F329 Major depressive disorder, single episode, unspecified: Secondary | ICD-10-CM | POA: Insufficient documentation

## 2014-08-17 DIAGNOSIS — M2559 Pain in other specified joint: Secondary | ICD-10-CM | POA: Diagnosis not present

## 2014-08-17 DIAGNOSIS — M542 Cervicalgia: Secondary | ICD-10-CM | POA: Insufficient documentation

## 2014-08-17 DIAGNOSIS — F32A Depression, unspecified: Secondary | ICD-10-CM | POA: Insufficient documentation

## 2014-08-17 DIAGNOSIS — M2550 Pain in unspecified joint: Secondary | ICD-10-CM

## 2014-08-17 DIAGNOSIS — R252 Cramp and spasm: Secondary | ICD-10-CM

## 2014-08-17 DIAGNOSIS — M25673 Stiffness of unspecified ankle, not elsewhere classified: Secondary | ICD-10-CM

## 2014-08-17 DIAGNOSIS — M2021 Hallux rigidus, right foot: Secondary | ICD-10-CM

## 2014-08-17 DIAGNOSIS — M25676 Stiffness of unspecified foot, not elsewhere classified: Secondary | ICD-10-CM

## 2014-08-17 HISTORY — DX: Unspecified rotator cuff tear or rupture of left shoulder, not specified as traumatic: M75.102

## 2014-08-17 MED ORDER — SERTRALINE HCL 25 MG PO TABS: ORAL_TABLET | ORAL | Status: DC

## 2014-08-17 MED ORDER — SERTRALINE HCL 25 MG PO TABS: 50.0000 mg | ORAL_TABLET | Freq: Every day | ORAL | Status: DC
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
## 2014-08-17 NOTE — Patient Instructions (Signed)
Will refer to Dr. Metta Clines neurology   Will refer to [facility] therapist    Take medication as prescribed    See me in 4-5 weeks or call if any worsening

## 2014-08-21 ENCOUNTER — Ambulatory Visit (INDEPENDENT_AMBULATORY_CARE_PROVIDER_SITE_OTHER): Payer: Federal, State, Local not specified - PPO | Admitting: Psychology

## 2014-08-21 ENCOUNTER — Ambulatory Visit: Payer: Federal, State, Local not specified - PPO | Admitting: Physical Therapy

## 2014-08-21 DIAGNOSIS — F4323 Adjustment disorder with mixed anxiety and depressed mood: Secondary | ICD-10-CM

## 2014-08-21 DIAGNOSIS — IMO0001 Reserved for inherently not codable concepts without codable children: Secondary | ICD-10-CM | POA: Diagnosis not present

## 2014-08-22 ENCOUNTER — Telehealth: Payer: Self-pay

## 2014-08-22 NOTE — Telephone Encounter (Signed)
I called Judy [name] to let her know I had talked with [facility] [phone] and they said for her to call them and they would get her set up for an appointment, and that her insurance [employer] was in network with them.

## 2014-08-23 ENCOUNTER — Ambulatory Visit: Payer: Federal, State, Local not specified - PPO | Admitting: Rehabilitation

## 2014-08-28 ENCOUNTER — Ambulatory Visit: Payer: Federal, State, Local not specified - PPO | Admitting: Physical Therapy

## 2014-08-28 ENCOUNTER — Ambulatory Visit (INDEPENDENT_AMBULATORY_CARE_PROVIDER_SITE_OTHER): Payer: Federal, State, Local not specified - PPO | Admitting: Psychology

## 2014-08-28 DIAGNOSIS — F4323 Adjustment disorder with mixed anxiety and depressed mood: Secondary | ICD-10-CM

## 2014-08-28 DIAGNOSIS — IMO0001 Reserved for inherently not codable concepts without codable children: Secondary | ICD-10-CM | POA: Diagnosis not present

## 2014-08-31 ENCOUNTER — Ambulatory Visit: Payer: Federal, State, Local not specified - PPO | Admitting: Physical Therapy

## 2014-09-11 ENCOUNTER — Ambulatory Visit (INDEPENDENT_AMBULATORY_CARE_PROVIDER_SITE_OTHER): Payer: Federal, State, Local not specified - PPO | Admitting: Psychology

## 2014-09-11 ENCOUNTER — Ambulatory Visit: Payer: Federal, State, Local not specified - PPO | Admitting: Rehabilitation

## 2014-09-11 DIAGNOSIS — F4323 Adjustment disorder with mixed anxiety and depressed mood: Secondary | ICD-10-CM

## 2014-09-14 ENCOUNTER — Ambulatory Visit: Payer: Federal, State, Local not specified - PPO | Admitting: Physical Therapy

## 2014-09-14 ENCOUNTER — Encounter: Payer: Self-pay | Admitting: Internal Medicine

## 2014-09-14 ENCOUNTER — Ambulatory Visit (INDEPENDENT_AMBULATORY_CARE_PROVIDER_SITE_OTHER): Payer: Federal, State, Local not specified - PPO | Admitting: Internal Medicine

## 2014-09-14 VITALS — BP 119/69 | HR 78 | Resp 16 | Ht 68.0 in | Wt 247.0 lb

## 2014-09-14 DIAGNOSIS — F4321 Adjustment disorder with depressed mood: Secondary | ICD-10-CM

## 2014-09-14 DIAGNOSIS — M75102 Unspecified rotator cuff tear or rupture of left shoulder, not specified as traumatic: Secondary | ICD-10-CM | POA: Diagnosis not present

## 2014-09-14 DIAGNOSIS — R197 Diarrhea, unspecified: Secondary | ICD-10-CM
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
## 2014-09-19 ENCOUNTER — Other Ambulatory Visit: Payer: Self-pay | Admitting: Internal Medicine

## 2014-09-19 NOTE — Telephone Encounter (Signed)
Refill request
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
## 2014-09-20 ENCOUNTER — Encounter: Payer: Self-pay | Admitting: Internal Medicine

## 2014-09-20 ENCOUNTER — Ambulatory Visit (INDEPENDENT_AMBULATORY_CARE_PROVIDER_SITE_OTHER): Payer: Federal, State, Local not specified - PPO | Admitting: Internal Medicine

## 2014-09-20 VITALS — BP 122/70 | HR 89 | Resp 16 | Ht 68.0 in | Wt 252.0 lb

## 2014-09-20 DIAGNOSIS — L989 Disorder of the skin and subcutaneous tissue, unspecified: Secondary | ICD-10-CM | POA: Diagnosis not present

## 2014-09-20 MED ORDER — AMOXICILLIN-POT CLAVULANATE 875-125 MG PO TABS: 1.0000 | ORAL_TABLET | Freq: Two times a day (BID) | ORAL | Status: DC

## 2014-09-20 MED ORDER — KETOCONAZOLE 2 % EX SHAM: 1.0000 "application " | MEDICATED_SHAMPOO | CUTANEOUS | Status: DC

## 2014-09-23 NOTE — Patient Instructions (Signed)
Use meds as directed  Will set up follow up with dermatology

## 2014-09-29 ENCOUNTER — Ambulatory Visit (INDEPENDENT_AMBULATORY_CARE_PROVIDER_SITE_OTHER): Payer: Federal, State, Local not specified - PPO | Admitting: Psychology

## 2014-09-29 ENCOUNTER — Encounter: Payer: Self-pay | Admitting: Neurology

## 2014-09-29 ENCOUNTER — Ambulatory Visit (INDEPENDENT_AMBULATORY_CARE_PROVIDER_SITE_OTHER): Payer: Federal, State, Local not specified - PPO | Admitting: Neurology

## 2014-09-29 VITALS — BP 92/58 | HR 76 | Ht 68.0 in | Wt 252.0 lb

## 2014-09-29 DIAGNOSIS — R51 Headache: Secondary | ICD-10-CM

## 2014-09-29 DIAGNOSIS — F4323 Adjustment disorder with mixed anxiety and depressed mood: Secondary | ICD-10-CM

## 2014-09-29 DIAGNOSIS — G44329 Chronic post-traumatic headache, not intractable: Secondary | ICD-10-CM

## 2014-09-29 DIAGNOSIS — G4486 Cervicogenic headache: Secondary | ICD-10-CM

## 2014-09-29 DIAGNOSIS — R4189 Other symptoms and signs involving cognitive functions and awareness: Secondary | ICD-10-CM

## 2014-09-29 DIAGNOSIS — F4310 Post-traumatic stress disorder, unspecified: Secondary | ICD-10-CM

## 2014-09-29 NOTE — Patient Instructions (Addendum)
The headaches are likely coming from the neck.  The memory problems are likely related to the PTSD.  1.  We will get an MRI of the brain with and without contrast. [facility]  10/17/14 @12 :45pm  2.  Start the zoloft as it will help with both depression and the headaches 3.  Don't use hydrocodone for headaches.  Use NSAIDs or acetaminophen but no more than 2 days out of the week.  Flexeril for the neck and headache as well. 4.  Will give you sheet on sleep hygiene 5.  Follow up in 3 months
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
## 2014-10-09 ENCOUNTER — Encounter: Payer: Self-pay | Admitting: Neurology

## 2014-10-16 ENCOUNTER — Ambulatory Visit (INDEPENDENT_AMBULATORY_CARE_PROVIDER_SITE_OTHER): Payer: Federal, State, Local not specified - PPO | Admitting: Psychology

## 2014-10-16 ENCOUNTER — Ambulatory Visit: Payer: Federal, State, Local not specified - PPO | Admitting: Psychology

## 2014-10-16 DIAGNOSIS — F4323 Adjustment disorder with mixed anxiety and depressed mood: Secondary | ICD-10-CM

## 2014-10-17 ENCOUNTER — Ambulatory Visit (HOSPITAL_COMMUNITY)
Admission: RE | Admit: 2014-10-17 | Discharge: 2014-10-17 | Disposition: A | Discharge status: Home / Self Care | Payer: Federal, State, Local not specified - PPO | Source: Ambulatory Visit | Attending: Neurology | Admitting: Neurology

## 2014-10-17 DIAGNOSIS — F329 Major depressive disorder, single episode, unspecified: Secondary | ICD-10-CM | POA: Diagnosis present

## 2014-10-17 DIAGNOSIS — R51 Headache: Secondary | ICD-10-CM | POA: Diagnosis present

## 2014-10-17 DIAGNOSIS — R4189 Other symptoms and signs involving cognitive functions and awareness: Secondary | ICD-10-CM

## 2014-10-17 DIAGNOSIS — R5381 Other malaise: Secondary | ICD-10-CM | POA: Diagnosis present

## 2014-10-17 DIAGNOSIS — G44329 Chronic post-traumatic headache, not intractable: Secondary | ICD-10-CM

## 2014-10-17 DIAGNOSIS — F4310 Post-traumatic stress disorder, unspecified: Secondary | ICD-10-CM

## 2014-10-17 DIAGNOSIS — G4486 Cervicogenic headache: Secondary | ICD-10-CM

## 2014-10-17 LAB — CREATININE, SERUM
CREATININE: 0.85 mg/dL (ref 0.50–1.10)
GFR calc Af Amer: 84 mL/min — ABNORMAL LOW (ref >90)
GFR, EST NON AFRICAN AMERICAN: 72 mL/min — AB (ref >90)

## 2014-10-17 MED ORDER — GADOBENATE DIMEGLUMINE 529 MG/ML IV SOLN
20.0000 mL | Freq: Once | INTRAVENOUS | Status: AC | PRN
  Administered 2014-10-17: 20 mL via INTRAVENOUS

## 2014-10-18 ENCOUNTER — Telehealth: Payer: Self-pay | Admitting: *Deleted

## 2014-10-18 NOTE — Telephone Encounter (Signed)
-----   Message from Dudley Major, DO sent at 10/18/2014  7:02 AM EST ----- MRI of the brain shows nothing that would be causing the headaches.  It reveals some very mild chronic changes due to age and history of hypertension and hyperlipidemia, but the scan is really unremarkable. ----- Message -----    From: Rad Results In Interface    Sent: 10/17/2014   2:48 PM      To: Dudley Major, DO

## 2014-10-18 NOTE — Telephone Encounter (Signed)
Left message on voicemail normal MRI

## 2014-10-23 ENCOUNTER — Ambulatory Visit (INDEPENDENT_AMBULATORY_CARE_PROVIDER_SITE_OTHER): Payer: Federal, State, Local not specified - PPO | Admitting: Psychology

## 2014-10-23 DIAGNOSIS — F4323 Adjustment disorder with mixed anxiety and depressed mood: Secondary | ICD-10-CM

## 2014-10-24 ENCOUNTER — Telehealth: Payer: Self-pay

## 2014-10-24 NOTE — Telephone Encounter (Signed)
[facility],  Appointment made with Dr Renda Rolls [phone] for 12/15/2014 arrive at 10:45 for 11:00 appointment bring ID,insurance card and medications  Judy [name] at Dr Tomasa Hosteller office --

## 2014-10-30 ENCOUNTER — Ambulatory Visit (INDEPENDENT_AMBULATORY_CARE_PROVIDER_SITE_OTHER): Payer: Federal, State, Local not specified - PPO | Admitting: Psychology

## 2014-10-30 DIAGNOSIS — F4323 Adjustment disorder with mixed anxiety and depressed mood: Secondary | ICD-10-CM

## 2014-11-08 ENCOUNTER — Ambulatory Visit (INDEPENDENT_AMBULATORY_CARE_PROVIDER_SITE_OTHER): Payer: Federal, State, Local not specified - PPO | Admitting: Psychology

## 2014-11-08 DIAGNOSIS — F4323 Adjustment disorder with mixed anxiety and depressed mood: Secondary | ICD-10-CM

## 2014-11-10 ENCOUNTER — Other Ambulatory Visit: Payer: Self-pay | Admitting: Internal Medicine

## 2014-11-13 NOTE — Telephone Encounter (Signed)
Refill request

## 2014-11-14 NOTE — Telephone Encounter (Signed)
RX called into [facility]

## 2014-11-22 ENCOUNTER — Ambulatory Visit (INDEPENDENT_AMBULATORY_CARE_PROVIDER_SITE_OTHER): Payer: Federal, State, Local not specified - PPO | Admitting: Psychology

## 2014-11-22 DIAGNOSIS — F4323 Adjustment disorder with mixed anxiety and depressed mood: Secondary | ICD-10-CM

## 2014-11-29 ENCOUNTER — Other Ambulatory Visit: Payer: Self-pay | Admitting: *Deleted

## 2014-11-29 ENCOUNTER — Ambulatory Visit (INDEPENDENT_AMBULATORY_CARE_PROVIDER_SITE_OTHER): Payer: Federal, State, Local not specified - PPO | Admitting: Psychology

## 2014-11-29 DIAGNOSIS — F4323 Adjustment disorder with mixed anxiety and depressed mood: Secondary | ICD-10-CM

## 2014-11-29 MED ORDER — SERTRALINE HCL 50 MG PO TABS: 50.0000 mg | ORAL_TABLET | Freq: Every day | ORAL | Status: DC

## 2014-11-29 NOTE — Telephone Encounter (Signed)
Judy [name] is requesting a refill on her Zoloft

## 2014-12-11 ENCOUNTER — Encounter: Payer: Self-pay | Admitting: Internal Medicine

## 2014-12-11 ENCOUNTER — Ambulatory Visit (INDEPENDENT_AMBULATORY_CARE_PROVIDER_SITE_OTHER): Payer: Federal, State, Local not specified - PPO | Admitting: Internal Medicine

## 2014-12-11 VITALS — BP 122/86 | HR 86 | Resp 16 | Ht 68.0 in | Wt 242.0 lb

## 2014-12-11 DIAGNOSIS — I10 Essential (primary) hypertension: Secondary | ICD-10-CM

## 2014-12-11 DIAGNOSIS — R55 Syncope and collapse: Secondary | ICD-10-CM | POA: Diagnosis not present

## 2014-12-11 LAB — CBC WITH DIFFERENTIAL/PLATELET
Basophils Absolute: 0 10*3/uL (ref 0.0–0.1)
Basophils Relative: 0 % (ref 0–1)
EOS PCT: 1 % (ref 0–5)
Eosinophils Absolute: 0.1 10*3/uL (ref 0.0–0.7)
HCT: 39.3 % (ref 36.0–46.0)
Hemoglobin: 12.9 g/dL (ref 12.0–15.0)
Lymphocytes Relative: 26 % (ref 12–46)
Lymphs Abs: 2.5 10*3/uL (ref 0.7–4.0)
MCH: 26.8 pg (ref 26.0–34.0)
MCHC: 32.8 g/dL (ref 30.0–36.0)
MCV: 81.5 fL (ref 78.0–100.0)
MPV: 8.9 fL (ref 8.6–12.4)
Monocytes Absolute: 0.7 10*3/uL (ref 0.1–1.0)
Monocytes Relative: 7 % (ref 3–12)
NEUTROS ABS: 6.4 10*3/uL (ref 1.7–7.7)
Neutrophils Relative %: 66 % (ref 43–77)
PLATELETS: 382 10*3/uL (ref 150–400)
RBC: 4.82 MIL/uL (ref 3.87–5.11)
RDW: 14.3 % (ref 11.5–15.5)
WBC: 9.7 10*3/uL (ref 4.0–10.5)

## 2014-12-11 LAB — COMPREHENSIVE METABOLIC PANEL WITH GFR
ALT: 13 U/L (ref 0–35)
AST: 19 U/L (ref 0–37)
Albumin: 4.2 g/dL (ref 3.5–5.2)
Alkaline Phosphatase: 79 U/L (ref 39–117)
BUN: 13 mg/dL (ref 6–23)
CO2: 28 meq/L (ref 19–32)
Calcium: 9.6 mg/dL (ref 8.4–10.5)
Chloride: 99 meq/L (ref 96–112)
Creat: 0.86 mg/dL (ref 0.50–1.10)
Glucose, Bld: 94 mg/dL (ref 70–99)
Potassium: 3.9 meq/L (ref 3.5–5.3)
Sodium: 138 meq/L (ref 135–145)
Total Bilirubin: 0.5 mg/dL (ref 0.2–1.2)
Total Protein: 7.6 g/dL (ref 6.0–8.3)

## 2014-12-11 LAB — TSH: TSH: 1.131 u[IU]/mL (ref 0.350–4.500)

## 2014-12-11 MED ORDER — CANDESARTAN CILEXETIL 16 MG PO TABS: 16.0000 mg | ORAL_TABLET | Freq: Every day | ORAL | Status: DC
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
## 2014-12-11 NOTE — Patient Instructions (Signed)
Will set up referral to cardiology  If any further fainting ,  Call 911 and get to ER

## 2014-12-12 ENCOUNTER — Encounter: Payer: Self-pay | Admitting: Cardiovascular Disease

## 2014-12-12 ENCOUNTER — Ambulatory Visit: Payer: Federal, State, Local not specified - PPO | Admitting: Cardiovascular Disease

## 2014-12-12 ENCOUNTER — Ambulatory Visit (INDEPENDENT_AMBULATORY_CARE_PROVIDER_SITE_OTHER): Payer: Federal, State, Local not specified - PPO | Admitting: Cardiovascular Disease

## 2014-12-12 VITALS — BP 124/84 | HR 74 | Ht 68.0 in | Wt 243.0 lb

## 2014-12-12 DIAGNOSIS — F338 Other recurrent depressive disorders: Secondary | ICD-10-CM

## 2014-12-12 DIAGNOSIS — R55 Syncope and collapse: Secondary | ICD-10-CM

## 2014-12-12 DIAGNOSIS — F39 Unspecified mood [affective] disorder: Secondary | ICD-10-CM

## 2014-12-12 DIAGNOSIS — I10 Essential (primary) hypertension: Secondary | ICD-10-CM

## 2014-12-12 HISTORY — DX: Syncope and collapse: R55

## 2014-12-12 NOTE — Assessment & Plan Note (Signed)
Indicates getting some meds from psychologist and some from primary Polypharmacy contributing to risk of syncope  Would simplify  On zoloft now

## 2014-12-12 NOTE — Progress Notes (Signed)
Mailed a copy of labs to patient-eh 

## 2014-12-12 NOTE — Patient Instructions (Signed)

## 2014-12-12 NOTE — Assessment & Plan Note (Signed)
Well controlled.  Continue current medications and low sodium Dash type diet.    
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
## 2014-12-12 NOTE — Assessment & Plan Note (Signed)
Classic vasovagal prodrome  Normal exam and ECG mitigates against cardiac etiology  F/U echo was ok in 2014  Polypharmacy should be addressed by psychologist and primary Should avoid multiple CNS acting agents and diuretics that lead to dehydration

## 2014-12-13 ENCOUNTER — Ambulatory Visit (INDEPENDENT_AMBULATORY_CARE_PROVIDER_SITE_OTHER): Payer: Federal, State, Local not specified - PPO | Admitting: Psychology

## 2014-12-13 ENCOUNTER — Ambulatory Visit (HOSPITAL_COMMUNITY): Payer: Federal, State, Local not specified - PPO | Attending: Cardiovascular Disease | Admitting: Radiology

## 2014-12-13 DIAGNOSIS — F4323 Adjustment disorder with mixed anxiety and depressed mood: Secondary | ICD-10-CM

## 2014-12-13 DIAGNOSIS — R55 Syncope and collapse: Secondary | ICD-10-CM

## 2014-12-13 NOTE — Progress Notes (Signed)
Echocardiogram performed.  

## 2014-12-22 ENCOUNTER — Ambulatory Visit (INDEPENDENT_AMBULATORY_CARE_PROVIDER_SITE_OTHER): Payer: Federal, State, Local not specified - PPO | Admitting: Psychology

## 2014-12-22 DIAGNOSIS — F4323 Adjustment disorder with mixed anxiety and depressed mood: Secondary | ICD-10-CM

## 2015-01-01 ENCOUNTER — Ambulatory Visit: Payer: Federal, State, Local not specified - PPO | Admitting: Neurology

## 2015-01-04 ENCOUNTER — Ambulatory Visit: Payer: Federal, State, Local not specified - PPO | Admitting: Interventional Cardiology

## 2015-01-05 ENCOUNTER — Ambulatory Visit (INDEPENDENT_AMBULATORY_CARE_PROVIDER_SITE_OTHER): Payer: Federal, State, Local not specified - PPO | Admitting: Psychology

## 2015-01-05 DIAGNOSIS — F4323 Adjustment disorder with mixed anxiety and depressed mood: Secondary | ICD-10-CM

## 2015-01-10 ENCOUNTER — Ambulatory Visit (INDEPENDENT_AMBULATORY_CARE_PROVIDER_SITE_OTHER): Payer: Federal, State, Local not specified - PPO | Admitting: Neurology

## 2015-01-10 VITALS — BP 138/72 | HR 80 | Temp 98.4°F | Resp 18 | Ht 68.0 in | Wt 247.7 lb

## 2015-01-10 DIAGNOSIS — F329 Major depressive disorder, single episode, unspecified: Secondary | ICD-10-CM

## 2015-01-10 DIAGNOSIS — G4486 Cervicogenic headache: Secondary | ICD-10-CM

## 2015-01-10 DIAGNOSIS — R51 Headache: Secondary | ICD-10-CM

## 2015-01-10 DIAGNOSIS — F411 Generalized anxiety disorder: Secondary | ICD-10-CM

## 2015-01-10 DIAGNOSIS — F32A Depression, unspecified: Secondary | ICD-10-CM

## 2015-01-10 MED ORDER — SERTRALINE HCL 25 MG PO TABS: 75.0000 mg | ORAL_TABLET | Freq: Every day | ORAL | Status: DC

## 2015-01-10 NOTE — Patient Instructions (Signed)
1.  Increase Zoloft to 75mg  (will prescribe 25mg  tablets so take 3 tablets) daily 2.  Use flexeril for neck and headache pain.  Limit use to no more than 2 days out of the week 3.  Try to limit use of celebrex to no more than 2 days out of week if possible 4.  Call in 4 weeks with update.  Follow up in 3 months.

## 2015-01-11 ENCOUNTER — Encounter: Payer: Self-pay | Admitting: Neurology
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
## 2015-01-15 ENCOUNTER — Encounter: Payer: Self-pay | Admitting: *Deleted

## 2015-01-17 ENCOUNTER — Ambulatory Visit (INDEPENDENT_AMBULATORY_CARE_PROVIDER_SITE_OTHER): Payer: Federal, State, Local not specified - PPO | Admitting: Psychology

## 2015-01-17 DIAGNOSIS — F4323 Adjustment disorder with mixed anxiety and depressed mood: Secondary | ICD-10-CM

## 2015-01-19 ENCOUNTER — Other Ambulatory Visit: Payer: Self-pay | Admitting: Internal Medicine

## 2015-01-26 ENCOUNTER — Ambulatory Visit (INDEPENDENT_AMBULATORY_CARE_PROVIDER_SITE_OTHER): Payer: Federal, State, Local not specified - PPO | Admitting: Psychology

## 2015-01-26 DIAGNOSIS — F4323 Adjustment disorder with mixed anxiety and depressed mood: Secondary | ICD-10-CM

## 2015-02-09 ENCOUNTER — Ambulatory Visit (INDEPENDENT_AMBULATORY_CARE_PROVIDER_SITE_OTHER): Payer: Federal, State, Local not specified - PPO | Admitting: Psychology

## 2015-02-09 DIAGNOSIS — F4323 Adjustment disorder with mixed anxiety and depressed mood: Secondary | ICD-10-CM

## 2015-02-26 ENCOUNTER — Ambulatory Visit (INDEPENDENT_AMBULATORY_CARE_PROVIDER_SITE_OTHER): Payer: Federal, State, Local not specified - PPO | Admitting: Psychology

## 2015-02-26 DIAGNOSIS — F4323 Adjustment disorder with mixed anxiety and depressed mood: Secondary | ICD-10-CM

## 2015-03-03 ENCOUNTER — Other Ambulatory Visit: Payer: Self-pay | Admitting: Internal Medicine

## 2015-03-05 ENCOUNTER — Other Ambulatory Visit: Payer: Self-pay | Admitting: Internal Medicine

## 2015-03-05 NOTE — Telephone Encounter (Signed)
Refill request

## 2015-03-07 ENCOUNTER — Ambulatory Visit (INDEPENDENT_AMBULATORY_CARE_PROVIDER_SITE_OTHER): Payer: Federal, State, Local not specified - PPO | Admitting: Psychology

## 2015-03-07 DIAGNOSIS — F4323 Adjustment disorder with mixed anxiety and depressed mood: Secondary | ICD-10-CM | POA: Diagnosis not present

## 2015-03-13 ENCOUNTER — Encounter: Payer: Self-pay | Admitting: *Deleted

## 2015-03-16 ENCOUNTER — Other Ambulatory Visit: Payer: Self-pay | Admitting: *Deleted

## 2015-03-16 MED ORDER — SERTRALINE HCL 25 MG PO TABS: 75.0000 mg | ORAL_TABLET | Freq: Every day | ORAL | Status: DC

## 2015-03-21 ENCOUNTER — Ambulatory Visit (INDEPENDENT_AMBULATORY_CARE_PROVIDER_SITE_OTHER): Payer: Federal, State, Local not specified - PPO | Admitting: Psychology

## 2015-03-21 DIAGNOSIS — F4323 Adjustment disorder with mixed anxiety and depressed mood: Secondary | ICD-10-CM

## 2015-03-30 ENCOUNTER — Ambulatory Visit (INDEPENDENT_AMBULATORY_CARE_PROVIDER_SITE_OTHER): Payer: Federal, State, Local not specified - PPO | Admitting: Psychology

## 2015-03-30 DIAGNOSIS — F4323 Adjustment disorder with mixed anxiety and depressed mood: Secondary | ICD-10-CM | POA: Diagnosis not present

## 2015-04-02 ENCOUNTER — Other Ambulatory Visit: Payer: Self-pay | Admitting: *Deleted

## 2015-04-02 DIAGNOSIS — R7301 Impaired fasting glucose: Secondary | ICD-10-CM

## 2015-04-02 LAB — BASIC METABOLIC PANEL
BUN: 18 mg/dL (ref 6–23)
CHLORIDE: 102 meq/L (ref 96–112)
CO2: 27 mEq/L (ref 19–32)
Calcium: 9.7 mg/dL (ref 8.4–10.5)
Creat: 0.8 mg/dL (ref 0.50–1.10)
Glucose, Bld: 84 mg/dL (ref 70–99)
POTASSIUM: 4.1 meq/L (ref 3.5–5.3)
Sodium: 140 mEq/L (ref 135–145)

## 2015-04-02 LAB — HEMOGLOBIN A1C
Hgb A1c MFr Bld: 6.1 % — ABNORMAL HIGH (ref <5.7)
Mean Plasma Glucose: 128 mg/dL — ABNORMAL HIGH (ref <117)

## 2015-04-05 ENCOUNTER — Ambulatory Visit (INDEPENDENT_AMBULATORY_CARE_PROVIDER_SITE_OTHER): Payer: Federal, State, Local not specified - PPO | Admitting: Internal Medicine

## 2015-04-05 ENCOUNTER — Encounter: Payer: Self-pay | Admitting: Internal Medicine

## 2015-04-05 VITALS — BP 126/75 | HR 74 | Resp 16 | Ht 68.0 in | Wt 237.0 lb

## 2015-04-05 DIAGNOSIS — F4323 Adjustment disorder with mixed anxiety and depressed mood: Secondary | ICD-10-CM

## 2015-04-05 DIAGNOSIS — I10 Essential (primary) hypertension: Secondary | ICD-10-CM

## 2015-04-05 DIAGNOSIS — R55 Syncope and collapse: Secondary | ICD-10-CM

## 2015-04-05 MED ORDER — LANSOPRAZOLE 30 MG PO CPDR: 30.0000 mg | DELAYED_RELEASE_CAPSULE | Freq: Every day | ORAL | Status: DC

## 2015-04-05 MED ORDER — HYDROCHLOROTHIAZIDE 12.5 MG PO TABS: 12.5000 mg | ORAL_TABLET | Freq: Every day | ORAL | Status: DC

## 2015-04-05 MED ORDER — DOXYCYCLINE HYCLATE 100 MG PO TABS: 100.0000 mg | ORAL_TABLET | Freq: Two times a day (BID) | ORAL | Status: DC

## 2015-04-05 MED ORDER — CANDESARTAN CILEXETIL 16 MG PO TABS: 16.0000 mg | ORAL_TABLET | Freq: Every day | ORAL | Status: DC

## 2015-04-05 MED ORDER — ESZOPICLONE 2 MG PO TABS: 2.0000 mg | ORAL_TABLET | Freq: Every evening | ORAL | Status: DC | PRN

## 2015-04-05 MED ORDER — CYCLOBENZAPRINE HCL 10 MG PO TABS: 10.0000 mg | ORAL_TABLET | Freq: Three times a day (TID) | ORAL | Status: DC | PRN

## 2015-04-05 MED ORDER — SERTRALINE HCL 25 MG PO TABS: 75.0000 mg | ORAL_TABLET | Freq: Every day | ORAL | Status: DC
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
## 2015-04-11 ENCOUNTER — Ambulatory Visit: Payer: Federal, State, Local not specified - PPO | Admitting: Neurology

## 2015-04-11 ENCOUNTER — Ambulatory Visit (INDEPENDENT_AMBULATORY_CARE_PROVIDER_SITE_OTHER): Payer: Federal, State, Local not specified - PPO | Admitting: Psychology

## 2015-04-11 DIAGNOSIS — Z029 Encounter for administrative examinations, unspecified: Secondary | ICD-10-CM

## 2015-04-11 DIAGNOSIS — F4323 Adjustment disorder with mixed anxiety and depressed mood: Secondary | ICD-10-CM

## 2015-04-13 ENCOUNTER — Encounter: Payer: Self-pay | Admitting: Neurology

## 2015-04-13 ENCOUNTER — Telehealth: Payer: Self-pay | Admitting: Neurology

## 2015-04-13 NOTE — Telephone Encounter (Signed)
Pt no showed 04/11/15 appt w/ Dr. Tomi Likens. No show letter sent / Sherri S.

## 2015-04-15 ENCOUNTER — Other Ambulatory Visit: Payer: Self-pay | Admitting: Internal Medicine

## 2015-04-16 ENCOUNTER — Telehealth: Payer: Self-pay | Admitting: Neurology

## 2015-04-16 NOTE — Telephone Encounter (Signed)
Pt wants to talk to you about some test that Dr Tomi Likens had mentioned to her please call [phone]

## 2015-04-17 ENCOUNTER — Ambulatory Visit: Payer: Self-pay | Admitting: Neurology

## 2015-04-17 ENCOUNTER — Telehealth: Payer: Self-pay | Admitting: Neurology

## 2015-04-17 ENCOUNTER — Other Ambulatory Visit: Payer: Self-pay | Admitting: *Deleted

## 2015-04-17 DIAGNOSIS — G43009 Migraine without aura, not intractable, without status migrainosus: Secondary | ICD-10-CM

## 2015-04-17 DIAGNOSIS — R413 Other amnesia: Secondary | ICD-10-CM

## 2015-04-17 NOTE — Telephone Encounter (Signed)
Pt would like to talk to someone please call [phone]

## 2015-04-17 NOTE — Telephone Encounter (Signed)
Pt is returning susie call please call her at [phone]

## 2015-04-17 NOTE — Telephone Encounter (Signed)
I have sent referral to [facility]  Per patients request

## 2015-04-17 NOTE — Telephone Encounter (Signed)
I have called this patient each time she has called me she did not pick up  Phone I left message for her to call me back

## 2015-04-17 NOTE — Telephone Encounter (Signed)
I left message for patient to return my call.

## 2015-04-17 NOTE — Telephone Encounter (Signed)
Pt is returning your  Call.  

## 2015-04-17 NOTE — Telephone Encounter (Signed)
P 

## 2015-04-25 ENCOUNTER — Ambulatory Visit (INDEPENDENT_AMBULATORY_CARE_PROVIDER_SITE_OTHER): Payer: Federal, State, Local not specified - PPO | Admitting: Psychology

## 2015-04-25 DIAGNOSIS — F4323 Adjustment disorder with mixed anxiety and depressed mood: Secondary | ICD-10-CM | POA: Diagnosis not present

## 2015-05-09 ENCOUNTER — Ambulatory Visit (INDEPENDENT_AMBULATORY_CARE_PROVIDER_SITE_OTHER): Payer: Federal, State, Local not specified - PPO | Admitting: Psychology

## 2015-05-09 DIAGNOSIS — F4323 Adjustment disorder with mixed anxiety and depressed mood: Secondary | ICD-10-CM | POA: Diagnosis not present

## 2015-05-16 ENCOUNTER — Encounter: Payer: Self-pay | Admitting: Neurology

## 2015-05-16 ENCOUNTER — Ambulatory Visit (INDEPENDENT_AMBULATORY_CARE_PROVIDER_SITE_OTHER): Payer: Federal, State, Local not specified - PPO | Admitting: Neurology

## 2015-05-16 ENCOUNTER — Ambulatory Visit (INDEPENDENT_AMBULATORY_CARE_PROVIDER_SITE_OTHER): Payer: Federal, State, Local not specified - PPO | Admitting: Psychology

## 2015-05-16 VITALS — BP 120/70 | HR 82 | Ht 68.0 in | Wt 240.0 lb

## 2015-05-16 DIAGNOSIS — F4323 Adjustment disorder with mixed anxiety and depressed mood: Secondary | ICD-10-CM | POA: Diagnosis not present

## 2015-05-16 DIAGNOSIS — F4310 Post-traumatic stress disorder, unspecified: Secondary | ICD-10-CM

## 2015-05-16 DIAGNOSIS — G44219 Episodic tension-type headache, not intractable: Secondary | ICD-10-CM | POA: Diagnosis not present

## 2015-05-16 HISTORY — DX: Post-traumatic stress disorder, unspecified: F43.10

## 2015-05-16 MED ORDER — SERTRALINE HCL 100 MG PO TABS: 100.0000 mg | ORAL_TABLET | Freq: Every day | ORAL | Status: DC

## 2015-05-16 NOTE — Patient Instructions (Signed)
1.  Will increase Zoloft to 100mg  daily 2.  Follow up with the therapy was referred by Dr. Leonides Schanz 3.  Follow up in 3 months.
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
## 2015-05-21 ENCOUNTER — Encounter: Payer: Self-pay | Admitting: *Deleted

## 2015-05-28 ENCOUNTER — Ambulatory Visit (INDEPENDENT_AMBULATORY_CARE_PROVIDER_SITE_OTHER): Payer: Federal, State, Local not specified - PPO | Admitting: Psychology

## 2015-05-28 DIAGNOSIS — F4323 Adjustment disorder with mixed anxiety and depressed mood: Secondary | ICD-10-CM | POA: Diagnosis not present

## 2015-06-13 ENCOUNTER — Ambulatory Visit (INDEPENDENT_AMBULATORY_CARE_PROVIDER_SITE_OTHER): Payer: Federal, State, Local not specified - PPO | Admitting: Psychology

## 2015-06-13 DIAGNOSIS — F4323 Adjustment disorder with mixed anxiety and depressed mood: Secondary | ICD-10-CM | POA: Diagnosis not present

## 2015-06-14 ENCOUNTER — Other Ambulatory Visit: Payer: Self-pay | Admitting: *Deleted

## 2015-06-18 ENCOUNTER — Ambulatory Visit (INDEPENDENT_AMBULATORY_CARE_PROVIDER_SITE_OTHER): Payer: Federal, State, Local not specified - PPO | Admitting: Psychology

## 2015-06-18 DIAGNOSIS — F4323 Adjustment disorder with mixed anxiety and depressed mood: Secondary | ICD-10-CM

## 2015-06-25 ENCOUNTER — Ambulatory Visit (INDEPENDENT_AMBULATORY_CARE_PROVIDER_SITE_OTHER): Payer: Federal, State, Local not specified - PPO | Admitting: Psychology

## 2015-06-25 DIAGNOSIS — F4323 Adjustment disorder with mixed anxiety and depressed mood: Secondary | ICD-10-CM

## 2015-07-11 ENCOUNTER — Other Ambulatory Visit: Payer: Self-pay | Admitting: Neurology

## 2015-07-13 ENCOUNTER — Other Ambulatory Visit: Payer: Self-pay | Admitting: Neurology

## 2015-07-18 ENCOUNTER — Ambulatory Visit (INDEPENDENT_AMBULATORY_CARE_PROVIDER_SITE_OTHER): Payer: Federal, State, Local not specified - PPO | Admitting: Psychology

## 2015-07-18 ENCOUNTER — Other Ambulatory Visit: Payer: Self-pay

## 2015-07-18 DIAGNOSIS — F4323 Adjustment disorder with mixed anxiety and depressed mood: Secondary | ICD-10-CM

## 2015-07-19 ENCOUNTER — Encounter: Payer: Self-pay | Admitting: Internal Medicine

## 2015-07-19 ENCOUNTER — Telehealth: Payer: Self-pay | Admitting: *Deleted

## 2015-07-19 ENCOUNTER — Ambulatory Visit (INDEPENDENT_AMBULATORY_CARE_PROVIDER_SITE_OTHER): Payer: Federal, State, Local not specified - PPO | Admitting: Internal Medicine

## 2015-07-19 VITALS — BP 116/66 | HR 85 | Temp 98.6°F | Ht 68.0 in | Wt 243.1 lb

## 2015-07-19 DIAGNOSIS — R05 Cough: Secondary | ICD-10-CM

## 2015-07-19 DIAGNOSIS — M170 Bilateral primary osteoarthritis of knee: Secondary | ICD-10-CM | POA: Diagnosis not present

## 2015-07-19 DIAGNOSIS — R059 Cough, unspecified: Secondary | ICD-10-CM

## 2015-07-19 DIAGNOSIS — I10 Essential (primary) hypertension: Secondary | ICD-10-CM | POA: Diagnosis not present

## 2015-07-19 MED ORDER — CELECOXIB 200 MG PO CAPS: 200.0000 mg | ORAL_CAPSULE | Freq: Every day | ORAL | Status: DC

## 2015-07-19 MED ORDER — AZITHROMYCIN 250 MG PO TABS: ORAL_TABLET | ORAL | Status: DC

## 2015-07-19 MED ORDER — HYDROCODONE-HOMATROPINE 5-1.5 MG/5ML PO SYRP: 5.0000 mL | ORAL_SOLUTION | Freq: Every evening | ORAL | Status: DC | PRN
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
## 2015-07-19 NOTE — Telephone Encounter (Signed)
I did not prescribe this medication to her.

## 2015-07-19 NOTE — Telephone Encounter (Signed)
Patient is unable to afford Lunesta  Please advise

## 2015-07-19 NOTE — Progress Notes (Signed)
Pre visit review using our clinic review tool, if applicable. No additional management support is needed unless otherwise documented below in the visit note. 

## 2015-07-19 NOTE — Patient Instructions (Signed)
Rest, fluids , tylenol  For cough: Take Mucinex DM twice a day as needed until better At bedtime, take hydrocodone as needed. Will cause drowsiness  If  nasal congestion Use OTC Nasocort or Flonase : 2 nasal sprays on each side of the nose daily until you feel better    Take the antibiotic as prescribed  (zithromax)  Call if not gradually better over the next  10 days  Call anytime if the symptoms are severe

## 2015-07-19 NOTE — Telephone Encounter (Signed)
Patient states she needs an approval for the [facility]. She needs something more affordable Call back number [phone]

## 2015-07-19 NOTE — Assessment & Plan Note (Signed)
On chronic Celebrex, refill provided

## 2015-07-19 NOTE — Assessment & Plan Note (Signed)
Seems well controlled  

## 2015-07-19 NOTE — Telephone Encounter (Signed)
I spoke with patient she is aware that you do not prescribe this medication for her . She states her PCP has left the practices she prescribed enough to [facility] for a while but she has been unable to find a new pcp yet.

## 2015-07-23 ENCOUNTER — Ambulatory Visit (INDEPENDENT_AMBULATORY_CARE_PROVIDER_SITE_OTHER): Payer: Federal, State, Local not specified - PPO | Admitting: Psychology

## 2015-07-23 DIAGNOSIS — F4323 Adjustment disorder with mixed anxiety and depressed mood: Secondary | ICD-10-CM

## 2015-08-01 ENCOUNTER — Ambulatory Visit: Payer: Self-pay | Admitting: Neurology

## 2015-08-01 ENCOUNTER — Ambulatory Visit (INDEPENDENT_AMBULATORY_CARE_PROVIDER_SITE_OTHER): Payer: Federal, State, Local not specified - PPO | Admitting: Psychology

## 2015-08-01 DIAGNOSIS — F4323 Adjustment disorder with mixed anxiety and depressed mood: Secondary | ICD-10-CM

## 2015-08-10 ENCOUNTER — Ambulatory Visit (INDEPENDENT_AMBULATORY_CARE_PROVIDER_SITE_OTHER): Payer: Federal, State, Local not specified - PPO | Admitting: Psychology

## 2015-08-10 DIAGNOSIS — F4323 Adjustment disorder with mixed anxiety and depressed mood: Secondary | ICD-10-CM

## 2015-08-14 ENCOUNTER — Encounter: Payer: Self-pay | Admitting: Family Medicine

## 2015-08-14 ENCOUNTER — Ambulatory Visit (HOSPITAL_BASED_OUTPATIENT_CLINIC_OR_DEPARTMENT_OTHER)
Admission: RE | Admit: 2015-08-14 | Discharge: 2015-08-14 | Disposition: A | Discharge status: Home / Self Care | Payer: Federal, State, Local not specified - PPO | Source: Ambulatory Visit | Attending: Family Medicine | Admitting: Family Medicine

## 2015-08-14 ENCOUNTER — Ambulatory Visit (INDEPENDENT_AMBULATORY_CARE_PROVIDER_SITE_OTHER): Payer: Federal, State, Local not specified - PPO | Admitting: Family Medicine

## 2015-08-14 VITALS — HR 97 | Temp 98.6°F | Ht 68.0 in | Wt 245.4 lb

## 2015-08-14 DIAGNOSIS — R059 Cough, unspecified: Secondary | ICD-10-CM

## 2015-08-14 DIAGNOSIS — I10 Essential (primary) hypertension: Secondary | ICD-10-CM

## 2015-08-14 DIAGNOSIS — R05 Cough: Secondary | ICD-10-CM

## 2015-08-14 MED ORDER — ALBUTEROL SULFATE (2.5 MG/3ML) 0.083% IN NEBU
2.5000 mg | INHALATION_SOLUTION | Freq: Once | RESPIRATORY_TRACT | Status: AC
  Administered 2015-08-14: 2.5 mg via RESPIRATORY_TRACT

## 2015-08-14 MED ORDER — PREDNISONE 20 MG PO TABS: 20.0000 mg | ORAL_TABLET | Freq: Every day | ORAL | Status: DC

## 2015-08-14 MED ORDER — HYDROCODONE-HOMATROPINE 5-1.5 MG/5ML PO SYRP: 5.0000 mL | ORAL_SOLUTION | Freq: Every evening | ORAL | Status: DC | PRN

## 2015-08-14 MED ORDER — ALBUTEROL SULFATE HFA 108 (90 BASE) MCG/ACT IN AERS: 2.0000 | INHALATION_SPRAY | Freq: Four times a day (QID) | RESPIRATORY_TRACT | Status: DC | PRN

## 2015-08-14 MED ORDER — DOXYCYCLINE HYCLATE 100 MG PO TABS: 100.0000 mg | ORAL_TABLET | Freq: Two times a day (BID) | ORAL | Status: DC

## 2015-08-14 NOTE — Patient Instructions (Addendum)
[employer] at [email or web address] Probiotic 10 strain version Elderberry and aged or black garlic  Acute Bronchitis Bronchitis is inflammation of the airways that extend from the windpipe into the lungs (bronchi). The inflammation often causes mucus to develop. This leads to a cough, which is the most common symptom of bronchitis.  In acute bronchitis, the condition usually develops suddenly and goes away over time, usually in a couple weeks. Smoking, allergies, and asthma can make bronchitis worse. Repeated episodes of bronchitis may cause further lung problems.  CAUSES Acute bronchitis is most often caused by the same virus that causes a cold. The virus can spread from person to person (contagious) through coughing, sneezing, and touching contaminated objects. SIGNS AND SYMPTOMS   Cough.   Fever.   Coughing up mucus.   Body aches.   Chest congestion.   Chills.   Shortness of breath.   Sore throat.  DIAGNOSIS  Acute bronchitis is usually diagnosed through a physical exam. Your health care provider will also ask you questions about your medical history. Tests, such as chest X-rays, are sometimes done to rule out other conditions.  TREATMENT  Acute bronchitis usually goes away in a couple weeks. Oftentimes, no medical treatment is necessary. Medicines are sometimes given for relief of fever or cough. Antibiotic medicines are usually not needed but may be prescribed in certain situations. In some cases, an inhaler may be recommended to help reduce shortness of breath and control the cough. A cool mist vaporizer may also be used to help thin bronchial secretions and make it easier to clear the chest.  HOME CARE INSTRUCTIONS  Get plenty of rest.   Drink enough fluids to keep your urine clear or pale yellow (unless you have a medical condition that requires fluid restriction). Increasing fluids may help thin your respiratory secretions (sputum) and reduce chest congestion, and it  will prevent dehydration.   Take medicines only as directed by your health care provider.  If you were prescribed an antibiotic medicine, finish it all even if you start to feel better.  Avoid smoking and secondhand smoke. Exposure to cigarette smoke or irritating chemicals will make bronchitis worse. If you are a smoker, consider using nicotine gum or skin patches to help control withdrawal symptoms. Quitting smoking will help your lungs heal faster.   Reduce the chances of another bout of acute bronchitis by washing your hands frequently, avoiding people with cold symptoms, and trying not to touch your hands to your mouth, nose, or eyes.   Keep all follow-up visits as directed by your health care provider.  SEEK MEDICAL CARE IF: Your symptoms do not improve after 1 week of treatment.  SEEK IMMEDIATE MEDICAL CARE IF:  You develop an increased fever or chills.   You have chest pain.   You have severe shortness of breath.  You have bloody sputum.   You develop dehydration.  You faint or repeatedly feel like you are going to pass out.  You develop repeated vomiting.  You develop a severe headache. MAKE SURE YOU:   Understand these instructions.  Will watch your condition.  Will get help right away if you are not doing well or get worse. Document Released: 01/01/2005 Document Revised: 04/10/2014 Document Reviewed: 05/17/2013 [facility] Patient Information 2015 [place], [place]. This information is not intended to replace advice given to you by your health care provider. Make sure you discuss any questions you have with your health care provider.

## 2015-08-14 NOTE — Progress Notes (Signed)
Pre visit review using our clinic review tool, if applicable. No additional management support is needed unless otherwise documented below in the visit note. 

## 2015-08-15 ENCOUNTER — Ambulatory Visit (HOSPITAL_BASED_OUTPATIENT_CLINIC_OR_DEPARTMENT_OTHER)
Admission: RE | Admit: 2015-08-15 | Discharge: 2015-08-15 | Disposition: A | Discharge status: Home / Self Care | Payer: Federal, State, Local not specified - PPO | Source: Ambulatory Visit | Attending: Family Medicine | Admitting: Family Medicine

## 2015-08-15 DIAGNOSIS — J9811 Atelectasis: Secondary | ICD-10-CM | POA: Insufficient documentation

## 2015-08-15 DIAGNOSIS — R918 Other nonspecific abnormal finding of lung field: Secondary | ICD-10-CM | POA: Diagnosis not present

## 2015-08-15 DIAGNOSIS — R0602 Shortness of breath: Secondary | ICD-10-CM | POA: Insufficient documentation

## 2015-08-24 ENCOUNTER — Ambulatory Visit (INDEPENDENT_AMBULATORY_CARE_PROVIDER_SITE_OTHER): Payer: Federal, State, Local not specified - PPO | Admitting: Psychology

## 2015-08-24 DIAGNOSIS — F4323 Adjustment disorder with mixed anxiety and depressed mood: Secondary | ICD-10-CM | POA: Diagnosis not present

## 2015-08-26 ENCOUNTER — Encounter: Payer: Self-pay | Admitting: Family Medicine

## 2015-08-26 DIAGNOSIS — R05 Cough: Secondary | ICD-10-CM | POA: Insufficient documentation

## 2015-08-26 DIAGNOSIS — R059 Cough, unspecified: Secondary | ICD-10-CM | POA: Insufficient documentation

## 2015-08-26 HISTORY — DX: Cough, unspecified: R05.9

## 2015-08-26 NOTE — Assessment & Plan Note (Signed)
Well controlled, no changes to meds. Encouraged heart healthy diet such as the DASH diet and exercise as tolerated.  °
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
## 2015-08-26 NOTE — Assessment & Plan Note (Addendum)
Acute bronchitis. Will start Doxycycline and Prednisone. Encouraged Mucinex and [employer]

## 2015-09-05 ENCOUNTER — Ambulatory Visit (INDEPENDENT_AMBULATORY_CARE_PROVIDER_SITE_OTHER): Payer: Federal, State, Local not specified - PPO | Admitting: Psychology

## 2015-09-05 ENCOUNTER — Ambulatory Visit (INDEPENDENT_AMBULATORY_CARE_PROVIDER_SITE_OTHER): Payer: Federal, State, Local not specified - PPO | Admitting: Neurology

## 2015-09-05 ENCOUNTER — Encounter: Payer: Self-pay | Admitting: Neurology

## 2015-09-05 VITALS — BP 128/68 | HR 78 | Ht 68.0 in | Wt 250.0 lb

## 2015-09-05 DIAGNOSIS — F4323 Adjustment disorder with mixed anxiety and depressed mood: Secondary | ICD-10-CM | POA: Diagnosis not present

## 2015-09-05 DIAGNOSIS — G4486 Cervicogenic headache: Secondary | ICD-10-CM

## 2015-09-05 DIAGNOSIS — F329 Major depressive disorder, single episode, unspecified: Secondary | ICD-10-CM

## 2015-09-05 DIAGNOSIS — R51 Headache: Secondary | ICD-10-CM | POA: Diagnosis not present

## 2015-09-05 DIAGNOSIS — F32A Depression, unspecified: Secondary | ICD-10-CM

## 2015-09-05 MED ORDER — SERTRALINE HCL 50 MG PO TABS: 150.0000 mg | ORAL_TABLET | Freq: Every day | ORAL | Status: DC
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
## 2015-09-05 NOTE — Patient Instructions (Addendum)
Increase sertraline to 150mg  daily Flexeril as needed for neck pain. As recommended, helpful therapies would be cognitive behavioral, exposure-based, cognitive-processing and/or mindfulness-based psychotherapy Follow up in 5 months.

## 2015-09-19 ENCOUNTER — Ambulatory Visit (INDEPENDENT_AMBULATORY_CARE_PROVIDER_SITE_OTHER): Payer: Federal, State, Local not specified - PPO | Admitting: Medical

## 2015-09-19 ENCOUNTER — Encounter: Payer: Self-pay | Admitting: Medical

## 2015-09-19 ENCOUNTER — Ambulatory Visit (INDEPENDENT_AMBULATORY_CARE_PROVIDER_SITE_OTHER): Payer: Federal, State, Local not specified - PPO | Admitting: Psychology

## 2015-09-19 VITALS — BP 128/80 | HR 100 | Ht 68.0 in | Wt 252.8 lb

## 2015-09-19 DIAGNOSIS — M542 Cervicalgia: Secondary | ICD-10-CM | POA: Diagnosis not present

## 2015-09-19 DIAGNOSIS — F4323 Adjustment disorder with mixed anxiety and depressed mood: Secondary | ICD-10-CM | POA: Diagnosis not present

## 2015-09-19 DIAGNOSIS — R519 Headache, unspecified: Secondary | ICD-10-CM

## 2015-09-19 DIAGNOSIS — M2550 Pain in unspecified joint: Secondary | ICD-10-CM | POA: Diagnosis not present

## 2015-09-19 DIAGNOSIS — R51 Headache: Secondary | ICD-10-CM | POA: Diagnosis not present

## 2015-09-19 LAB — CBC WITH DIFFERENTIAL/PLATELET
BASOS PCT: 0.6 % (ref 0.0–3.0)
Basophils Absolute: 0 10*3/uL (ref 0.0–0.1)
Eosinophils Absolute: 0.1 10*3/uL (ref 0.0–0.7)
Eosinophils Relative: 1.9 % (ref 0.0–5.0)
HEMATOCRIT: 36.3 % (ref 36.0–46.0)
HEMOGLOBIN: 11.7 g/dL — AB (ref 12.0–15.0)
LYMPHS PCT: 30.5 % (ref 12.0–46.0)
Lymphs Abs: 2.2 10*3/uL (ref 0.7–4.0)
MCHC: 32.3 g/dL (ref 30.0–36.0)
MCV: 81.2 fl (ref 78.0–100.0)
MONOS PCT: 7.1 % (ref 3.0–12.0)
Monocytes Absolute: 0.5 10*3/uL (ref 0.1–1.0)
NEUTROS ABS: 4.2 10*3/uL (ref 1.4–7.7)
Neutrophils Relative %: 59.9 % (ref 43.0–77.0)
PLATELETS: 394 10*3/uL (ref 150.0–400.0)
RBC: 4.47 Mil/uL (ref 3.87–5.11)
RDW: 15.3 % (ref 11.5–15.5)
WBC: 7.1 10*3/uL (ref 4.0–10.5)

## 2015-09-19 LAB — C-REACTIVE PROTEIN: CRP: 1.8 mg/dL (ref 0.5–20.0)

## 2015-09-19 LAB — SEDIMENTATION RATE: Sed Rate: 35 mm/hr — ABNORMAL HIGH (ref 0–22)

## 2015-09-19 LAB — RHEUMATOID FACTOR

## 2015-09-19 MED ORDER — TIZANIDINE HCL 6 MG PO CAPS: 6.0000 mg | ORAL_CAPSULE | Freq: Three times a day (TID) | ORAL | Status: DC | PRN

## 2015-09-19 MED ORDER — KETOROLAC TROMETHAMINE 60 MG/2ML IM SOLN
60.0000 mg | Freq: Once | INTRAMUSCULAR | Status: AC
  Administered 2015-09-19: 60 mg via INTRAMUSCULAR
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
## 2015-09-19 NOTE — Progress Notes (Signed)
Pre visit review using our clinic review tool, if applicable. No additional management support is needed unless otherwise documented below in the visit note. 

## 2015-09-19 NOTE — Patient Instructions (Addendum)
For your neck pain we gave toradol 60 mg im today. Ask you not take celebrex tomorrow. Hold flexeril. Rx of zanaflex. Rx advisement given.  Update [place] tomorrow afternoon on level of neck pain.  If you get worse neck pain with increasing ha or other neurologic type symptoms then ED evaluation.  For your arthralgia will get arthtitis panel. Will also include cbc.  May refer back to PT.  Also with recent travel to [place] may consider infectious disease referral if symptoms are just not improving or if worsening.  Neurology referral may also be considered.  Follow up 5 days or as needed.

## 2015-09-20 ENCOUNTER — Telehealth: Payer: Self-pay

## 2015-09-20 DIAGNOSIS — M2550 Pain in unspecified joint: Secondary | ICD-10-CM

## 2015-09-20 LAB — ANTI-NUCLEAR AB-TITER (ANA TITER): ANA Titer 1: NEGATIVE

## 2015-09-20 LAB — ANA: Anti Nuclear Antibody(ANA): POSITIVE — AB

## 2015-09-20 MED ORDER — PREDNISONE 20 MG PO TABS: 20.0000 mg | ORAL_TABLET | Freq: Every day | ORAL | Status: DC

## 2015-09-20 NOTE — Telephone Encounter (Signed)
I sent in prednisone only for 4 days.(stop celebrex when on prednisone). Pt also had ana antibody positive. So I am going to refer her to rheumatolgist as well.

## 2015-09-20 NOTE — Telephone Encounter (Signed)
Please advise how many you would like to send to the pharmacy. It shows 20.

## 2015-09-20 NOTE — Telephone Encounter (Signed)
Pt is agreeable to low dose of prednisone. Pt would like it sent to [facility] at [address]. Please advise. Pt is aware it will be sent to pharmacy.

## 2015-09-20 NOTE — Telephone Encounter (Signed)
Spoke with pt and she voices understanding.  

## 2015-09-27 HISTORY — PX: COLONOSCOPY: SHX174

## 2015-10-03 ENCOUNTER — Ambulatory Visit (INDEPENDENT_AMBULATORY_CARE_PROVIDER_SITE_OTHER): Payer: Federal, State, Local not specified - PPO | Admitting: Psychology

## 2015-10-03 DIAGNOSIS — F4323 Adjustment disorder with mixed anxiety and depressed mood: Secondary | ICD-10-CM

## 2015-10-17 ENCOUNTER — Ambulatory Visit (INDEPENDENT_AMBULATORY_CARE_PROVIDER_SITE_OTHER): Payer: Federal, State, Local not specified - PPO | Admitting: Psychology

## 2015-10-17 DIAGNOSIS — F4323 Adjustment disorder with mixed anxiety and depressed mood: Secondary | ICD-10-CM | POA: Diagnosis not present

## 2015-10-31 ENCOUNTER — Ambulatory Visit (INDEPENDENT_AMBULATORY_CARE_PROVIDER_SITE_OTHER): Payer: Federal, State, Local not specified - PPO | Admitting: Psychology

## 2015-10-31 DIAGNOSIS — F4323 Adjustment disorder with mixed anxiety and depressed mood: Secondary | ICD-10-CM | POA: Diagnosis not present

## 2015-11-12 ENCOUNTER — Ambulatory Visit: Payer: Federal, State, Local not specified - PPO | Admitting: Psychology

## 2015-11-12 ENCOUNTER — Ambulatory Visit (INDEPENDENT_AMBULATORY_CARE_PROVIDER_SITE_OTHER): Payer: Federal, State, Local not specified - PPO | Admitting: Psychology

## 2015-11-12 DIAGNOSIS — F4323 Adjustment disorder with mixed anxiety and depressed mood: Secondary | ICD-10-CM | POA: Diagnosis not present

## 2015-11-26 ENCOUNTER — Ambulatory Visit (INDEPENDENT_AMBULATORY_CARE_PROVIDER_SITE_OTHER): Payer: Federal, State, Local not specified - PPO | Admitting: Psychology

## 2015-11-26 DIAGNOSIS — F4323 Adjustment disorder with mixed anxiety and depressed mood: Secondary | ICD-10-CM

## 2015-12-10 ENCOUNTER — Ambulatory Visit (INDEPENDENT_AMBULATORY_CARE_PROVIDER_SITE_OTHER): Payer: Federal, State, Local not specified - PPO | Admitting: Psychology

## 2015-12-10 DIAGNOSIS — F4323 Adjustment disorder with mixed anxiety and depressed mood: Secondary | ICD-10-CM

## 2015-12-18 ENCOUNTER — Telehealth: Payer: Self-pay | Admitting: Family Medicine

## 2015-12-18 NOTE — Telephone Encounter (Signed)
Please put in note and change PCP

## 2015-12-18 NOTE — Telephone Encounter (Signed)
FYI - pt will be going back to Dr. Coralyn Mark  Message    Appointment canceled for [facility] ([id number])   Visit Type: NEW PATIENT 30   Date    Time   Length  Provider         Department   12/25/2015  8:30 AM 30 mins. Mosie Lukes, MD    [facility]      Reason for Cancellation: Canceled via MyChart      Patient Comments: Dr. Chilton Si has resumed her practice. I hope that I will be able to continue to see Dr. Charlett Blake on an urgent basis.

## 2015-12-18 NOTE — Telephone Encounter (Signed)
Documented and PCP changed already

## 2015-12-25 ENCOUNTER — Ambulatory Visit: Payer: Self-pay | Admitting: Family Medicine

## 2015-12-26 ENCOUNTER — Ambulatory Visit (INDEPENDENT_AMBULATORY_CARE_PROVIDER_SITE_OTHER): Payer: Federal, State, Local not specified - PPO | Admitting: Psychology

## 2015-12-26 DIAGNOSIS — F4323 Adjustment disorder with mixed anxiety and depressed mood: Secondary | ICD-10-CM

## 2016-01-09 ENCOUNTER — Ambulatory Visit (INDEPENDENT_AMBULATORY_CARE_PROVIDER_SITE_OTHER): Payer: Federal, State, Local not specified - PPO | Admitting: Psychology

## 2016-01-09 DIAGNOSIS — F4323 Adjustment disorder with mixed anxiety and depressed mood: Secondary | ICD-10-CM

## 2016-01-23 ENCOUNTER — Ambulatory Visit (INDEPENDENT_AMBULATORY_CARE_PROVIDER_SITE_OTHER): Payer: Federal, State, Local not specified - PPO | Admitting: Psychology

## 2016-01-23 DIAGNOSIS — F4323 Adjustment disorder with mixed anxiety and depressed mood: Secondary | ICD-10-CM

## 2016-02-05 ENCOUNTER — Ambulatory Visit (INDEPENDENT_AMBULATORY_CARE_PROVIDER_SITE_OTHER): Payer: Federal, State, Local not specified - PPO | Admitting: Neurology

## 2016-02-05 ENCOUNTER — Encounter: Payer: Self-pay | Admitting: Neurology

## 2016-02-05 VITALS — BP 124/80 | HR 76 | Ht 68.0 in | Wt 254.0 lb

## 2016-02-05 DIAGNOSIS — R51 Headache: Secondary | ICD-10-CM | POA: Diagnosis not present

## 2016-02-05 DIAGNOSIS — G4486 Cervicogenic headache: Secondary | ICD-10-CM

## 2016-02-05 MED ORDER — TIZANIDINE HCL 6 MG PO CAPS: 6.0000 mg | ORAL_CAPSULE | Freq: Three times a day (TID) | ORAL | Status: DC | PRN

## 2016-02-05 NOTE — Patient Instructions (Signed)
Continue sertraline 150mg  daily Instead of cyclobenzaprine, retry tizanidine Follow up in one year
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
## 2016-02-11 ENCOUNTER — Ambulatory Visit: Payer: Federal, State, Local not specified - PPO | Admitting: Psychology

## 2016-02-14 DIAGNOSIS — D571 Sickle-cell disease without crisis: Secondary | ICD-10-CM | POA: Insufficient documentation

## 2016-02-14 DIAGNOSIS — Z8619 Personal history of other infectious and parasitic diseases: Secondary | ICD-10-CM

## 2016-02-14 DIAGNOSIS — D573 Sickle-cell trait: Secondary | ICD-10-CM

## 2016-02-14 DIAGNOSIS — H9201 Otalgia, right ear: Secondary | ICD-10-CM | POA: Insufficient documentation

## 2016-02-14 HISTORY — DX: Personal history of other infectious and parasitic diseases: Z86.19

## 2016-02-14 HISTORY — DX: Sickle-cell trait: D57.3

## 2016-02-14 HISTORY — DX: Otalgia, right ear: H92.01

## 2016-02-25 ENCOUNTER — Ambulatory Visit (INDEPENDENT_AMBULATORY_CARE_PROVIDER_SITE_OTHER): Payer: Federal, State, Local not specified - PPO | Admitting: Psychology

## 2016-02-25 DIAGNOSIS — F4323 Adjustment disorder with mixed anxiety and depressed mood: Secondary | ICD-10-CM

## 2016-03-19 ENCOUNTER — Ambulatory Visit (INDEPENDENT_AMBULATORY_CARE_PROVIDER_SITE_OTHER): Payer: Federal, State, Local not specified - PPO | Admitting: Psychology

## 2016-03-19 DIAGNOSIS — F4323 Adjustment disorder with mixed anxiety and depressed mood: Secondary | ICD-10-CM | POA: Diagnosis not present

## 2016-03-24 ENCOUNTER — Other Ambulatory Visit: Payer: Self-pay | Admitting: Internal Medicine

## 2016-03-24 ENCOUNTER — Other Ambulatory Visit (HOSPITAL_COMMUNITY)
Admission: RE | Admit: 2016-03-24 | Discharge: 2016-03-24 | Disposition: A | Discharge status: Home / Self Care | Payer: Federal, State, Local not specified - PPO | Source: Ambulatory Visit | Attending: Internal Medicine | Admitting: Internal Medicine

## 2016-03-24 DIAGNOSIS — Z1151 Encounter for screening for human papillomavirus (HPV): Secondary | ICD-10-CM | POA: Diagnosis present

## 2016-03-24 DIAGNOSIS — Z01419 Encounter for gynecological examination (general) (routine) without abnormal findings: Secondary | ICD-10-CM | POA: Insufficient documentation

## 2016-03-27 LAB — CYTOLOGY - PAP

## 2016-03-31 ENCOUNTER — Other Ambulatory Visit: Payer: Self-pay | Admitting: Internal Medicine

## 2016-03-31 DIAGNOSIS — Z1231 Encounter for screening mammogram for malignant neoplasm of breast: Secondary | ICD-10-CM

## 2016-04-07 ENCOUNTER — Other Ambulatory Visit: Payer: Self-pay | Admitting: Internal Medicine

## 2016-04-07 ENCOUNTER — Other Ambulatory Visit (HOSPITAL_COMMUNITY): Payer: Self-pay | Admitting: Surgery

## 2016-04-07 ENCOUNTER — Other Ambulatory Visit (HOSPITAL_BASED_OUTPATIENT_CLINIC_OR_DEPARTMENT_OTHER): Payer: Self-pay | Admitting: Internal Medicine

## 2016-04-07 ENCOUNTER — Ambulatory Visit (HOSPITAL_BASED_OUTPATIENT_CLINIC_OR_DEPARTMENT_OTHER)
Admission: RE | Admit: 2016-04-07 | Discharge: 2016-04-07 | Disposition: A | Discharge status: Home / Self Care | Payer: Federal, State, Local not specified - PPO | Source: Ambulatory Visit | Attending: Internal Medicine | Admitting: Internal Medicine

## 2016-04-07 DIAGNOSIS — Z1231 Encounter for screening mammogram for malignant neoplasm of breast: Secondary | ICD-10-CM | POA: Diagnosis present

## 2016-04-09 ENCOUNTER — Ambulatory Visit (INDEPENDENT_AMBULATORY_CARE_PROVIDER_SITE_OTHER): Payer: Federal, State, Local not specified - PPO | Admitting: Psychology

## 2016-04-09 DIAGNOSIS — F4323 Adjustment disorder with mixed anxiety and depressed mood: Secondary | ICD-10-CM

## 2016-04-11 ENCOUNTER — Ambulatory Visit: Payer: Self-pay
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
## 2016-04-14 ENCOUNTER — Encounter: Payer: Self-pay | Admitting: Cardiovascular Disease

## 2016-04-14 ENCOUNTER — Ambulatory Visit (HOSPITAL_COMMUNITY)
Admission: RE | Admit: 2016-04-14 | Discharge: 2016-04-14 | Disposition: A | Discharge status: Home / Self Care | Payer: Federal, State, Local not specified - PPO | Source: Ambulatory Visit | Attending: Surgery | Admitting: Surgery

## 2016-04-14 ENCOUNTER — Ambulatory Visit (INDEPENDENT_AMBULATORY_CARE_PROVIDER_SITE_OTHER): Payer: Federal, State, Local not specified - PPO | Admitting: Cardiovascular Disease

## 2016-04-14 ENCOUNTER — Other Ambulatory Visit: Payer: Self-pay

## 2016-04-14 VITALS — BP 125/85 | HR 68 | Ht 67.0 in | Wt 257.0 lb

## 2016-04-14 DIAGNOSIS — R918 Other nonspecific abnormal finding of lung field: Secondary | ICD-10-CM | POA: Diagnosis not present

## 2016-04-14 DIAGNOSIS — I10 Essential (primary) hypertension: Secondary | ICD-10-CM | POA: Diagnosis not present

## 2016-04-14 NOTE — Patient Instructions (Signed)
Medication Instructions:  Your physician recommends that you continue on your current medications as directed. Please refer to the Current Medication list given to you today.  Labwork: NONE  Testing/Procedures: NONE  Follow-Up: Your physician wants you to follow-up in: 1 year with Dr. Johnsie Cancel.You will receive a reminder letter in the mail two months in advance. If you don't receive a letter, please call our office to schedule the follow-up appointment.  Your physician recommends that you schedule a follow-up appointment with [facility].     Any Other Special Instructions Will Be Listed Below (If Applicable).     If you need a refill on your cardiac medications before your next appointment, please call your pharmacy.

## 2016-04-15 ENCOUNTER — Ambulatory Visit (INDEPENDENT_AMBULATORY_CARE_PROVIDER_SITE_OTHER): Payer: Federal, State, Local not specified - PPO | Admitting: Pharmacist

## 2016-04-15 DIAGNOSIS — E785 Hyperlipidemia, unspecified: Secondary | ICD-10-CM

## 2016-04-15 MED ORDER — ROSUVASTATIN CALCIUM 10 MG PO TABS: 10.0000 mg | ORAL_TABLET | Freq: Every day | ORAL | Status: DC

## 2016-04-15 NOTE — Patient Instructions (Signed)
Start taking rosuvastatin 10mg  once daily. If you start to have muscle pain again, stop taking until pain resolves then take one tablet three times WEEKLY.   If you continue having problems on the three times weekly dose or severe pain, please contact the lipid clinic. [phone].  Return in 3 months for lab work and follow up appointment.

## 2016-04-15 NOTE — Progress Notes (Signed)
Patient ID: Judy [name]                 DOB: 1950-12-18                    MRN: [id number]     HPI: Judy [name] is a 65 y.o. female patient referred to lipid clinic by Dr. Johnsie Cancel. Previously on atorvastatin 10mg . Reports using for one month then stopping due to severe muscle pain which resolved upon discontinuation. Reports discontinuing approx 1 wk ago. States healthy dietary choices difficult due to frequent travel for work.   Current Medications: None  Intolerances: Atorvastatin 10mg   Risk Factors: LDL >190 mg/dL, HTN  LDL goal: <130 mg/dL  Diet: Travels often for work. Generally eats what is available or at restaurants while traveling. Cooks when at home. Does not fry foods and admits she does not eat many vegetables.  Exercise: Minimal due to pain  Family History: HTN in mother, DM in maternal grandmother  Social History:  Sex education speaker. Never smoker. Denies smokeless tobacco, EtOH, or illicit drugs.  Lipid Panel 4/17: TC 286, TG 131, HDL 53, LDL 208  Past Medical History  Diagnosis Date  . Dysplasia of cervix   . Hyperlipidemia   . Obesity   . Pulmonary embolism ([place])   . OSA (obstructive sleep apnea)   . Hypertension   . GERD (gastroesophageal reflux disease)   . Arthritis   . DDD (degenerative disc disease)   . Back pain   . Sleep apnea     does have a cpap  . Shortness of breath   . Depression     Current Outpatient Prescriptions on File Prior to Visit  Medication Sig Dispense Refill  . albuterol (PROVENTIL HFA;VENTOLIN HFA) 108 (90 BASE) MCG/ACT inhaler Inhale 2 puffs into the lungs every 6 (six) hours as needed for wheezing or shortness of breath. 1 Inhaler 0  . candesartan (ATACAND) 16 MG tablet Take 1 tablet (16 mg total) by mouth daily. 90 tablet 2  . celecoxib (CELEBREX) 200 MG capsule Take 200 mg by mouth 2 (two) times daily.    . Cholecalciferol (VITAMIN D) 1000 UNITS capsule Take 1,000 Units by mouth daily.    . eszopiclone (LUNESTA) 2 MG  TABS tablet Take 1 tablet (2 mg total) by mouth at bedtime as needed for sleep. Take immediately before bedtime 12 tablet 2  . hydrochlorothiazide (HYDRODIURIL) 12.5 MG tablet Take 1 tablet (12.5 mg total) by mouth daily. 90 tablet 1  . lansoprazole (PREVACID) 30 MG capsule Take 1 capsule (30 mg total) by mouth daily at 12 noon. 90 capsule 1  . nystatin-triamcinolone (MYCOLOG II) cream Apply 1 application topically 2 (two) times daily.  1  . sertraline (ZOLOFT) 50 MG tablet Take 3 tablets (150 mg total) by mouth daily. 90 tablet 5  . tizanidine (ZANAFLEX) 6 MG capsule Take 1 capsule (6 mg total) by mouth 3 (three) times daily as needed for muscle spasms. 30 capsule 5   No current facility-administered medications on file prior to visit.    Allergies  Allergen Reactions  . Contrast Media [Iodinated Diagnostic Agents] Shortness Of Breath    Swelling mouth  . Ioxaglate Shortness Of Breath    Swelling mouth  . Ivp Dye [Iodinated Diagnostic Agents] Swelling    Assessment/Plan:  1.) Hyperlipidemia:  Current LDL 208 mg/dL above goal of <130 mg/dL. Discussed initiation of rosuvastatin 10mg  daily with pt. If muscle pain returns, she is to  stop medication until pain resolves and try taking 3x weekly. She is agreeable to this plan in lieu of atorvastatin intolerance. Reviewed healthier choices that can be made in regard to dining out. Agrees to incorporate more fiber and leafy greens into diet. Follow up in 3 months for lipid panel/LFTs and clinic visit.  Stephens November, PharmD Clinical Pharmacy Resident 4:59 PM, 04/15/2016

## 2016-04-18 ENCOUNTER — Encounter: Payer: Federal, State, Local not specified - PPO | Attending: Surgery | Admitting: Dietician

## 2016-04-18 ENCOUNTER — Encounter: Payer: Self-pay | Admitting: Dietician

## 2016-04-18 DIAGNOSIS — Z01818 Encounter for other preprocedural examination: Secondary | ICD-10-CM | POA: Insufficient documentation

## 2016-04-18 NOTE — Progress Notes (Signed)
  Pre-Op Assessment Visit:  Pre-Operative Sleeve gastrectomy Surgery  Medical Nutrition Therapy:  Appt start time: 0820   End time:  0905  Patient was seen on 04/18/2016 for Pre-Operative Nutrition Assessment. Assessment and letter of approval faxed to [facility] Surgery Bariatric Surgery Program coordinator on 04/18/2016.   Preferred Learning Style:   No preference indicated   Learning Readiness:   Ready  Handouts given during visit include:  Pre-Op Goals Bariatric Surgery Protein Shakes   During the appointment today the following Pre-Op Goals were reviewed with the patient: Maintain or lose weight as instructed by your surgeon Make healthy food choices Begin to limit portion sizes Limited concentrated sugars and fried foods Keep fat/sugar in the single digits per serving on   food labels Practice CHEWING your food  (aim for 30 chews per bite or until applesauce consistency) Practice not drinking 15 minutes before, during, and 30 minutes after each meal/snack Avoid all carbonated beverages  Avoid/limit caffeinated beverages  Avoid all sugar-sweetened beverages Consume 3 meals per day; eat every 3-5 hours Make a list of non-food related activities Aim for 64-100 ounces of FLUID daily  Aim for at least 60-80 grams of PROTEIN daily Look for a liquid protein source that contain ?15 g protein and ?5 g carbohydrate  (ex: shakes, drinks, shots)  Demonstrated degree of understanding via:  Teach Back  Teaching Method Utilized:  Visual Auditory Hands on  Barriers to learning/adherence to lifestyle change: none  Patient to call the [facility] to enroll in Pre-Op and Post-Op Nutrition Education when surgery date is scheduled.

## 2016-04-24 ENCOUNTER — Ambulatory Visit (INDEPENDENT_AMBULATORY_CARE_PROVIDER_SITE_OTHER): Payer: Federal, State, Local not specified - PPO | Admitting: Psychiatry

## 2016-04-27 ENCOUNTER — Other Ambulatory Visit: Payer: Self-pay | Admitting: Neurology

## 2016-04-28 ENCOUNTER — Ambulatory Visit (INDEPENDENT_AMBULATORY_CARE_PROVIDER_SITE_OTHER): Payer: Federal, State, Local not specified - PPO | Admitting: Physician Assistant

## 2016-04-28 ENCOUNTER — Encounter: Payer: Self-pay | Admitting: Physician Assistant

## 2016-04-28 VITALS — BP 130/84 | HR 93 | Temp 98.3°F | Ht 67.0 in | Wt 256.2 lb

## 2016-04-28 DIAGNOSIS — M436 Torticollis: Secondary | ICD-10-CM | POA: Diagnosis not present

## 2016-04-28 MED ORDER — KETOROLAC TROMETHAMINE 60 MG/2ML IM SOLN
60.0000 mg | Freq: Once | INTRAMUSCULAR | Status: AC
  Administered 2016-04-28: 60 mg via INTRAMUSCULAR

## 2016-04-28 MED ORDER — HYDROCODONE-ACETAMINOPHEN 10-325 MG PO TABS: 1.0000 | ORAL_TABLET | Freq: Three times a day (TID) | ORAL | Status: DC | PRN

## 2016-04-28 MED ORDER — CYCLOBENZAPRINE HCL 10 MG PO TABS: 10.0000 mg | ORAL_TABLET | Freq: Three times a day (TID) | ORAL | Status: DC | PRN
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
## 2016-04-28 NOTE — Telephone Encounter (Signed)
PLAN: Zoloft 150mg  daily  Last OV: 02/05/16 Next OV: 02/04/17

## 2016-04-28 NOTE — Patient Instructions (Addendum)
Please restart Celebrex tomorrow. Take once daily.  Take Norco as directed as needed for breakthrough pain. Stop the Tizanidine and begin Flexeril up to three times daily.  Follow-up if symptoms are not resolving.

## 2016-04-28 NOTE — Progress Notes (Signed)
Pre visit review using our clinic review tool, if applicable. No additional management support is needed unless otherwise documented below in the visit note. 

## 2016-05-02 ENCOUNTER — Ambulatory Visit (INDEPENDENT_AMBULATORY_CARE_PROVIDER_SITE_OTHER): Payer: Federal, State, Local not specified - PPO | Admitting: Psychology

## 2016-05-02 DIAGNOSIS — F4323 Adjustment disorder with mixed anxiety and depressed mood: Secondary | ICD-10-CM | POA: Diagnosis not present

## 2016-05-06 ENCOUNTER — Ambulatory Visit: Payer: Self-pay | Admitting: Dietician

## 2016-05-08 ENCOUNTER — Encounter: Payer: Self-pay | Admitting: Dietician

## 2016-05-08 ENCOUNTER — Encounter: Payer: Federal, State, Local not specified - PPO | Attending: Surgery | Admitting: Dietician

## 2016-05-08 ENCOUNTER — Ambulatory Visit (INDEPENDENT_AMBULATORY_CARE_PROVIDER_SITE_OTHER): Payer: Federal, State, Local not specified - PPO | Admitting: Psychiatry

## 2016-05-08 DIAGNOSIS — Z01818 Encounter for other preprocedural examination: Secondary | ICD-10-CM | POA: Diagnosis present

## 2016-05-08 NOTE — Progress Notes (Signed)
Supervised Weight Loss:  Appt start time: 0930 end time:  0945.  SWL:  Primary concerns today: Judy [name] returns for a follow up nutrition visit in preparation for sleeve gastrectomy. She reports she is feeling ready for surgery. Has ordered some bariatric-approved protein powder (Bariatric Advantage and Unjury). They have not arrived yet but she plans to try them before surgery. She reports she has been working on avoiding late night sugar cravings. Judy [name] has also been practicing chewing foods thoroughly and feels like this is becoming more of a habit. Has always tended to avoid drinking while eating and feels like this will not be difficult for her.    Weight: 258.6 lbs  MEDICATIONS: see list  DIETARY INTAKE: Did not assess today  Recent physical activity: did not assess today  Estimated energy needs: 1400-1600 calories  Progress Towards Goal(s):  In progress.   Nutritional Diagnosis:  -3.3 Overweight/obesity related to past poor dietary habits and physical inactivity as evidenced by patient in SWL for pending bariatric surgery following dietary guidelines for continued weight loss.     Intervention:  Nutrition counseling provided. Reviewed pre op and post op diet recommendations. Addressed patient's nutrition-related questions pertaining to bariatric surgery. Encouraged patient to continue practicing pre op goals and prepare for pre op diet 2 weeks prior to surgery.   Handouts given during visit include:  Pre op diet  Monitoring/Evaluation:  Patient to attend pre op class prior to bariatric surgery.

## 2016-05-12 DIAGNOSIS — Z01818 Encounter for other preprocedural examination: Secondary | ICD-10-CM | POA: Diagnosis not present

## 2016-05-12 NOTE — Progress Notes (Signed)
  Pre-Operative Nutrition Class:  Appt start time: 830   End time:  930.  Patient was seen on 05/12/2016 for Pre-Operative Bariatric Surgery Education [facility].   Surgery date:  Surgery type: sleeve gastrectomy Start weight at [facility]: 256.5 lbs on 04/18/2016 Weight today: 257 lbs  TANITA  BODY COMP RESULTS  05/12/16   BMI (kg/m^2) 40.3   Fat Mass (lbs) 139.2   Fat Free Mass (lbs) 117.8   Total Body Water (lbs) 86.2   Samples given per MNT protocol. Patient educated on appropriate usage: Premier protein shake (strawberry - qty 1) Lot #: [id number] Exp: 04/2017  Celebrate Vitamins Multivitamin (orange - qty 1) Lot #: [id number] Exp: 11/2016  Bariatric Advantage Calcium Citrate chew (caramel - qty 1) Lot #: [id number] Exp: 10/2016  Unjury Protein Powder (vanilla - qty 1) Lot #: [id number] Exp: 08/2016  The following the learning objectives were met by the patient during this course:  Identify Pre-Op Dietary Goals and will begin 2 weeks pre-operatively  Identify appropriate sources of fluids and proteins   State protein recommendations and appropriate sources pre and post-operatively  Identify Post-Operative Dietary Goals and will follow for 2 weeks post-operatively  Identify appropriate multivitamin and calcium sources  Describe the need for physical activity post-operatively and will follow MD recommendations  State when to call healthcare provider regarding medication questions or post-operative complications  Handouts given during class include:  Pre-Op Bariatric Surgery Diet Handout  Protein Shake Handout  Post-Op Bariatric Surgery Nutrition Handout  BELT Program Information Flyer  Support Group Information Flyer  [facility] Bariatric Supplements Price List  Follow-Up Plan: Patient will follow-up at [facility] 2 weeks post operatively for diet advancement per MD.

## 2016-05-20 ENCOUNTER — Ambulatory Visit (INDEPENDENT_AMBULATORY_CARE_PROVIDER_SITE_OTHER): Payer: Federal, State, Local not specified - PPO | Admitting: Psychology

## 2016-05-20 DIAGNOSIS — F4323 Adjustment disorder with mixed anxiety and depressed mood: Secondary | ICD-10-CM | POA: Diagnosis not present

## 2016-05-26 ENCOUNTER — Ambulatory Visit (INDEPENDENT_AMBULATORY_CARE_PROVIDER_SITE_OTHER): Payer: Federal, State, Local not specified - PPO | Admitting: Psychology

## 2016-05-26 DIAGNOSIS — F4323 Adjustment disorder with mixed anxiety and depressed mood: Secondary | ICD-10-CM

## 2016-06-09 ENCOUNTER — Encounter: Payer: Federal, State, Local not specified - PPO | Attending: Surgery | Admitting: Dietician

## 2016-06-09 ENCOUNTER — Encounter: Payer: Self-pay | Admitting: Dietician

## 2016-06-09 ENCOUNTER — Ambulatory Visit (INDEPENDENT_AMBULATORY_CARE_PROVIDER_SITE_OTHER): Payer: Federal, State, Local not specified - PPO | Admitting: Psychology

## 2016-06-09 DIAGNOSIS — F4323 Adjustment disorder with mixed anxiety and depressed mood: Secondary | ICD-10-CM

## 2016-06-09 DIAGNOSIS — Z01818 Encounter for other preprocedural examination: Secondary | ICD-10-CM | POA: Diagnosis present
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
## 2016-06-20 ENCOUNTER — Ambulatory Visit (INDEPENDENT_AMBULATORY_CARE_PROVIDER_SITE_OTHER): Payer: Federal, State, Local not specified - PPO | Admitting: Psychology

## 2016-06-20 DIAGNOSIS — F4323 Adjustment disorder with mixed anxiety and depressed mood: Secondary | ICD-10-CM | POA: Diagnosis not present

## 2016-06-27 ENCOUNTER — Telehealth: Payer: Self-pay | Admitting: Cardiovascular Disease

## 2016-06-27 NOTE — Telephone Encounter (Signed)
Called patient back. Patient is not sure who ordered her last sleep study. Looking in patient's chart, Dr Elsworth Soho was on order. Patient stated she was going to get in touch with Dr. Bari Mantis office or her PCP. Informed patient that we do have a sleep study doctor at our office, but she would need a referral. Patient verbalized understanding.

## 2016-06-27 NOTE — Telephone Encounter (Signed)
New message     The pt is is staying tied and the pt feeling it is because of her sleep apnea, the pt feels she needs another sleep study

## 2016-07-04 ENCOUNTER — Ambulatory Visit (INDEPENDENT_AMBULATORY_CARE_PROVIDER_SITE_OTHER): Payer: Federal, State, Local not specified - PPO | Admitting: Psychology

## 2016-07-04 DIAGNOSIS — F4323 Adjustment disorder with mixed anxiety and depressed mood: Secondary | ICD-10-CM

## 2016-07-08 DIAGNOSIS — M75100 Unspecified rotator cuff tear or rupture of unspecified shoulder, not specified as traumatic: Secondary | ICD-10-CM | POA: Insufficient documentation

## 2016-07-08 HISTORY — DX: Unspecified rotator cuff tear or rupture of unspecified shoulder, not specified as traumatic: M75.100

## 2016-07-18 ENCOUNTER — Ambulatory Visit (INDEPENDENT_AMBULATORY_CARE_PROVIDER_SITE_OTHER): Payer: Federal, State, Local not specified - PPO | Admitting: Psychology

## 2016-07-18 DIAGNOSIS — F4323 Adjustment disorder with mixed anxiety and depressed mood: Secondary | ICD-10-CM

## 2016-07-21 ENCOUNTER — Encounter (HOSPITAL_COMMUNITY): Payer: Self-pay

## 2016-07-21 ENCOUNTER — Ambulatory Visit: Payer: Self-pay | Admitting: Surgery
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
## 2016-07-22 ENCOUNTER — Other Ambulatory Visit: Payer: Self-pay

## 2016-07-23 ENCOUNTER — Ambulatory Visit: Payer: Self-pay

## 2016-07-23 NOTE — Patient Instructions (Addendum)
Judy [name]  07/23/2016   Your procedure is scheduled on: 08/05/16  Report to [facility] Main  Entrance take [facility]  elevators to 3rd floor to  [place] at 1010  AM.  Call this number if you have problems the morning of surgery [phone]   Remember: ONLY 1 PERSON MAY GO WITH YOU TO SHORT STAY TO GET  READY MORNING OF [place].  Do not eat food or drink liquids :After Midnight.  IF YOU HAVE INSTRUCTIONS FOR BOWEL PREP PER OFFICE FOLLOW THAT-- CHECK ALL PAPERWORK   Take these medicines the morning of surgery with A SIP OF WATER: Prevacid, Sertaline May take Flexaril, Zanaflex if needed/ use albuterol if needed DO NOT TAKE ANY DIABETIC MEDICATIONS DAY OF YOUR SURGERY                               You may not have any metal on your body including hair pins and              piercings  Do not wear jewelry, make-up, lotions, powders or perfumes, deodorant             Do not wear nail polish.  Do not shave  48 hours prior to surgery.              Men may shave face and neck.   Do not bring valuables to the hospital. [place].  Contacts, dentures or bridgework may not be worn into surgery.  Leave suitcase in the car. After surgery it may be brought to your room.            [facility] - Preparing for Surgery Before surgery, you can play an important role.  Because skin is not sterile, your skin needs to be as free of germs as possible.  You can reduce the number of germs on your skin by washing with CHG (chlorahexidine gluconate) soap before surgery.  CHG is an antiseptic cleaner which kills germs and bonds with the skin to continue killing germs even after washing. Please DO NOT use if you have an allergy to CHG or antibacterial soaps.  If your skin becomes reddened/irritated stop using the CHG and inform your nurse when you arrive at Short Stay. Do not shave (including legs and underarms) for at least 48  hours prior to the first CHG shower.  You may shave your face/neck. Please follow these instructions carefully:  1.  Shower with CHG Soap the night before surgery and the  morning of Surgery.  2.  If you choose to wash your hair, wash your hair first as usual with your  normal  shampoo.  3.  After you shampoo, rinse your hair and body thoroughly to remove the  shampoo.                           4.  Use CHG as you would any other liquid soap.  You can apply chg directly  to the skin and wash                       Gently with a scrungie or clean washcloth.  5.  Apply the CHG Soap to your body ONLY FROM THE NECK DOWN.   Do not use on face/ open                           Wound or open sores. Avoid contact with eyes, ears mouth and genitals (private parts).                       Wash face,  Genitals (private parts) with your normal soap.             6.  Wash thoroughly, paying special attention to the area where your surgery  will be performed.  7.  Thoroughly rinse your body with warm water from the neck down.  8.  DO NOT shower/wash with your normal soap after using and rinsing off  the CHG Soap.                9.  Pat yourself dry with a clean towel.            10.  Wear clean pajamas.            11.  Place clean sheets on your bed the night of your first shower and do not  sleep with pets. Day of Surgery : Do not apply any lotions/deodorants the morning of surgery.  Please wear clean clothes to the hospital/surgery center.  FAILURE TO FOLLOW THESE INSTRUCTIONS MAY RESULT IN THE CANCELLATION OF YOUR SURGERY PATIENT SIGNATURE_________________________________  NURSE SIGNATURE__________________________________  ________________________________________________________________________

## 2016-07-25 ENCOUNTER — Encounter (HOSPITAL_COMMUNITY)
Admission: RE | Admit: 2016-07-25 | Discharge: 2016-07-25 | Disposition: A | Discharge status: Home / Self Care | Payer: Federal, State, Local not specified - PPO | Source: Ambulatory Visit | Attending: Surgery | Admitting: Surgery

## 2016-07-25 ENCOUNTER — Encounter (HOSPITAL_COMMUNITY): Payer: Self-pay

## 2016-07-25 ENCOUNTER — Ambulatory Visit (INDEPENDENT_AMBULATORY_CARE_PROVIDER_SITE_OTHER): Payer: Federal, State, Local not specified - PPO | Admitting: Psychology

## 2016-07-25 DIAGNOSIS — Z6841 Body Mass Index (BMI) 40.0 and over, adult: Secondary | ICD-10-CM | POA: Insufficient documentation

## 2016-07-25 DIAGNOSIS — F4323 Adjustment disorder with mixed anxiety and depressed mood: Secondary | ICD-10-CM

## 2016-07-25 DIAGNOSIS — Z01812 Encounter for preprocedural laboratory examination: Secondary | ICD-10-CM | POA: Diagnosis not present

## 2016-07-25 HISTORY — DX: Personal history of other medical treatment: Z92.89

## 2016-07-25 HISTORY — DX: Anemia, unspecified: D64.9

## 2016-07-25 HISTORY — DX: Unspecified rotator cuff tear or rupture of unspecified shoulder, not specified as traumatic: M75.100

## 2016-07-25 LAB — COMPREHENSIVE METABOLIC PANEL
ALT: 15 U/L (ref 14–54)
ANION GAP: 6 (ref 5–15)
AST: 22 U/L (ref 15–41)
Albumin: 4.2 g/dL (ref 3.5–5.0)
Alkaline Phosphatase: 88 U/L (ref 38–126)
BILIRUBIN TOTAL: 0.7 mg/dL (ref 0.3–1.2)
BUN: 18 mg/dL (ref 6–20)
CHLORIDE: 104 mmol/L (ref 101–111)
CO2: 29 mmol/L (ref 22–32)
Calcium: 9.4 mg/dL (ref 8.9–10.3)
Creatinine, Ser: 0.58 mg/dL (ref 0.44–1.00)
Glucose, Bld: 105 mg/dL — ABNORMAL HIGH (ref 65–99)
POTASSIUM: 3.9 mmol/L (ref 3.5–5.1)
Sodium: 139 mmol/L (ref 135–145)
TOTAL PROTEIN: 7.9 g/dL (ref 6.5–8.1)

## 2016-07-25 LAB — CBC WITH DIFFERENTIAL/PLATELET
Basophils Absolute: 0 K/uL (ref 0.0–0.1)
Basophils Relative: 0 %
Eosinophils Absolute: 0.1 K/uL (ref 0.0–0.7)
Eosinophils Relative: 2 %
HCT: 37.2 % (ref 36.0–46.0)
Hemoglobin: 11.8 g/dL — ABNORMAL LOW (ref 12.0–15.0)
Lymphocytes Relative: 28 %
Lymphs Abs: 1.9 K/uL (ref 0.7–4.0)
MCH: 25.4 pg — ABNORMAL LOW (ref 26.0–34.0)
MCHC: 31.7 g/dL (ref 30.0–36.0)
MCV: 80 fL (ref 78.0–100.0)
Monocytes Absolute: 0.5 K/uL (ref 0.1–1.0)
Monocytes Relative: 7 %
Neutro Abs: 4.4 K/uL (ref 1.7–7.7)
Neutrophils Relative %: 63 %
Platelets: 364 K/uL (ref 150–400)
RBC: 4.65 MIL/uL (ref 3.87–5.11)
RDW: 14.5 % (ref 11.5–15.5)
WBC: 6.9 K/uL (ref 4.0–10.5)

## 2016-07-25 NOTE — Progress Notes (Signed)
ekg with ov Dr Johnsie Cancel 5/17, chest 5/17, eccho 1/16   epic

## 2016-07-25 NOTE — Progress Notes (Signed)
Discussed CPAP at PAT VISIT--to bring mask and tubing with her to hospital

## 2016-07-31 NOTE — Progress Notes (Signed)
Patient made aware surgery time on 08/05/2016 from 0945am-1215pm.  Patient to arrive in Short Stay at 0745am.

## 2016-08-05 ENCOUNTER — Encounter (HOSPITAL_COMMUNITY)
Admission: RE | Disposition: A | Discharge status: Home / Self Care | Payer: Self-pay | Source: Ambulatory Visit | Attending: Surgery

## 2016-08-05 ENCOUNTER — Inpatient Hospital Stay (HOSPITAL_COMMUNITY): Payer: Federal, State, Local not specified - PPO | Admitting: Anesthesiology

## 2016-08-05 ENCOUNTER — Encounter (HOSPITAL_COMMUNITY): Payer: Self-pay

## 2016-08-05 ENCOUNTER — Inpatient Hospital Stay (HOSPITAL_COMMUNITY)
Admission: RE | Admit: 2016-08-05 | Discharge: 2016-08-08 | DRG: 621 | Disposition: A | Discharge status: Home / Self Care | Payer: Federal, State, Local not specified - PPO | Source: Ambulatory Visit | Attending: Surgery | Admitting: Surgery

## 2016-08-05 DIAGNOSIS — I10 Essential (primary) hypertension: Secondary | ICD-10-CM | POA: Diagnosis present

## 2016-08-05 DIAGNOSIS — K449 Diaphragmatic hernia without obstruction or gangrene: Secondary | ICD-10-CM | POA: Diagnosis present

## 2016-08-05 DIAGNOSIS — E785 Hyperlipidemia, unspecified: Secondary | ICD-10-CM | POA: Diagnosis present

## 2016-08-05 DIAGNOSIS — R05 Cough: Secondary | ICD-10-CM | POA: Diagnosis not present

## 2016-08-05 DIAGNOSIS — Z79899 Other long term (current) drug therapy: Secondary | ICD-10-CM

## 2016-08-05 DIAGNOSIS — M2550 Pain in unspecified joint: Secondary | ICD-10-CM | POA: Diagnosis present

## 2016-08-05 DIAGNOSIS — K219 Gastro-esophageal reflux disease without esophagitis: Secondary | ICD-10-CM | POA: Diagnosis present

## 2016-08-05 DIAGNOSIS — Z90710 Acquired absence of both cervix and uterus: Secondary | ICD-10-CM

## 2016-08-05 DIAGNOSIS — Z9884 Bariatric surgery status: Secondary | ICD-10-CM

## 2016-08-05 DIAGNOSIS — Z6839 Body mass index (BMI) 39.0-39.9, adult: Secondary | ICD-10-CM | POA: Diagnosis not present

## 2016-08-05 DIAGNOSIS — R0981 Nasal congestion: Secondary | ICD-10-CM | POA: Diagnosis not present

## 2016-08-05 HISTORY — DX: Bariatric surgery status: Z98.84

## 2016-08-05 HISTORY — PX: LAPAROSCOPIC GASTRIC SLEEVE RESECTION: SHX5895

## 2016-08-05 LAB — CREATININE, SERUM
CREATININE: 0.82 mg/dL (ref 0.44–1.00)
GFR calc Af Amer: >60 mL/min (ref >60)
GFR calc non Af Amer: >60 mL/min (ref >60)

## 2016-08-05 LAB — CBC
HCT: 35.7 % — ABNORMAL LOW (ref 36.0–46.0)
Hemoglobin: 11.5 g/dL — ABNORMAL LOW (ref 12.0–15.0)
MCH: 25.7 pg — ABNORMAL LOW (ref 26.0–34.0)
MCHC: 32.2 g/dL (ref 30.0–36.0)
MCV: 79.9 fL (ref 78.0–100.0)
PLATELETS: 286 10*3/uL (ref 150–400)
RBC: 4.47 MIL/uL (ref 3.87–5.11)
RDW: 15 % (ref 11.5–15.5)
WBC: 8.1 10*3/uL (ref 4.0–10.5)

## 2016-08-05 SURGERY — GASTRECTOMY, SLEEVE, LAPAROSCOPIC
Anesthesia: General | Site: Abdomen

## 2016-08-05 MED ORDER — CEFOXITIN SODIUM 2 G IV SOLR
2.0000 g | INTRAVENOUS | Status: AC
  Administered 2016-08-05: 2 g via INTRAVENOUS

## 2016-08-05 MED ORDER — DEXTROSE 5 % IV SOLN
INTRAVENOUS | Status: AC
  Filled 2016-08-05: qty 2

## 2016-08-05 MED ORDER — FENTANYL CITRATE (PF) 100 MCG/2ML IJ SOLN
25.0000 ug | INTRAMUSCULAR | Status: DC | PRN
  Administered 2016-08-05 (×3): 25 ug via INTRAVENOUS
  Administered 2016-08-05: 50 ug via INTRAVENOUS
  Administered 2016-08-05: 25 ug via INTRAVENOUS

## 2016-08-05 MED ORDER — FENTANYL CITRATE (PF) 100 MCG/2ML IJ SOLN
INTRAMUSCULAR | Status: AC
  Filled 2016-08-05: qty 2

## 2016-08-05 MED ORDER — SODIUM CHLORIDE 0.9 % IJ SOLN
INTRAMUSCULAR | Status: AC
  Filled 2016-08-05: qty 10

## 2016-08-05 MED ORDER — DEXAMETHASONE SODIUM PHOSPHATE 10 MG/ML IJ SOLN
INTRAMUSCULAR | Status: AC
  Filled 2016-08-05: qty 1

## 2016-08-05 MED ORDER — APREPITANT 40 MG PO CAPS
40.0000 mg | ORAL_CAPSULE | ORAL | Status: AC
  Administered 2016-08-05: 40 mg via ORAL
  Filled 2016-08-05: qty 1

## 2016-08-05 MED ORDER — LABETALOL HCL 5 MG/ML IV SOLN
INTRAVENOUS | Status: AC
  Administered 2016-08-05: 5 mg via INTRAVENOUS
  Filled 2016-08-05: qty 4

## 2016-08-05 MED ORDER — FENTANYL CITRATE (PF) 100 MCG/2ML IJ SOLN
INTRAMUSCULAR | Status: AC
  Administered 2016-08-05: 25 ug via INTRAVENOUS
  Filled 2016-08-05: qty 2

## 2016-08-05 MED ORDER — CHLORHEXIDINE GLUCONATE CLOTH 2 % EX PADS: 6.0000 | MEDICATED_PAD | Freq: Once | CUTANEOUS | Status: DC

## 2016-08-05 MED ORDER — PROMETHAZINE HCL 25 MG/ML IJ SOLN: 6.2500 mg | INTRAMUSCULAR | Status: DC | PRN

## 2016-08-05 MED ORDER — MORPHINE SULFATE (PF) 2 MG/ML IV SOLN
2.0000 mg | INTRAVENOUS | Status: DC | PRN
  Administered 2016-08-05 (×2): 2 mg via INTRAVENOUS
  Administered 2016-08-05: 4 mg via INTRAVENOUS
  Administered 2016-08-06 – 2016-08-07 (×7): 6 mg via INTRAVENOUS
  Filled 2016-08-05 (×2): qty 3
  Filled 2016-08-05: qty 1
  Filled 2016-08-05 (×2): qty 3
  Filled 2016-08-05: qty 1
  Filled 2016-08-05 (×3): qty 3
  Filled 2016-08-05 (×2): qty 1

## 2016-08-05 MED ORDER — FENTANYL CITRATE (PF) 100 MCG/2ML IJ SOLN
25.0000 ug | INTRAMUSCULAR | Status: DC | PRN
  Administered 2016-08-05 (×2): 50 ug via INTRAVENOUS

## 2016-08-05 MED ORDER — PROPOFOL 10 MG/ML IV BOLUS
INTRAVENOUS | Status: AC
  Filled 2016-08-05: qty 20

## 2016-08-05 MED ORDER — METOCLOPRAMIDE HCL 5 MG/ML IJ SOLN
INTRAMUSCULAR | Status: DC | PRN
  Administered 2016-08-05: 10 mg via INTRAVENOUS

## 2016-08-05 MED ORDER — LACTATED RINGERS IR SOLN
Status: DC | PRN
  Administered 2016-08-05: 1000 mL

## 2016-08-05 MED ORDER — MIDAZOLAM HCL 5 MG/5ML IJ SOLN
INTRAMUSCULAR | Status: DC | PRN
  Administered 2016-08-05: 2 mg via INTRAVENOUS

## 2016-08-05 MED ORDER — SUGAMMADEX SODIUM 500 MG/5ML IV SOLN
INTRAVENOUS | Status: AC
  Filled 2016-08-05: qty 5

## 2016-08-05 MED ORDER — LIDOCAINE 2% (20 MG/ML) 5 ML SYRINGE
INTRAMUSCULAR | Status: AC
  Filled 2016-08-05: qty 5

## 2016-08-05 MED ORDER — 0.9 % SODIUM CHLORIDE (POUR BTL) OPTIME
TOPICAL | Status: DC | PRN
  Administered 2016-08-05: 1000 mL

## 2016-08-05 MED ORDER — HEPARIN SODIUM (PORCINE) 5000 UNIT/ML IJ SOLN
5000.0000 [IU] | INTRAMUSCULAR | Status: AC
  Administered 2016-08-05: 5000 [IU] via SUBCUTANEOUS
  Filled 2016-08-05: qty 1

## 2016-08-05 MED ORDER — PROPOFOL 10 MG/ML IV BOLUS
INTRAVENOUS | Status: DC | PRN
  Administered 2016-08-05: 100 mg via INTRAVENOUS

## 2016-08-05 MED ORDER — PREMIER PROTEIN SHAKE
2.0000 [oz_av] | ORAL | Status: DC
  Administered 2016-08-07 – 2016-08-08 (×13): 2 [oz_av] via ORAL

## 2016-08-05 MED ORDER — HEPARIN SODIUM (PORCINE) 5000 UNIT/ML IJ SOLN
5000.0000 [IU] | Freq: Three times a day (TID) | INTRAMUSCULAR | Status: DC
  Administered 2016-08-05 – 2016-08-08 (×8): 5000 [IU] via SUBCUTANEOUS
  Filled 2016-08-05 (×8): qty 1

## 2016-08-05 MED ORDER — GLYCOPYRROLATE 0.2 MG/ML IV SOSY
PREFILLED_SYRINGE | INTRAVENOUS | Status: AC
  Filled 2016-08-05: qty 3

## 2016-08-05 MED ORDER — OXYCODONE HCL 5 MG/5ML PO SOLN
5.0000 mg | ORAL | Status: DC | PRN
  Administered 2016-08-07: 5 mg via ORAL
  Administered 2016-08-07: 10 mg via ORAL
  Administered 2016-08-07 – 2016-08-08 (×5): 5 mg via ORAL
  Filled 2016-08-05: qty 25
  Filled 2016-08-05: qty 5
  Filled 2016-08-05: qty 25
  Filled 2016-08-05: qty 5
  Filled 2016-08-05: qty 25
  Filled 2016-08-05 (×3): qty 5
  Filled 2016-08-05: qty 10

## 2016-08-05 MED ORDER — ONDANSETRON HCL 4 MG/2ML IJ SOLN
INTRAMUSCULAR | Status: AC
  Filled 2016-08-05: qty 2

## 2016-08-05 MED ORDER — SUGAMMADEX SODIUM 200 MG/2ML IV SOLN
INTRAVENOUS | Status: DC | PRN
  Administered 2016-08-05: 200 mg via INTRAVENOUS
  Administered 2016-08-05: 300 mg via INTRAVENOUS

## 2016-08-05 MED ORDER — BUPIVACAINE LIPOSOME 1.3 % IJ SUSP
20.0000 mL | Freq: Once | INTRAMUSCULAR | Status: AC
  Administered 2016-08-05: 20 mL
  Filled 2016-08-05: qty 20

## 2016-08-05 MED ORDER — LACTATED RINGERS IV SOLN
INTRAVENOUS | Status: DC
  Administered 2016-08-05 (×2): via INTRAVENOUS

## 2016-08-05 MED ORDER — FENTANYL CITRATE (PF) 250 MCG/5ML IJ SOLN
INTRAMUSCULAR | Status: AC
  Filled 2016-08-05: qty 5

## 2016-08-05 MED ORDER — ALBUTEROL SULFATE (2.5 MG/3ML) 0.083% IN NEBU
INHALATION_SOLUTION | RESPIRATORY_TRACT | Status: AC
  Administered 2016-08-05: 2.5 mg via RESPIRATORY_TRACT
  Filled 2016-08-05: qty 3

## 2016-08-05 MED ORDER — MIDAZOLAM HCL 2 MG/2ML IJ SOLN
INTRAMUSCULAR | Status: AC
  Filled 2016-08-05: qty 2

## 2016-08-05 MED ORDER — ACETAMINOPHEN 160 MG/5ML PO SOLN: 325.0000 mg | ORAL | Status: DC | PRN

## 2016-08-05 MED ORDER — LABETALOL HCL 5 MG/ML IV SOLN
5.0000 mg | Freq: Once | INTRAVENOUS | Status: AC
  Administered 2016-08-05: 5 mg via INTRAVENOUS

## 2016-08-05 MED ORDER — ONDANSETRON HCL 4 MG/2ML IJ SOLN
INTRAMUSCULAR | Status: DC | PRN
  Administered 2016-08-05: 4 mg via INTRAVENOUS

## 2016-08-05 MED ORDER — ALBUTEROL SULFATE (2.5 MG/3ML) 0.083% IN NEBU
2.5000 mg | INHALATION_SOLUTION | Freq: Once | RESPIRATORY_TRACT | Status: AC
  Administered 2016-08-05: 2.5 mg via RESPIRATORY_TRACT

## 2016-08-05 MED ORDER — KCL IN DEXTROSE-NACL 20-5-0.45 MEQ/L-%-% IV SOLN
INTRAVENOUS | Status: DC
  Administered 2016-08-05 – 2016-08-06 (×2): via INTRAVENOUS
  Administered 2016-08-06: 100 mL/h via INTRAVENOUS
  Administered 2016-08-07 – 2016-08-08 (×4): via INTRAVENOUS
  Filled 2016-08-05 (×8): qty 1000

## 2016-08-05 MED ORDER — EPHEDRINE SULFATE 50 MG/ML IJ SOLN
INTRAMUSCULAR | Status: DC | PRN
  Administered 2016-08-05: 15 mg via INTRAVENOUS

## 2016-08-05 MED ORDER — METOCLOPRAMIDE HCL 5 MG/ML IJ SOLN
INTRAMUSCULAR | Status: AC
  Filled 2016-08-05: qty 2

## 2016-08-05 MED ORDER — ROCURONIUM BROMIDE 10 MG/ML (PF) SYRINGE
PREFILLED_SYRINGE | INTRAVENOUS | Status: AC
  Filled 2016-08-05: qty 10

## 2016-08-05 MED ORDER — ACETAMINOPHEN 160 MG/5ML PO SOLN
650.0000 mg | ORAL | Status: DC | PRN
  Filled 2016-08-05: qty 20.3

## 2016-08-05 MED ORDER — ROCURONIUM BROMIDE 100 MG/10ML IV SOLN
INTRAVENOUS | Status: DC | PRN
  Administered 2016-08-05: 10 mg via INTRAVENOUS
  Administered 2016-08-05: 50 mg via INTRAVENOUS

## 2016-08-05 MED ORDER — FENTANYL CITRATE (PF) 100 MCG/2ML IJ SOLN
INTRAMUSCULAR | Status: DC | PRN
  Administered 2016-08-05 (×2): 100 ug via INTRAVENOUS
  Administered 2016-08-05: 25 ug via INTRAVENOUS
  Administered 2016-08-05 (×2): 50 ug via INTRAVENOUS
  Administered 2016-08-05: 25 ug via INTRAVENOUS

## 2016-08-05 MED ORDER — SODIUM CHLORIDE 0.9 % IJ SOLN
INTRAMUSCULAR | Status: DC | PRN
  Administered 2016-08-05: 10 mL

## 2016-08-05 MED ORDER — EPHEDRINE 5 MG/ML INJ
INTRAVENOUS | Status: AC
  Filled 2016-08-05: qty 10

## 2016-08-05 MED ORDER — FENTANYL CITRATE (PF) 100 MCG/2ML IJ SOLN
INTRAMUSCULAR | Status: AC
  Administered 2016-08-05: 50 ug via INTRAVENOUS
  Filled 2016-08-05: qty 2

## 2016-08-05 MED ORDER — ONDANSETRON HCL 4 MG/2ML IJ SOLN
4.0000 mg | INTRAMUSCULAR | Status: DC | PRN
  Administered 2016-08-07: 4 mg via INTRAVENOUS
  Filled 2016-08-05: qty 2

## 2016-08-05 MED ORDER — LIDOCAINE HCL (CARDIAC) 20 MG/ML IV SOLN
INTRAVENOUS | Status: DC | PRN
  Administered 2016-08-05: 50 mg via INTRAVENOUS

## 2016-08-05 MED ORDER — DEXAMETHASONE SODIUM PHOSPHATE 4 MG/ML IJ SOLN
INTRAMUSCULAR | Status: DC | PRN
  Administered 2016-08-05: 10 mg via INTRAVENOUS

## 2016-08-05 SURGICAL SUPPLY — 61 items
APPLICATOR COTTON TIP 6IN STRL (MISCELLANEOUS) ×2 IMPLANT
APPLIER CLIP 5 13 M/L LIGAMAX5 (MISCELLANEOUS)
APPLIER CLIP ROT 10 11.4 M/L (STAPLE)
APPLIER CLIP ROT 13.4 12 LRG (CLIP)
BLADE SURG 15 STRL LF DISP TIS (BLADE) ×1 IMPLANT
BLADE SURG 15 STRL SS (BLADE) ×1
CABLE HIGH FREQUENCY MONO STRZ (ELECTRODE) ×2 IMPLANT
CLIP APPLIE 5 13 M/L LIGAMAX5 (MISCELLANEOUS) IMPLANT
CLIP APPLIE ROT 10 11.4 M/L (STAPLE) IMPLANT
CLIP APPLIE ROT 13.4 12 LRG (CLIP) IMPLANT
COVER SURGICAL LIGHT HANDLE (MISCELLANEOUS) ×2 IMPLANT
DEVICE SUT QUICK LOAD TK 5 (STAPLE) IMPLANT
DEVICE SUT TI-KNOT TK 5X26 (MISCELLANEOUS) IMPLANT
DEVICE SUTURE ENDOST 10MM (ENDOMECHANICALS) IMPLANT
DEVICE TROCAR PUNCTURE CLOSURE (ENDOMECHANICALS) IMPLANT
DISSECTOR BLUNT TIP ENDO 5MM (MISCELLANEOUS) IMPLANT
ELECT REM PT RETURN 9FT ADLT (ELECTROSURGICAL) ×2
ELECTRODE REM PT RTRN 9FT ADLT (ELECTROSURGICAL) ×1 IMPLANT
GAUZE SPONGE 4X4 12PLY STRL (GAUZE/BANDAGES/DRESSINGS) IMPLANT
GLOVE BIOGEL M 8.0 STRL (GLOVE) ×2 IMPLANT
GOWN STRL REUS W/TWL XL LVL3 (GOWN DISPOSABLE) ×8 IMPLANT
HANDLE STAPLE EGIA 4 XL (STAPLE) ×2 IMPLANT
HOVERMATT SINGLE USE (MISCELLANEOUS) ×2 IMPLANT
IRRIG SUCT STRYKERFLOW 2 WTIP (MISCELLANEOUS) ×2
IRRIGATION SUCT STRKRFLW 2 WTP (MISCELLANEOUS) ×1 IMPLANT
KIT BASIN OR (CUSTOM PROCEDURE TRAY) ×2 IMPLANT
LIQUID BAND (GAUZE/BANDAGES/DRESSINGS) ×2 IMPLANT
MARKER SKIN DUAL TIP RULER LAB (MISCELLANEOUS) ×4 IMPLANT
NEEDLE SPNL 22GX3.5 QUINCKE BK (NEEDLE) ×2 IMPLANT
PACK UNIVERSAL I (CUSTOM PROCEDURE TRAY) ×2 IMPLANT
POUCH SPECIMEN RETRIEVAL 10MM (ENDOMECHANICALS) IMPLANT
RELOAD TRI 45 ART MED THCK BLK (STAPLE) ×2 IMPLANT
RELOAD TRI 45 ART MED THCK PUR (STAPLE) ×2 IMPLANT
RELOAD TRI 60 ART MED THCK BLK (STAPLE) ×2 IMPLANT
RELOAD TRI 60 ART MED THCK PUR (STAPLE) ×6 IMPLANT
SCISSORS LAP 5X45 EPIX DISP (ENDOMECHANICALS) ×2 IMPLANT
SEALANT SURGICAL APPL DUAL CAN (MISCELLANEOUS) IMPLANT
SHEARS HARMONIC ACE PLUS 45CM (MISCELLANEOUS) ×2 IMPLANT
SLEEVE ADV FIXATION 5X100MM (TROCAR) ×4 IMPLANT
SLEEVE GASTRECTOMY 36FR VISIGI (MISCELLANEOUS) ×2 IMPLANT
SOLUTION ANTI FOG 6CC (MISCELLANEOUS) ×2 IMPLANT
SPONGE LAP 18X18 X RAY DECT (DISPOSABLE) ×2 IMPLANT
STAPLER VISISTAT 35W (STAPLE) ×4 IMPLANT
SUT SURGIDAC NAB ES-9 0 48 120 (SUTURE) IMPLANT
SUT VIC AB 4-0 SH 18 (SUTURE) ×2 IMPLANT
SUT VICRYL 0 TIES 12 18 (SUTURE) ×2 IMPLANT
SYR 10ML ECCENTRIC (SYRINGE) ×2 IMPLANT
SYR 20CC LL (SYRINGE) ×2 IMPLANT
SYR 50ML LL SCALE MARK (SYRINGE) ×2 IMPLANT
TOWEL OR 17X26 10 PK STRL BLUE (TOWEL DISPOSABLE) ×4 IMPLANT
TOWEL OR NON WOVEN STRL DISP B (DISPOSABLE) ×4 IMPLANT
TRAY FOLEY W/METER SILVER 14FR (SET/KITS/TRAYS/PACK) IMPLANT
TRAY FOLEY W/METER SILVER 16FR (SET/KITS/TRAYS/PACK) IMPLANT
TROCAR ADV FIXATION 12X100MM (TROCAR) ×2 IMPLANT
TROCAR ADV FIXATION 5X100MM (TROCAR) ×2 IMPLANT
TROCAR BLADELESS 15MM (ENDOMECHANICALS) ×2 IMPLANT
TROCAR BLADELESS OPT 5 100 (ENDOMECHANICALS) ×2 IMPLANT
TUBE CALIBRATION LAPBAND (TUBING) ×2 IMPLANT
TUBING CONNECTING 10 (TUBING) ×4 IMPLANT
TUBING ENDO SMARTCAP (MISCELLANEOUS) ×2 IMPLANT
TUBING INSUF HEATED (TUBING) ×2 IMPLANT

## 2016-08-05 NOTE — Progress Notes (Signed)
Pt refused CPAP qhs.  Education provided but Pt states that she would rather wear EtCO2 monitoring tonight while she sleeps.  Pt encouraged to contact RT should she change her mind.  RT will continue to monitor as needed.
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
## 2016-08-05 NOTE — Op Note (Signed)
Surgeon: Kaylyn Lim, MD, FACS  Asst:  Alphonsa Overall, MD, FACS  Anes:  General endotracheal  Procedure: Laparoscopic sleeve gastrectomy and upper endoscopy  Diagnosis: Morbid obesity  Complications: none  EBL:   Minimal  cc  Description of Procedure:  The patient was take to OR 4 and given general anesthesia.  The abdomen was prepped with PCMX and draped sterilely.  A timeout was performed.  Access to the abdomen was achieved with a 5 mm Optiview through the left upper quadrant.  Following insufflation, the state of the abdomen was found to be free of adhesions.  The ViSiGi 36Fr tube was inserted to deflate the stomach and was pulled back into the esophagus.    The pylorus was identified and we measured 5 cm back and marked the antrum.  At that point we began dissection to take down the greater curvature of the stomach using the Harmonic scalpel.  This dissection was taken all the way up to the left crus.  Posterior attachments of the stomach were also taken down.    The ViSiGi tube was then passed into the antrum and suction applied so that it was snug along the lessor curvature.  The "crow's foot" or incisura was identified.  The sleeve gastrectomy was begun using the [employer] stapler beginning with a 4.5 cm black load followed by a 6 cm black load followed by 6 cm purple loads-all with TRS.  When the sleeve was complete the tube was taken off suction and insufflated briefly.  The tube was withdrawn.  Upper endoscopy was then performed by Dr. Lucia Gaskins and no bleeding or bubbles were seen5.     The specimen was extracted through the 15 trocar site.  This site was closed with the Ab Clos device using 0 vicryl.    Wounds were infiltrated with Exparel and closed with 4-0 Vicryl and Liquiban.    Matt B. Hassell Done, [place], [facility], [place]

## 2016-08-05 NOTE — Anesthesia Procedure Notes (Signed)
Procedure Name: Intubation Date/Time: 08/05/2016 10:08 AM Performed by: Deliah Boston Pre-anesthesia Checklist: Patient identified, Emergency Drugs available, Suction available and Patient being monitored Patient Re-evaluated:Patient Re-evaluated prior to inductionOxygen Delivery Method: Circle system utilized Preoxygenation: Pre-oxygenation with 100% oxygen Intubation Type: IV induction Ventilation: Mask ventilation without difficulty Laryngoscope Size: Mac and 3 Grade View: Grade I Tube type: Oral Tube size: 7.0 mm Number of attempts: 1 Airway Equipment and Method: Stylet and Oral airway Placement Confirmation: ETT inserted through vocal cords under direct vision,  positive ETCO2 and breath sounds checked- equal and bilateral Secured at: 21 cm Tube secured with: Tape Dental Injury: Teeth and Oropharynx as per pre-operative assessment

## 2016-08-05 NOTE — Op Note (Signed)
Name:  Judy [name] MRN: [id number] Date of Surgery: 08/05/2016  Preop Diagnosis:  Morbid Obesity  Postop Diagnosis:  Morbid Obesity (Weight - 255, BMI - 39.9), S/P Gastric Sleeve  Procedure:  Upper endoscopy  (Intraoperative)  Surgeon:  Alphonsa Overall, M.D.  Anesthesia:  GET  Indications for procedure: [name] is a 65 y.o. female whose primary care physician is Kelton Pillar, MD and has completed a Gastric Sleeve today by Dr. Hassell Done.  I am doing an intraoperative upper endoscopy to evaluate the gastric pouch.  Operative Note: The patient is under general anesthesia.  Dr. Hassell Done is laparoscoping the patient while I do an upper endoscopy to evaluate the stomach pouch.  With the patient intubated, I passed the Pentax upper endoscope without difficulty down the esophagus.  The esophago-gastric junction was at 42 cm.    The mucosa of the stomach looked viable and the staple line was intact without bleeding.  I advanced to the pylorus, but did not go through it.  While I insufflated the stomach pouch with air, Dr. Hassell Done  flooded the upper abdomen with saline to put the gastric pouch under saline.  There was no bubbling or evidence of a leak.  There was no evidence of narrowing of the pouch and the gastric sleeve looked tubular.  The scope was then withdrawn.  The esophagus was unremarkable and the patient tolerated the endoscopy without difficulty.  Alphonsa Overall, MD, [facility] Pager: [phone] Office phone:  [phone]

## 2016-08-05 NOTE — Anesthesia Postprocedure Evaluation (Signed)
Anesthesia Post Note  Patient: Judy [name]  Procedure(s) Performed: Procedure(s) (LRB): LAPAROSCOPIC GASTRIC SLEEVE RESECTION, UPPER ENDOSCOPY (N/A)  Patient location during evaluation: PACU Anesthesia Type: General Level of consciousness: awake and alert Pain management: pain level controlled Vital Signs Assessment: post-procedure vital signs reviewed and stable Respiratory status: spontaneous breathing, nonlabored ventilation, respiratory function stable and patient connected to nasal cannula oxygen Cardiovascular status: blood pressure returned to baseline and stable Postop Assessment: no signs of nausea or vomiting Anesthetic complications: no Comments:  osa orders written    Last Vitals:  Vitals:   08/05/16 1500 08/05/16 1600  BP: 140/60 138/66  Pulse: 75 74  Resp: 14 15  Temp: 36.9 C 36.7 C    Last Pain:  Vitals:   08/05/16 1603  TempSrc:   PainSc: 7                  Seneca Hoback J

## 2016-08-05 NOTE — Anesthesia Preprocedure Evaluation (Addendum)
Anesthesia Evaluation  Patient identified by MRN, date of birth, ID band Patient awake    Reviewed: Allergy & Precautions, NPO status , Patient's Chart, lab work & pertinent test results  Airway Mallampati: II  TM Distance: >3 FB Neck ROM: Full    Dental no notable dental hx.    Pulmonary shortness of breath, sleep apnea and Continuous Positive Airway Pressure Ventilation ,    Pulmonary exam normal breath sounds clear to auscultation       Cardiovascular hypertension, Pt. on medications Normal cardiovascular exam Rhythm:Regular Rate:Normal  Study Conclusions  - Left ventricle: The cavity size was normal. Systolic function was normal. The estimated ejection fraction was in the range of 55% to 60%. Wall motion was normal; there were no regional wall motion abnormalities. - Left atrium: The atrium was mildly dilated. - Atrial septum: No defect or patent foramen ovale was identified.ECHO 12-13-14:    Neuro/Psych PSYCHIATRIC DISORDERS Depression negative neurological ROS     GI/Hepatic Neg liver ROS, GERD  ,  Endo/Other  Morbid obesity  Renal/GU negative Renal ROS  negative genitourinary   Musculoskeletal  (+) Arthritis ,   Abdominal (+) + obese,   Peds negative pediatric ROS (+)  Hematology  (+) anemia ,   Anesthesia Other Findings   Reproductive/Obstetrics negative OB ROS                            Anesthesia Physical Anesthesia Plan  ASA: III  Anesthesia Plan: General   Post-op Pain Management:    Induction: Intravenous  Airway Management Planned: Oral ETT  Additional Equipment:   Intra-op Plan:   Post-operative Plan: Extubation in OR  Informed Consent: I have reviewed the patients History and Physical, chart, labs and discussed the procedure including the risks, benefits and alternatives for the proposed anesthesia with the patient or authorized representative who has  indicated his/her understanding and acceptance.   Dental advisory given  Plan Discussed with: CRNA  Anesthesia Plan Comments: (Left shoulder rotator cuff tear. Abduction to 90 degrees is OK per patient.)       Anesthesia Quick Evaluation

## 2016-08-05 NOTE — Interval H&P Note (Signed)
History and Physical Interval Note:  08/05/2016 9:11 AM  Judy [name]  has presented today for surgery, with the diagnosis of Morbid Obesity, Hyperlipidemia, HTN, GERD, Joint Pain  The various methods of treatment have been discussed with the patient and family. After consideration of risks, benefits and other options for treatment, the patient has consented to  Procedure(s): LAPAROSCOPIC GASTRIC SLEEVE RESECTION, UPPER ENDO (N/A) as a surgical intervention .  The patient's history has been reviewed, patient examined, no change in status, stable for surgery.  I have reviewed the patient's chart and labs.  Questions were answered to the patient's satisfaction.     Brandley Aldrete B

## 2016-08-05 NOTE — Discharge Instructions (Addendum)
Aprepitant Discharge Instructions  °On the day of surgery you were given the medication aprepitant. This medication interacts with hormonal forms of birth control (oral contraceptives and injected or implanted birth control) and may make them ineffective. °IF YOU USE ANY HORMONAL FORM OF BIRTH CONTROL, YOU MUST USE AN ADDITIONAL BARRIER BIRTH CONTROL METHOD FOR ONE MONTH after receiving aprepitant or there is a chance you could become pregnant. ° ° ° ° °GASTRIC BYPASS/SLEEVE ° Home Care Instructions ° ° These instructions are to help you care for yourself when you go home. ° °Call: If you have any problems. °• Call [phone] and ask for the surgeon on call °• If you need immediate assistance come to the ER at [facility]. Tell the ER staff you are a new post-op gastric bypass or gastric sleeve patient  °Signs and symptoms to report: • Severe  vomiting or nausea °o If you cannot handle clear liquids for longer than 1 day, call your surgeon °• Abdominal pain which does not get better after taking your pain medication °• Fever greater than 100.4°  F and chills °• Heart rate over 100 beats a minute °• Trouble breathing °• Chest pain °• Redness,  swelling, drainage, or foul odor at incision (surgical) sites °• If your incisions open or pull apart °• Swelling or pain in calf (lower leg) °• Diarrhea (Loose bowel movements that happen often), frequent watery, uncontrolled bowel movements °• Constipation, (no bowel movements for 3 days) if this happens: °o Take Milk of Magnesia, 2 tablespoons by mouth, 3 times a day for 2 days if needed °o Stop taking Milk of Magnesia once you have had a bowel movement °o Call your doctor if constipation continues °Or °o Take Miralax  (instead of Milk of Magnesia) following the label instructions °o Stop taking Miralax once you have had a bowel movement °o Call your doctor if constipation continues °• Anything you think is “abnormal for you” °  °Normal side effects after surgery: • Unable  to sleep at night or unable to concentrate °• Irritability °• Being tearful (crying) or depressed ° °These are common complaints, possibly related to your anesthesia, stress of surgery, and change in lifestyle, that usually go away a few weeks after surgery. If these feelings continue, call your medical doctor.  °Wound Care: You may have surgical glue, steri-strips, or staples over your incisions after surgery °• Surgical glue: Looks like clear film over your incisions and will wear off a little at a time °• Steri-strips: Adhesive strips of tape over your incisions. You may notice a yellowish color on skin under the steri-strips. This is used to make the steri-strips stick better. Do not pull the steri-strips off - let them fall off °• Staples: Staples may be removed before you leave the hospital °o If you go home with staples, call [facility] for an appointment with your surgeon’s nurse to have staples removed 10 days after surgery, [phone] °• Showering: You may shower two (2) days after your surgery unless your surgeon tells you differently °o Wash gently around incisions with warm soapy water, rinse well, and gently pat dry °o If you have a drain (tube from your incision), you may need someone to hold this while you shower °o No tub baths until staples are removed and incisions are healed °  °Medications: • Medications should be liquid or crushed if larger than the size of a dime °• Extended release pills (medication that releases a little bit at   a time through the  day) should not be crushed °• Depending on the size and number of medications you take, you may need to space (take a few throughout the day)/change the time you take your medications so that you do not over-fill your pouch (smaller stomach) °• Make sure you follow-up with you primary care physician to make medication changes needed during rapid weight loss and life -style changes °• If you have diabetes, follow up with your  doctor that orders your diabetes medication(s) within one week after surgery and check your blood sugar regularly ° °• Do not drive while taking narcotics (pain medications) ° °• Do not take acetaminophen (Tylenol) and Roxicet or Lortab Elixir at the same time since these pain medications contain acetaminophen °  °Diet:  °First 2 Weeks You will see the nutritionist about two (2) weeks after your surgery. The nutritionist will increase the types of foods you can eat if you are handling liquids well: °• If you have severe vomiting or nausea and cannot handle clear liquids lasting longer than 1 day call your surgeon °Protein Shake °• Drink at least 2 ounces of shake 5-6 times per day °• Each serving of protein shakes (usually 8-12 ounces) should have a minimum of: °o 15 grams of protein °o And no more than 5 grams of carbohydrate °• Goal for protein each day: °o Men = 80 grams per day °o Women = 60 grams per day °  ° • Protein powder may be added to fluids such as non-fat milk or Lactaid milk or Soy milk (limit to 35 grams added protein powder per serving) ° °Hydration °• Slowly increase the amount of water and other clear liquids as tolerated (See Acceptable Fluids) °• Slowly increase the amount of protein shake as tolerated °• Sip fluids slowly and throughout the day °• May use sugar substitutes in small amounts (no more than 6-8 packets per day; i.e. Splenda) ° °Fluid Goal °• The first goal is to drink at least 8 ounces of protein shake/drink per day (or as directed by the nutritionist); some examples of protein shakes are Syntrax Nectar, Adkins Advantage, EAS Edge HP, and Unjury. - See handout from pre-op Bariatric Education Class: °o Slowly increase the amount of protein shake you drink as tolerated °o You may find it easier to slowly sip shakes throughout the day °o It is important to get your proteins in first °• Your fluid goal is to drink 64-100 ounces of fluid daily °o It may take a few weeks to build up to  this  °• 32 oz. (or more) should be clear liquids °And °• 32 oz. (or more) should be full liquids (see below for examples) °• Liquids should not contain sugar, caffeine, or carbonation ° °Clear Liquids: °• Water of Sugar-free flavored water (i.e. Fruit H²O, Propel) °• Decaffeinated coffee or tea (sugar-free) °• Crystal lite, Wyler’s Lite, Minute Maid Lite °• Sugar-free Jell-O °• Bouillon or broth °• Sugar-free Popsicle:    - Less than 20 calories each; Limit 1 per day ° °Full Liquids: °                  Protein Shakes/Drinks + 2 choices per day of other full liquids °• Full liquids must be: °o No More Than 12 grams of Carbs per serving °o No More Than 3 grams of Fat per serving °• Strained low-fat cream soup °• Non-Fat milk °• Fat-free Lactaid Milk °• Sugar-free yogurt (Dannon Lite & Fit, Greek   yogurt) ° °  °Vitamins and Minerals • Start 1 day after surgery unless otherwise directed by your surgeon °• 2 Chewable Multivitamin / Multimineral Supplement with iron (i.e. Centrum for Adults) °• Vitamin B-12, 350-500 micrograms sub-lingual (place tablet under the tongue) each day °• Chewable Calcium Citrate with Vitamin D-3 °(Example: 3 Chewable Calcium  Plus 600 with Vitamin D-3) °o Take 500 mg three (3) times a day for a total of 1500 mg each day °o Do not take all 3 doses of calcium at one time as it may cause constipation, and you can only absorb 500 mg at a time °o Do not mix multivitamins containing iron with calcium supplements;  take 2 hours apart °o Do not substitute Tums (calcium carbonate) for your calcium °• Menstruating women and those at risk for anemia ( a blood disease that causes weakness) may need extra iron °o Talk to your doctor to see if you need more iron °• If you need extra iron: Total daily Iron recommendation (including Vitamins) is 50 to 100 mg Iron/day °• Do not stop taking or change any vitamins or minerals until you talk to your nutritionist or surgeon °• Your nutritionist and/or surgeon must  approve all vitamin and mineral supplements °  °Activity and Exercise: It is important to continue walking at home. Limit your physical activity as instructed by your doctor. During this time, use these guidelines: °• Do not lift anything greater than ten  (10) pounds for at least two (2) weeks °• Do not go back to work or drive until your surgeon says you can °• You may have sex when you feel comfortable °o It is VERY important for female patients to use a reliable birth control method; fertility often increase after surgery °o Do not get pregnant for at least 18 months °• Start exercising as soon as your doctor tells you that you can °o Make sure your doctor approves any physical activity °• Start with a simple walking program °• Walk 5-15 minutes each day, 7 days per week °• Slowly increase until you are walking 30-45 minutes per day °• Consider joining our BELT program. [phone] or email [email or web address] °  °Special Instructions Things to remember: °• Free counseling is available for you and your family through collaboration between [facility] and [employer]. Please call [phone] and leave a message °• Use your CPAP when sleeping if this applies to you °• Consider buying a medical alert bracelet that says you had lap-band surgery °  °  You will likely have your first fill (fluid added to your band) 6 - 8 weeks after surgery °• [facility] has a free Bariatric Surgery Support Group that meets monthly, the 3rd Thursday, 6pm. [employer] [facility] Class[facility]. You can see classes online at [email or web address] °• It is very important to keep all follow up appointments with your surgeon, nutritionist, primary care physician, and behavioral health practitioner °o After the first year, please follow up with your bariatric surgeon and nutritionist at least once a year in order to maintain best weight loss results °      °             [facility]:  [phone] ° °             [facility]: [phone] ° °             Bariatric Nurse Coordinator: [phone]  °Gastric Bypass/Sleeve Home Care   Instructions  Rev. 01/2013    ° °                                                    Reviewed and Endorsed °                                                   by [place] Patient Education Committee, Jan, 2014 ° ° ° ° ° ° ° ° ° °

## 2016-08-05 NOTE — Progress Notes (Signed)
Patient alert and oriented, op day.  Provided support and encouragement.  Encouraged pulmonary toilet, and ambulation.  All questions answered.  Will continue to monitor. 

## 2016-08-05 NOTE — Transfer of Care (Signed)
Immediate Anesthesia Transfer of Care Note  Patient: Judy [name]  Procedure(s) Performed: Procedure(s): LAPAROSCOPIC GASTRIC SLEEVE RESECTION, UPPER ENDOSCOPY (N/A)  Patient Location: PACU  Anesthesia Type:General  Level of Consciousness: Patient easily awoken, sedated, comfortable, cooperative, following commands, responds to stimulation.   Airway & Oxygen Therapy: Patient spontaneously breathing, ventilating well, oxygen via simple oxygen mask.  Post-op Assessment: Report given to PACU RN, vital signs reviewed and stable, moving all extremities.   Post vital signs: Reviewed and stable.  Complications: No apparent anesthesia complications  Last Vitals:  Vitals:   08/05/16 0714  BP: (!) 142/73  Pulse: 100  Resp: 18  Temp: 37.4 C    Last Pain:  Vitals:   08/05/16 0714  TempSrc: Oral         Complications: No apparent anesthesia complications

## 2016-08-06 LAB — CBC WITH DIFFERENTIAL/PLATELET
BASOS ABS: 0 10*3/uL (ref 0.0–0.1)
Basophils Relative: 0 %
EOS ABS: 0 10*3/uL (ref 0.0–0.7)
EOS PCT: 0 %
HCT: 34.7 % — ABNORMAL LOW (ref 36.0–46.0)
Hemoglobin: 11 g/dL — ABNORMAL LOW (ref 12.0–15.0)
LYMPHS PCT: 19 %
Lymphs Abs: 1.3 10*3/uL (ref 0.7–4.0)
MCH: 25.1 pg — ABNORMAL LOW (ref 26.0–34.0)
MCHC: 31.7 g/dL (ref 30.0–36.0)
MCV: 79 fL (ref 78.0–100.0)
MONO ABS: 0.7 10*3/uL (ref 0.1–1.0)
Monocytes Relative: 9 %
Neutro Abs: 5.2 10*3/uL (ref 1.7–7.7)
Neutrophils Relative %: 72 %
PLATELETS: 274 10*3/uL (ref 150–400)
RBC: 4.39 MIL/uL (ref 3.87–5.11)
RDW: 15 % (ref 11.5–15.5)
WBC: 7.2 10*3/uL (ref 4.0–10.5)

## 2016-08-06 LAB — HEMOGLOBIN AND HEMATOCRIT, BLOOD
HCT: 34.6 % — ABNORMAL LOW (ref 36.0–46.0)
HEMOGLOBIN: 10.9 g/dL — AB (ref 12.0–15.0)

## 2016-08-06 MED ORDER — ALBUTEROL SULFATE (2.5 MG/3ML) 0.083% IN NEBU
2.5000 mg | INHALATION_SOLUTION | Freq: Four times a day (QID) | RESPIRATORY_TRACT | Status: DC | PRN
  Administered 2016-08-06 – 2016-08-07 (×3): 2.5 mg via RESPIRATORY_TRACT
  Filled 2016-08-06 (×3): qty 3

## 2016-08-06 MED ORDER — ALBUTEROL SULFATE HFA 108 (90 BASE) MCG/ACT IN AERS: 2.0000 | INHALATION_SPRAY | Freq: Four times a day (QID) | RESPIRATORY_TRACT | Status: DC | PRN

## 2016-08-06 MED ORDER — FAMOTIDINE IN NACL 20-0.9 MG/50ML-% IV SOLN
20.0000 mg | Freq: Two times a day (BID) | INTRAVENOUS | Status: DC
  Administered 2016-08-06 – 2016-08-07 (×3): 20 mg via INTRAVENOUS
  Filled 2016-08-06 (×4): qty 50

## 2016-08-06 NOTE — Progress Notes (Signed)
Called to patients room -patient is audibly wheezing. States takes albuterol at home. MD paged.

## 2016-08-06 NOTE — Progress Notes (Signed)
Patient ID: Judy [name], female   DOB: Aug 14, 1951, 65 y.o.   MRN: [id number] [facility] Progress Note:   1 Day Post-Op  Subjective: Mental status is clear.  Minimal complaints Objective: Vital signs in last 24 hours: Temp:  [98 F (36.7 C)-98.8 F (37.1 C)] 98.6 F (37 C) (08/30 0550) Pulse Rate:  [67-93] 67 (08/30 0550) Resp:  [11-21] 14 (08/30 0550) BP: (135-183)/(60-93) 146/66 (08/30 0550) SpO2:  [95 %-100 %] 99 % (08/30 0550)  Intake/Output from previous day: 08/29 0701 - 08/30 0700 In: 2993 [I.V.:3345] Out: 2775 [Urine:2750; Blood:25] Intake/Output this shift: No intake/output data recorded.  Physical Exam: Work of breathing is not labored.  Incisions sore.  Doing well.    Lab Results:  Results for orders placed or performed during the hospital encounter of 08/05/16 (from the past 48 hour(s))  CBC     Status: Abnormal   Collection Time: 08/05/16 12:42 PM  Result Value Ref Range   WBC 8.1 4.0 - 10.5 K/uL   RBC 4.47 3.87 - 5.11 MIL/uL   Hemoglobin 11.5 (L) 12.0 - 15.0 g/dL   HCT 35.7 (L) 36.0 - 46.0 %   MCV 79.9 78.0 - 100.0 fL   MCH 25.7 (L) 26.0 - 34.0 pg   MCHC 32.2 30.0 - 36.0 g/dL   RDW 15.0 11.5 - 15.5 %   Platelets 286 150 - 400 K/uL  Creatinine, serum     Status: None   Collection Time: 08/05/16 12:42 PM  Result Value Ref Range   Creatinine, Ser 0.82 0.44 - 1.00 mg/dL   GFR calc non Af Amer >60 >60 mL/min   GFR calc Af Amer >60 >60 mL/min    Comment: (NOTE) The eGFR has been calculated using the CKD EPI equation. This calculation has not been validated in all clinical situations. eGFR's persistently <60 mL/min signify possible Chronic Kidney Disease.   CBC WITH DIFFERENTIAL     Status: Abnormal   Collection Time: 08/06/16  5:09 AM  Result Value Ref Range   WBC 7.2 4.0 - 10.5 K/uL   RBC 4.39 3.87 - 5.11 MIL/uL   Hemoglobin 11.0 (L) 12.0 - 15.0 g/dL   HCT 34.7 (L) 36.0 - 46.0 %   MCV 79.0 78.0 - 100.0 fL   MCH 25.1 (L) 26.0 - 34.0 pg   MCHC 31.7 30.0 - 36.0 g/dL   RDW 15.0 11.5 - 15.5 %   Platelets 274 150 - 400 K/uL   Neutrophils Relative % 72 %   Neutro Abs 5.2 1.7 - 7.7 K/uL   Lymphocytes Relative 19 %   Lymphs Abs 1.3 0.7 - 4.0 K/uL   Monocytes Relative 9 %   Monocytes Absolute 0.7 0.1 - 1.0 K/uL   Eosinophils Relative 0 %   Eosinophils Absolute 0.0 0.0 - 0.7 K/uL   Basophils Relative 0 %   Basophils Absolute 0.0 0.0 - 0.1 K/uL    Radiology/Results: No results found.  Anti-infectives: Anti-infectives    Start     Dose/Rate Route Frequency Ordered Stop   08/05/16 0721  cefOXitin (MEFOXIN) 2 g in dextrose 5 % 50 mL IVPB     2 g 100 mL/hr over 30 Minutes Intravenous On call to O.R. 08/05/16 0721 08/05/16 1011      Assessment/Plan: Problem List: Patient Active Problem List   Diagnosis Date Noted  . 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 Hyperlipidemia 04/15/2016  . Cough 08/26/2015  . PTSD (post-traumatic stress disorder) 05/16/2015  . Syncope  12/12/2014  . Depression 08/17/2014  . Left rotator cuff tear 08/17/2014  . Diarrhea 09/22/2013  . Intervertebral disc protrusion 08/22/2013  . Pulmonary hypertension ([place]) 08/03/2013  . Leg swelling 08/03/2013  . Lumbar spondylosis 07/03/2013  . History of hematuria 06/25/2013  . History of gastric polyp 06/25/2013  . Left knee pain 05/19/2013  . Insomnia 05/18/2013  . Abnormal CT of spine 03/28/2013  . Personal history of renal calculi 02/23/2013  . Axillary mass 02/07/2013  . Family history of breast cancer in sister 01/18/2013  . OSA (obstructive sleep apnea) 01/18/2013  . Personal history of colonic polyps 01/18/2013  . Gastric polyp 01/18/2013  . History of cervical dysplasia 06/15/2012  . OA (osteoarthritis) of knee 06/15/2012  . Seasonal depression ([place]) 06/15/2012  . Menopause 06/15/2012  . Urinary incontinence 06/15/2012  . Ventral hernia 06/15/2012  . S/P hysterectomy 06/14/2012  . Essential hypertension, benign 06/14/2012  .  Other and unspecified hyperlipidemia 06/14/2012  . GERD (gastroesophageal reflux disease) 06/14/2012    Advance to PD 1 bariatric diet and advance as tolerated.   1 Day Post-Op    LOS: 1 day   Matt B. Hassell Done, MD, [facility] [phone] beeper [phone]  08/06/2016 8:34 AM

## 2016-08-06 NOTE — Plan of Care (Signed)
Problem: Food- and Nutrition-Related Knowledge Deficit (NB-1.1) Goal: Nutrition education Formal process to instruct or train a patient/client in a skill or to impart knowledge to help patients/clients voluntarily manage or modify food choices and eating behavior to maintain or improve health. Outcome: Completed/Met Date Met: 08/06/16 Nutrition Education Note  Received consult for diet education per DROP protocol.   Discussed 2 week post op diet with pt. Emphasized that liquids must be non carbonated, non caffeinated, and sugar free. Fluid goals discussed. Reviewed progression of diet to include soft proteins at 7-10 days post-op. Pt to follow up with outpatient bariatric RD for further diet progression after 2 weeks. Multivitamins and minerals also reviewed. Teach back method used, pt expressed understanding, expect good compliance.   Diet: First 2 Weeks  You will see the dietitian about two (2) weeks after your surgery. The dietitian will increase the types of foods you can eat if you are handling liquids well:  If you have severe vomiting or nausea and cannot handle clear liquids lasting longer than 1 day, call your surgeon  Protein Shake  Drink at least 2 ounces of shake 5-6 times per day  Each serving of protein shakes (usually 8 - 12 ounces) should have a minimum of:  15 grams of protein  And no more than 5 grams of carbohydrate  Goal for protein each day:  Men = 80 grams per day  Women = 60 grams per day  Protein powder may be added to fluids such as non-fat milk or Lactaid milk or Soy milk (limit to 35 grams added protein powder per serving)   Hydration  Slowly increase the amount of water and other clear liquids as tolerated (See Acceptable Fluids)  Slowly increase the amount of protein shake as tolerated  Sip fluids slowly and throughout the day  May use sugar substitutes in small amounts (no more than 6 - 8 packets per day; i.e. Splenda)   Fluid Goal  The first goal is to  drink at least 8 ounces of protein shake/drink per day (or as directed by the nutritionist); some examples of protein shakes are Syntrax Nectar, Adkins Advantage, EAS Edge HP, and Unjury. See handout from pre-op Bariatric Education Class:  Slowly increase the amount of protein shake you drink as tolerated  You may find it easier to slowly sip shakes throughout the day  It is important to get your proteins in first  Your fluid goal is to drink 64 - 100 ounces of fluid daily  It may take a few weeks to build up to this  32 oz (or more) should be clear liquids  And  32 oz (or more) should be full liquids (see below for examples)  Liquids should not contain sugar, caffeine, or carbonation   Clear Liquids:  Water or Sugar-free flavored water (i.e. Fruit H2O, Propel)  Decaffeinated coffee or tea (sugar-free)  Crystal Lite, Wyler's Lite, Minute Maid Lite  Sugar-free Jell-O  Bouillon or broth  Sugar-free Popsicle: *Less than 20 calories each; Limit 1 per day   Full Liquids:  Protein Shakes/Drinks + 2 choices per day of other full liquids  Full liquids must be:  No More Than 12 grams of Carbs per serving  No More Than 3 grams of Fat per serving  Strained low-fat cream soup  Non-Fat milk  Fat-free Lactaid Milk  Sugar-free yogurt (Dannon Lite & Fit, Greek yogurt)     Judy Norby, MS, RD, LDN Pager: [phone] After Hours Pager: [phone]    

## 2016-08-07 LAB — CBC WITH DIFFERENTIAL/PLATELET
Basophils Absolute: 0 10*3/uL (ref 0.0–0.1)
Basophils Relative: 0 %
Eosinophils Absolute: 0.1 10*3/uL (ref 0.0–0.7)
Eosinophils Relative: 1 %
HEMATOCRIT: 32.5 % — AB (ref 36.0–46.0)
HEMOGLOBIN: 10.2 g/dL — AB (ref 12.0–15.0)
LYMPHS ABS: 2.2 10*3/uL (ref 0.7–4.0)
Lymphocytes Relative: 29 %
MCH: 24.8 pg — AB (ref 26.0–34.0)
MCHC: 31.4 g/dL (ref 30.0–36.0)
MCV: 79.1 fL (ref 78.0–100.0)
MONOS PCT: 8 %
Monocytes Absolute: 0.6 10*3/uL (ref 0.1–1.0)
NEUTROS ABS: 4.8 10*3/uL (ref 1.7–7.7)
NEUTROS PCT: 62 %
Platelets: 260 10*3/uL (ref 150–400)
RBC: 4.11 MIL/uL (ref 3.87–5.11)
RDW: 15.1 % (ref 11.5–15.5)
WBC: 7.7 10*3/uL (ref 4.0–10.5)

## 2016-08-07 MED ORDER — GUAIFENESIN 100 MG/5ML PO SOLN
5.0000 mL | ORAL | Status: DC | PRN
  Administered 2016-08-07: 100 mg via ORAL
  Filled 2016-08-07: qty 10

## 2016-08-07 NOTE — Progress Notes (Signed)
Pt. Refused cpap. 

## 2016-08-08 MED ORDER — GUAIFENESIN 100 MG/5ML PO SOLN: 5.0000 mL | ORAL | 0 refills | Status: DC | PRN

## 2016-08-08 MED FILL — oxyCODONE HCL 5 MG/5ML SOLN: 5 | 3 days supply | Qty: 200 | Fill #0

## 2016-08-08 NOTE — Discharge Summary (Signed)
Physician Discharge Summary  Patient ID: Judy [name] MRN: [id number] DOB/AGE: 1951/05/30 65 y.o.  Admit date: 08/05/2016 Discharge date: 08/08/2016  Admission Diagnoses:  Morbid obesity  Discharge Diagnoses:  same  Principal Problem:   S/P laparoscopic sleeve gastrectomy   Surgery:  Laparoscopic sleeve gastrectomy  Discharged Condition: same  Hospital Course:   Had surgery.  Did well postop but had some cough and congestion.  Ready for discharge  Consults: none  Significant Diagnostic Studies: none    Discharge Exam: Blood pressure (!) 147/73, pulse 73, temperature 99.3 F (37.4 C), resp. rate 18, height 5\' 7"  (1.702 m), weight 113.9 kg (251 lb 3.2 oz), SpO2 97 %. Incisions ok  Disposition: 01-Home or Self Care  Discharge Instructions    Ambulate hourly while awake    Complete by:  As directed   Call MD for:  difficulty breathing, headache or visual disturbances    Complete by:  As directed   Call MD for:  persistant dizziness or light-headedness    Complete by:  As directed   Call MD for:  persistant nausea and vomiting    Complete by:  As directed   Call MD for:  redness, tenderness, or signs of infection (pain, swelling, redness, odor or green/yellow discharge around incision site)    Complete by:  As directed   Call MD for:  severe uncontrolled pain    Complete by:  As directed   Call MD for:  temperature >101 F    Complete by:  As directed   Diet bariatric full liquid    Complete by:  As directed   Incentive spirometry    Complete by:  As directed   Perform hourly while awake       Medication List    TAKE these medications   acetaminophen 500 MG tablet Commonly known as:  TYLENOL Take 1,000 mg by mouth every 6 (six) hours as needed for mild pain or moderate pain.   albuterol 108 (90 Base) MCG/ACT inhaler Commonly known as:  PROVENTIL HFA;VENTOLIN HFA Inhale 2 puffs into the lungs every 6 (six) hours as needed for wheezing or shortness of breath.    candesartan 16 MG tablet Commonly known as:  ATACAND Take 1 tablet (16 mg total) by mouth daily.   celecoxib 200 MG capsule Commonly known as:  CELEBREX Take 200 mg by mouth 2 (two) times daily. Notes to patient:  Avoid NSAIDs for 6-8 weeks after surgery   cyclobenzaprine 10 MG tablet Commonly known as:  FLEXERIL Take 1 tablet (10 mg total) by mouth 3 (three) times daily as needed for muscle spasms.   eszopiclone 2 MG Tabs tablet Commonly known as:  LUNESTA Take 1 tablet (2 mg total) by mouth at bedtime as needed for sleep. Take immediately before bedtime   guaiFENesin 100 MG/5ML Soln Commonly known as:  ROBITUSSIN Take 5 mLs (100 mg total) by mouth every 4 (four) hours as needed for cough or to loosen phlegm.   hydrochlorothiazide 12.5 MG tablet Commonly known as:  HYDRODIURIL Take 1 tablet (12.5 mg total) by mouth daily. Notes to patient:  Monitor Blood Pressure Daily and keep a log for primary care physician.  Monitor for symptoms of dehydration.  You may need to make changes to your medications with rapid weight loss.     HYDROcodone-acetaminophen 10-325 MG tablet Commonly known as:  NORCO Take 1 tablet by mouth every 8 (eight) hours as needed. What changed:  reasons to take this   lansoprazole  30 MG capsule Commonly known as:  PREVACID Take 1 capsule (30 mg total) by mouth daily at 12 noon. What changed:  when to take this   nystatin-triamcinolone cream Commonly known as:  MYCOLOG II Apply 1 application topically 2 (two) times daily.   rosuvastatin 10 MG tablet Commonly known as:  CRESTOR Take 1 tablet (10 mg total) by mouth daily.   sertraline 50 MG tablet Commonly known as:  ZOLOFT Take 3 tablets (150 mg total) by mouth daily.   tizanidine 6 MG capsule Commonly known as:  ZANAFLEX Take 1 capsule (6 mg total) by mouth 3 (three) times daily as needed for muscle spasms.   Vitamin D 1000 units capsule Take 1,000 Units by mouth daily.      Follow-up  Information    Pedro Earls, MD. Go on 08/28/2016.   Specialty:  General Surgery Why:  at 12:00 PM for post-op check Contact information: [address] [place] [address] [phone]        Zaineb Nowaczyk B, MD .   Specialty:  General Surgery Contact information: [place] [address] [phone]           Signed: Pedro Earls 08/08/2016, 8:05 AM

## 2016-08-08 NOTE — Progress Notes (Signed)
Patient alert and oriented, pain is controlled. Patient is tolerating fluids, advanced to protein shake today, patient is tolerating well.  Reviewed Gastric sleeve discharge instructions with patient and patient is able to articulate understanding.  Provided information on BELT program, Support Group and [facility] outpatient pharmacy. All questions answered, will continue to monitor.  

## 2016-08-19 ENCOUNTER — Encounter: Payer: Federal, State, Local not specified - PPO | Attending: Surgery

## 2016-08-19 DIAGNOSIS — Z01818 Encounter for other preprocedural examination: Secondary | ICD-10-CM | POA: Diagnosis not present

## 2016-08-20 NOTE — Progress Notes (Addendum)
Bariatric Class:  Appt start time: 1530 end time:  1630.  2 Week Post-Operative Nutrition Class  Patient was seen on 08/19/2016 for Post-Operative Nutrition education at [facility].   Surgery date: 08/05/2016 Surgery type: sleeve gastrectomy [facility] weight at [facility]: 256.5 lbs on 04/18/2016, 260 lbs on 06/09/2016 Weight today: 239.4 lbs  Weight change: 20.6 lbs  TANITA  BODY COMP RESULTS  05/12/16 08/19/16   BMI (kg/m^2) 40.3 37.5   Fat Mass (lbs) 139.2 126.4   Fat Free Mass (lbs) 117.8 113.0   Total Body Water (lbs) 86.2 82.2   The following the learning objectives were met by the patient during this course:  Identifies Phase 3A (Soft, High Proteins) Dietary Goals and will begin from 2 weeks post-operatively to 2 months post-operatively  Identifies appropriate sources of fluids and proteins   States protein recommendations and appropriate sources post-operatively  Identifies the need for appropriate texture modifications, mastication, and bite sizes when consuming solids  Identifies appropriate multivitamin and calcium sources post-operatively  Describes the need for physical activity post-operatively and will follow MD recommendations  States when to call healthcare provider regarding medication questions or post-operative complications  Handouts given during class include:  Phase 3A: Soft, High Protein Diet Handout  Follow-Up Plan: Patient will follow-up at [facility] in 6 weeks for 2 month post-op nutrition visit for diet advancement per MD.

## 2016-08-22 ENCOUNTER — Ambulatory Visit (INDEPENDENT_AMBULATORY_CARE_PROVIDER_SITE_OTHER): Payer: Federal, State, Local not specified - PPO | Admitting: Psychology

## 2016-08-22 DIAGNOSIS — F4323 Adjustment disorder with mixed anxiety and depressed mood: Secondary | ICD-10-CM

## 2016-09-03 ENCOUNTER — Ambulatory Visit (INDEPENDENT_AMBULATORY_CARE_PROVIDER_SITE_OTHER): Payer: Federal, State, Local not specified - PPO | Admitting: Psychology

## 2016-09-03 DIAGNOSIS — F4323 Adjustment disorder with mixed anxiety and depressed mood: Secondary | ICD-10-CM

## 2016-09-24 ENCOUNTER — Ambulatory Visit (INDEPENDENT_AMBULATORY_CARE_PROVIDER_SITE_OTHER): Payer: Federal, State, Local not specified - PPO | Admitting: Psychology

## 2016-09-24 DIAGNOSIS — F4323 Adjustment disorder with mixed anxiety and depressed mood: Secondary | ICD-10-CM | POA: Diagnosis not present

## 2016-10-01 ENCOUNTER — Encounter: Payer: Federal, State, Local not specified - PPO | Attending: Surgery | Admitting: Dietician

## 2016-10-01 DIAGNOSIS — Z01818 Encounter for other preprocedural examination: Secondary | ICD-10-CM | POA: Diagnosis not present

## 2016-10-01 NOTE — Progress Notes (Signed)
  Follow-up visit:  8 Weeks Post-Operative Sleeve Gastrectomy Surgery  Medical Nutrition Therapy:  Appt start time: 1100 end time:  1130.  Primary concerns today: Post-operative Bariatric Surgery Nutrition Management. Returns with a 14.2 lb weight loss in past 6 weeks. Overall doing well@   Surgery date: 08/05/2016 Surgery type: sleeve gastrectomy [facility] weight at [facility]: 256.5 lbs on 04/18/2016, 260 lbs on 06/09/2016 Weight today: 225.2 lbs  Weight change: 14.2 lbs Total weight loss: 35 lbs   TANITA  BODY COMP RESULTS  05/12/16 08/19/16 10/01/16   BMI (kg/m^2) 40.3 37.5 35.3   Fat Mass (lbs) 139.2 126.4 113.6   Fat Free Mass (lbs) 117.8 113.0 111.6   Total Body Water (lbs) 86.2 82.2 80.6    Preferred Learning Style:   No preference indicated   Learning Readiness:   Ready  24-hr recall: B (AM): Premier protein or triple zero yogurt or eggs with sausage and cheese (15-30 g) Snk (AM): None  L (PM): 3 slices thin [place] and 1/2 slice cheese or 3-4 teaspoons beans and greens (5-10 g) Snk (PM): shake (30 g)  D (PM): 4-5 shrimp with veggies or 1-1.5 oz seafood/meat or yogurt with pb (7-15 g) Snk (PM): part of a shake maybe   2 Premier shakes or 1 shake and + yogurt each day   Fluid intake:  16 oz water, 32 oz ginger tea, coffee rarely, 11 oz protein shake (68 oz or more) Estimated total protein intake: over 60 grams per day  Medications: see list  Supplementation: taking  Using straws: No Drinking while eating: tried once  Hair loss: No Carbonated beverages: No N/V/D/C: vomited once when she had codeine at hospital, nausea when over ate, has overcome constipation Dumping syndrome: No   Recent physical activity:  BELT 3 x week, swim 4-5 x week   Progress Towards Goal(s):  In progress.  Handouts given during visit include:  Phase 3B High Protein + Non Starchy Vegetables   Nutritional Diagnosis:  [place]-3.3 Overweight/obesity related to past poor dietary habits and physical  inactivity as evidenced by patient w/ recent sleeve gastrectomy surgery following dietary guidelines for continued weight loss.    Intervention:  Nutrition counseling provided. Goals:  Follow Phase 3B: High Protein + Non-Starchy Vegetables  Eat 3-6 small meals/snacks, every 3-5 hrs  Increase lean protein foods to meet 60g goal  Increase fluid intake to 64oz +  Avoid drinking 15 minutes before, during and 30 minutes after eating  Aim for >30 min of physical activity daily  Teaching Method Utilized:  Visual Auditory Hands on  Barriers to learning/adherence to lifestyle change: none  Demonstrated degree of understanding via:  Teach Back   Monitoring/Evaluation:  Dietary intake, exercise, and body weight. Follow up in 3 months for 5 month post-op visit.

## 2016-10-01 NOTE — Patient Instructions (Addendum)
Goals:  Follow Phase 3B: High Protein + Non-Starchy Vegetables  Eat 3-6 small meals/snacks, every 3-5 hrs  Increase lean protein foods to meet 60g goal  Increase fluid intake to 64oz +  Avoid drinking 15 minutes before, during and 30 minutes after eating  Aim for >30 min of physical activity daily   Surgery date: 08/05/2016 Surgery type: sleeve gastrectomy Start weight at [facility]: 256.5 lbs on 04/18/2016, 260 lbs on 06/09/2016 Weight today: 225.2 lbs  Weight change: 14.2 lbs Total weight loss: 35 lbs   TANITA  BODY COMP RESULTS  05/12/16 08/19/16 10/01/16   BMI (kg/m^2) 40.3 37.5 35.3   Fat Mass (lbs) 139.2 126.4 113.6   Fat Free Mass (lbs) 117.8 113.0 111.6   Total Body Water (lbs) 86.2 82.2 80.6

## 2016-10-08 ENCOUNTER — Ambulatory Visit (INDEPENDENT_AMBULATORY_CARE_PROVIDER_SITE_OTHER): Payer: Federal, State, Local not specified - PPO | Admitting: Psychology

## 2016-10-08 DIAGNOSIS — F4323 Adjustment disorder with mixed anxiety and depressed mood: Secondary | ICD-10-CM | POA: Diagnosis not present

## 2016-10-27 ENCOUNTER — Ambulatory Visit (INDEPENDENT_AMBULATORY_CARE_PROVIDER_SITE_OTHER): Payer: Federal, State, Local not specified - PPO | Admitting: Psychology

## 2016-10-27 DIAGNOSIS — F4310 Post-traumatic stress disorder, unspecified: Secondary | ICD-10-CM

## 2016-10-27 DIAGNOSIS — F4323 Adjustment disorder with mixed anxiety and depressed mood: Secondary | ICD-10-CM | POA: Diagnosis not present

## 2016-11-18 ENCOUNTER — Ambulatory Visit: Payer: Federal, State, Local not specified - PPO | Admitting: Psychology

## 2016-11-24 ENCOUNTER — Ambulatory Visit (INDEPENDENT_AMBULATORY_CARE_PROVIDER_SITE_OTHER): Payer: Federal, State, Local not specified - PPO | Admitting: Psychology

## 2016-11-24 DIAGNOSIS — F4323 Adjustment disorder with mixed anxiety and depressed mood: Secondary | ICD-10-CM | POA: Diagnosis not present

## 2016-12-22 ENCOUNTER — Ambulatory Visit (INDEPENDENT_AMBULATORY_CARE_PROVIDER_SITE_OTHER): Payer: Federal, State, Local not specified - PPO | Admitting: Psychology

## 2016-12-22 DIAGNOSIS — F4310 Post-traumatic stress disorder, unspecified: Secondary | ICD-10-CM | POA: Diagnosis not present

## 2016-12-22 DIAGNOSIS — F4323 Adjustment disorder with mixed anxiety and depressed mood: Secondary | ICD-10-CM | POA: Diagnosis not present

## 2016-12-31 ENCOUNTER — Ambulatory Visit: Payer: Self-pay | Admitting: Dietician

## 2017-01-14 ENCOUNTER — Ambulatory Visit (INDEPENDENT_AMBULATORY_CARE_PROVIDER_SITE_OTHER): Payer: Federal, State, Local not specified - PPO | Admitting: Psychology

## 2017-01-14 DIAGNOSIS — F4310 Post-traumatic stress disorder, unspecified: Secondary | ICD-10-CM | POA: Diagnosis not present

## 2017-01-14 DIAGNOSIS — F4323 Adjustment disorder with mixed anxiety and depressed mood: Secondary | ICD-10-CM | POA: Diagnosis not present

## 2017-02-04 ENCOUNTER — Ambulatory Visit: Payer: Self-pay | Admitting: Neurology

## 2017-02-25 ENCOUNTER — Other Ambulatory Visit: Payer: Self-pay | Admitting: Physician Assistant

## 2017-03-27 ENCOUNTER — Ambulatory Visit (INDEPENDENT_AMBULATORY_CARE_PROVIDER_SITE_OTHER): Payer: Federal, State, Local not specified - PPO | Admitting: Psychology

## 2017-03-27 DIAGNOSIS — F4310 Post-traumatic stress disorder, unspecified: Secondary | ICD-10-CM | POA: Diagnosis not present

## 2017-03-27 DIAGNOSIS — F4323 Adjustment disorder with mixed anxiety and depressed mood: Secondary | ICD-10-CM | POA: Diagnosis not present

## 2017-03-30 ENCOUNTER — Other Ambulatory Visit: Payer: Self-pay | Admitting: Neurology

## 2017-03-30 ENCOUNTER — Encounter: Payer: Self-pay | Admitting: Neurology

## 2017-04-01 ENCOUNTER — Ambulatory Visit (INDEPENDENT_AMBULATORY_CARE_PROVIDER_SITE_OTHER): Payer: Federal, State, Local not specified - PPO | Admitting: Neurology

## 2017-04-01 ENCOUNTER — Encounter: Payer: Self-pay | Admitting: Neurology

## 2017-04-01 ENCOUNTER — Other Ambulatory Visit: Payer: Self-pay | Admitting: Internal Medicine

## 2017-04-01 VITALS — BP 126/70 | HR 71 | Temp 98.1°F | Resp 16 | Ht 67.75 in | Wt 208.0 lb

## 2017-04-01 DIAGNOSIS — G44219 Episodic tension-type headache, not intractable: Secondary | ICD-10-CM

## 2017-04-01 DIAGNOSIS — N6320 Unspecified lump in the left breast, unspecified quadrant: Secondary | ICD-10-CM
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
## 2017-04-01 NOTE — Patient Instructions (Signed)
Follow up as needed

## 2017-04-03 ENCOUNTER — Ambulatory Visit: Payer: Self-pay | Admitting: Neurology

## 2017-04-06 ENCOUNTER — Other Ambulatory Visit: Payer: Self-pay

## 2017-04-07 ENCOUNTER — Ambulatory Visit
Admission: RE | Admit: 2017-04-07 | Discharge: 2017-04-07 | Disposition: A | Discharge status: Home / Self Care | Payer: Federal, State, Local not specified - PPO | Source: Ambulatory Visit | Attending: Internal Medicine | Admitting: Internal Medicine

## 2017-04-07 DIAGNOSIS — N6320 Unspecified lump in the left breast, unspecified quadrant: Secondary | ICD-10-CM

## 2017-04-14 ENCOUNTER — Ambulatory Visit: Payer: Federal, State, Local not specified - PPO | Admitting: Psychology

## 2017-04-15 ENCOUNTER — Ambulatory Visit (INDEPENDENT_AMBULATORY_CARE_PROVIDER_SITE_OTHER): Payer: Federal, State, Local not specified - PPO | Admitting: Psychology

## 2017-04-15 DIAGNOSIS — F4323 Adjustment disorder with mixed anxiety and depressed mood: Secondary | ICD-10-CM

## 2017-04-15 DIAGNOSIS — F4310 Post-traumatic stress disorder, unspecified: Secondary | ICD-10-CM

## 2017-05-06 ENCOUNTER — Ambulatory Visit (INDEPENDENT_AMBULATORY_CARE_PROVIDER_SITE_OTHER): Payer: Federal, State, Local not specified - PPO | Admitting: Psychology

## 2017-05-06 DIAGNOSIS — F4323 Adjustment disorder with mixed anxiety and depressed mood: Secondary | ICD-10-CM | POA: Diagnosis not present

## 2017-06-15 ENCOUNTER — Ambulatory Visit (INDEPENDENT_AMBULATORY_CARE_PROVIDER_SITE_OTHER): Payer: Federal, State, Local not specified - PPO | Admitting: Psychology

## 2017-06-15 DIAGNOSIS — F4323 Adjustment disorder with mixed anxiety and depressed mood: Secondary | ICD-10-CM | POA: Diagnosis not present

## 2017-06-15 DIAGNOSIS — F4310 Post-traumatic stress disorder, unspecified: Secondary | ICD-10-CM

## 2017-07-10 ENCOUNTER — Ambulatory Visit (INDEPENDENT_AMBULATORY_CARE_PROVIDER_SITE_OTHER): Payer: Federal, State, Local not specified - PPO | Admitting: Psychology

## 2017-07-10 DIAGNOSIS — F4323 Adjustment disorder with mixed anxiety and depressed mood: Secondary | ICD-10-CM | POA: Diagnosis not present

## 2017-07-10 DIAGNOSIS — F4310 Post-traumatic stress disorder, unspecified: Secondary | ICD-10-CM | POA: Diagnosis not present

## 2017-07-29 ENCOUNTER — Ambulatory Visit: Payer: Federal, State, Local not specified - PPO | Admitting: Psychology

## 2017-08-03 ENCOUNTER — Ambulatory Visit (INDEPENDENT_AMBULATORY_CARE_PROVIDER_SITE_OTHER): Payer: Federal, State, Local not specified - PPO | Admitting: Psychology

## 2017-08-03 DIAGNOSIS — F4310 Post-traumatic stress disorder, unspecified: Secondary | ICD-10-CM

## 2017-08-03 DIAGNOSIS — F4323 Adjustment disorder with mixed anxiety and depressed mood: Secondary | ICD-10-CM

## 2017-08-24 ENCOUNTER — Ambulatory Visit (INDEPENDENT_AMBULATORY_CARE_PROVIDER_SITE_OTHER): Payer: Federal, State, Local not specified - PPO | Admitting: Psychology

## 2017-08-24 DIAGNOSIS — F4323 Adjustment disorder with mixed anxiety and depressed mood: Secondary | ICD-10-CM | POA: Diagnosis not present

## 2017-08-24 DIAGNOSIS — F4310 Post-traumatic stress disorder, unspecified: Secondary | ICD-10-CM | POA: Diagnosis not present

## 2017-09-03 ENCOUNTER — Ambulatory Visit: Payer: Federal, State, Local not specified - PPO | Admitting: Psychology

## 2017-09-10 ENCOUNTER — Ambulatory Visit (INDEPENDENT_AMBULATORY_CARE_PROVIDER_SITE_OTHER): Payer: Federal, State, Local not specified - PPO | Admitting: Psychology

## 2017-09-10 DIAGNOSIS — F4323 Adjustment disorder with mixed anxiety and depressed mood: Secondary | ICD-10-CM | POA: Diagnosis not present

## 2017-09-10 DIAGNOSIS — F4310 Post-traumatic stress disorder, unspecified: Secondary | ICD-10-CM | POA: Diagnosis not present

## 2017-10-13 ENCOUNTER — Ambulatory Visit (INDEPENDENT_AMBULATORY_CARE_PROVIDER_SITE_OTHER): Payer: Federal, State, Local not specified - PPO | Admitting: Psychology

## 2017-10-13 DIAGNOSIS — F4323 Adjustment disorder with mixed anxiety and depressed mood: Secondary | ICD-10-CM | POA: Diagnosis not present

## 2017-10-13 DIAGNOSIS — F4310 Post-traumatic stress disorder, unspecified: Secondary | ICD-10-CM

## 2017-10-19 ENCOUNTER — Telehealth: Payer: Self-pay | Admitting: Medical

## 2017-10-19 ENCOUNTER — Ambulatory Visit: Payer: Federal, State, Local not specified - PPO | Admitting: Medical

## 2017-10-19 ENCOUNTER — Encounter: Payer: Self-pay | Admitting: Medical

## 2017-10-19 VITALS — BP 136/68 | HR 82 | Temp 98.3°F | Resp 16 | Ht 67.0 in | Wt 217.2 lb

## 2017-10-19 DIAGNOSIS — I10 Essential (primary) hypertension: Secondary | ICD-10-CM

## 2017-10-19 DIAGNOSIS — R0981 Nasal congestion: Secondary | ICD-10-CM

## 2017-10-19 DIAGNOSIS — J020 Streptococcal pharyngitis: Secondary | ICD-10-CM

## 2017-10-19 DIAGNOSIS — J029 Acute pharyngitis, unspecified: Secondary | ICD-10-CM | POA: Diagnosis not present

## 2017-10-19 DIAGNOSIS — J40 Bronchitis, not specified as acute or chronic: Secondary | ICD-10-CM | POA: Diagnosis not present

## 2017-10-19 LAB — POCT RAPID STREP A (OFFICE): RAPID STREP A SCREEN: POSITIVE — AB

## 2017-10-19 MED ORDER — AZITHROMYCIN 250 MG PO TABS: ORAL_TABLET | ORAL | 0 refills | Status: DC

## 2017-10-19 MED ORDER — ALBUTEROL SULFATE HFA 108 (90 BASE) MCG/ACT IN AERS: 2.0000 | INHALATION_SPRAY | Freq: Four times a day (QID) | RESPIRATORY_TRACT | 2 refills | Status: DC | PRN

## 2017-10-19 MED ORDER — HYDROCODONE-HOMATROPINE 5-1.5 MG/5ML PO SYRP: 5.0000 mL | ORAL_SOLUTION | Freq: Three times a day (TID) | ORAL | 0 refills | Status: DC | PRN

## 2017-10-19 MED ORDER — FLUTICASONE PROPIONATE 50 MCG/ACT NA SUSP: 2.0000 | Freq: Every day | NASAL | 1 refills | Status: DC

## 2017-10-19 NOTE — Patient Instructions (Signed)
For strep throat and probable bronchitis, I will rx azithromycin which has both strep and bronchitis coverage.   For cough rx hycodan. For nasal congestion can use flonase. But before flying can use afrin(limit use).  For any wheezing rx albuterol to use as directed. Follow up in 7 days or as needed
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
## 2017-10-19 NOTE — Telephone Encounter (Signed)
I was mistaken. This was the patient with strep that was definite. You read. I was thinking of other person when I resulted her note.

## 2017-10-28 ENCOUNTER — Other Ambulatory Visit (INDEPENDENT_AMBULATORY_CARE_PROVIDER_SITE_OTHER): Payer: Federal, State, Local not specified - PPO

## 2017-10-28 DIAGNOSIS — I10 Essential (primary) hypertension: Secondary | ICD-10-CM

## 2017-10-28 LAB — COMPREHENSIVE METABOLIC PANEL
ALT: 10 U/L (ref 0–35)
AST: 16 U/L (ref 0–37)
Albumin: 3.9 g/dL (ref 3.5–5.2)
Alkaline Phosphatase: 83 U/L (ref 39–117)
BUN: 11 mg/dL (ref 6–23)
CHLORIDE: 105 meq/L (ref 96–112)
CO2: 31 meq/L (ref 19–32)
Calcium: 9.3 mg/dL (ref 8.4–10.5)
Creatinine, Ser: 0.72 mg/dL (ref 0.40–1.20)
GFR: 104.81 mL/min (ref >60.00)
GLUCOSE: 102 mg/dL — AB (ref 70–99)
POTASSIUM: 3.6 meq/L (ref 3.5–5.1)
Sodium: 140 mEq/L (ref 135–145)
Total Bilirubin: 0.7 mg/dL (ref 0.2–1.2)
Total Protein: 6.9 g/dL (ref 6.0–8.3)

## 2017-10-28 LAB — LIPID PANEL
CHOL/HDL RATIO: 4
Cholesterol: 246 mg/dL — ABNORMAL HIGH (ref 0–200)
HDL: 56.5 mg/dL (ref >39.00)
LDL Cholesterol: 174 mg/dL — ABNORMAL HIGH (ref 0–99)
NONHDL: 189.06
Triglycerides: 73 mg/dL (ref 0.0–149.0)
VLDL: 14.6 mg/dL (ref 0.0–40.0)

## 2017-11-02 ENCOUNTER — Other Ambulatory Visit: Payer: Self-pay | Admitting: Internal Medicine

## 2017-11-02 DIAGNOSIS — M169 Osteoarthritis of hip, unspecified: Secondary | ICD-10-CM

## 2017-11-02 DIAGNOSIS — M179 Osteoarthritis of knee, unspecified: Secondary | ICD-10-CM

## 2017-11-02 DIAGNOSIS — M1710 Unilateral primary osteoarthritis, unspecified knee: Secondary | ICD-10-CM

## 2017-11-10 ENCOUNTER — Ambulatory Visit (INDEPENDENT_AMBULATORY_CARE_PROVIDER_SITE_OTHER): Payer: Federal, State, Local not specified - PPO | Admitting: Psychology

## 2017-11-10 ENCOUNTER — Ambulatory Visit
Admission: RE | Admit: 2017-11-10 | Discharge: 2017-11-10 | Disposition: A | Discharge status: Home / Self Care | Payer: No Typology Code available for payment source | Source: Ambulatory Visit | Attending: Internal Medicine | Admitting: Internal Medicine

## 2017-11-10 ENCOUNTER — Ambulatory Visit
Admission: RE | Admit: 2017-11-10 | Discharge: 2017-11-10 | Disposition: A | Discharge status: Home / Self Care | Payer: Federal, State, Local not specified - PPO | Source: Ambulatory Visit | Attending: Internal Medicine | Admitting: Internal Medicine

## 2017-11-10 DIAGNOSIS — M1710 Unilateral primary osteoarthritis, unspecified knee: Secondary | ICD-10-CM

## 2017-11-10 DIAGNOSIS — F4323 Adjustment disorder with mixed anxiety and depressed mood: Secondary | ICD-10-CM

## 2017-11-10 DIAGNOSIS — M179 Osteoarthritis of knee, unspecified: Secondary | ICD-10-CM

## 2017-11-10 DIAGNOSIS — M169 Osteoarthritis of hip, unspecified: Secondary | ICD-10-CM

## 2017-11-10 DIAGNOSIS — F4310 Post-traumatic stress disorder, unspecified: Secondary | ICD-10-CM | POA: Diagnosis not present

## 2017-12-16 ENCOUNTER — Ambulatory Visit: Payer: Federal, State, Local not specified - PPO | Admitting: Psychology

## 2017-12-16 DIAGNOSIS — F4323 Adjustment disorder with mixed anxiety and depressed mood: Secondary | ICD-10-CM

## 2017-12-16 DIAGNOSIS — F4310 Post-traumatic stress disorder, unspecified: Secondary | ICD-10-CM

## 2017-12-18 ENCOUNTER — Encounter: Payer: Self-pay | Admitting: Family Medicine

## 2017-12-18 ENCOUNTER — Ambulatory Visit: Payer: Federal, State, Local not specified - PPO | Admitting: Family Medicine

## 2017-12-18 VITALS — BP 126/80 | HR 81 | Temp 98.4°F | Ht 68.0 in | Wt 215.0 lb

## 2017-12-18 DIAGNOSIS — T753XXA Motion sickness, initial encounter: Secondary | ICD-10-CM | POA: Diagnosis not present

## 2017-12-18 DIAGNOSIS — R350 Frequency of micturition: Secondary | ICD-10-CM | POA: Insufficient documentation

## 2017-12-18 HISTORY — DX: Frequency of micturition: R35.0

## 2017-12-18 LAB — POCT URINALYSIS DIPSTICK
Bilirubin, UA: NEGATIVE
GLUCOSE UA: NEGATIVE
Ketones, UA: NEGATIVE
LEUKOCYTES UA: NEGATIVE
Nitrite, UA: NEGATIVE
Protein, UA: NEGATIVE
RBC UA: NEGATIVE
Spec Grav, UA: 1.015 (ref 1.010–1.025)
Urobilinogen, UA: 0.2 E.U./dL
pH, UA: 6 (ref 5.0–8.0)

## 2017-12-18 MED ORDER — SCOPOLAMINE 1 MG/3DAYS TD PT72: 1.0000 | MEDICATED_PATCH | TRANSDERMAL | 0 refills | Status: DC

## 2017-12-18 MED ORDER — SOLIFENACIN SUCCINATE 5 MG PO TABS
5.0000 mg | ORAL_TABLET | Freq: Every day | ORAL | 2 refills | Status: DC
Start: 2017-12-18 — End: 2020-09-17

## 2017-12-18 MED ORDER — CIPROFLOXACIN HCL 250 MG PO TABS: 250.0000 mg | ORAL_TABLET | Freq: Two times a day (BID) | ORAL | 0 refills | Status: DC
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
## 2017-12-18 NOTE — Assessment & Plan Note (Signed)
vesicare 5 mg
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
## 2017-12-18 NOTE — Patient Instructions (Signed)

## 2017-12-20 LAB — CULTURE, URINE COMPREHENSIVE
MICRO NUMBER: 90046717
RESULT:: NO GROWTH
SPECIMEN QUALITY:: ADEQUATE

## 2017-12-21 ENCOUNTER — Other Ambulatory Visit: Payer: Self-pay

## 2017-12-29 ENCOUNTER — Encounter: Payer: Self-pay | Admitting: Medical

## 2017-12-30 ENCOUNTER — Telehealth: Payer: Self-pay | Admitting: Medical

## 2017-12-30 DIAGNOSIS — E785 Hyperlipidemia, unspecified: Principal | ICD-10-CM

## 2017-12-30 DIAGNOSIS — E1169 Type 2 diabetes mellitus with other specified complication: Secondary | ICD-10-CM

## 2017-12-30 NOTE — Telephone Encounter (Signed)
Future lipid panel and metabolic panel placed.

## 2018-01-07 ENCOUNTER — Other Ambulatory Visit (INDEPENDENT_AMBULATORY_CARE_PROVIDER_SITE_OTHER): Payer: Federal, State, Local not specified - PPO

## 2018-01-07 DIAGNOSIS — E785 Hyperlipidemia, unspecified: Secondary | ICD-10-CM

## 2018-01-07 DIAGNOSIS — E1169 Type 2 diabetes mellitus with other specified complication: Secondary | ICD-10-CM | POA: Diagnosis not present

## 2018-01-07 LAB — LIPID PANEL
CHOL/HDL RATIO: 4
CHOLESTEROL: 194 mg/dL (ref 0–200)
HDL: 51.4 mg/dL (ref >39.00)
LDL CALC: 126 mg/dL — AB (ref 0–99)
NonHDL: 142.82
TRIGLYCERIDES: 82 mg/dL (ref 0.0–149.0)
VLDL: 16.4 mg/dL (ref 0.0–40.0)

## 2018-01-07 LAB — COMPREHENSIVE METABOLIC PANEL
ALBUMIN: 3.9 g/dL (ref 3.5–5.2)
ALT: 11 U/L (ref 0–35)
AST: 17 U/L (ref 0–37)
Alkaline Phosphatase: 81 U/L (ref 39–117)
BILIRUBIN TOTAL: 0.9 mg/dL (ref 0.2–1.2)
BUN: 11 mg/dL (ref 6–23)
CO2: 31 meq/L (ref 19–32)
CREATININE: 0.71 mg/dL (ref 0.40–1.20)
Calcium: 9.2 mg/dL (ref 8.4–10.5)
Chloride: 106 mEq/L (ref 96–112)
GFR: 106.45 mL/min (ref >60.00)
Glucose, Bld: 97 mg/dL (ref 70–99)
Potassium: 3.7 mEq/L (ref 3.5–5.1)
SODIUM: 143 meq/L (ref 135–145)
Total Protein: 7.1 g/dL (ref 6.0–8.3)

## 2018-01-12 ENCOUNTER — Ambulatory Visit (INDEPENDENT_AMBULATORY_CARE_PROVIDER_SITE_OTHER): Payer: Federal, State, Local not specified - PPO | Admitting: Psychology

## 2018-01-12 DIAGNOSIS — F4310 Post-traumatic stress disorder, unspecified: Secondary | ICD-10-CM

## 2018-01-12 DIAGNOSIS — F4323 Adjustment disorder with mixed anxiety and depressed mood: Secondary | ICD-10-CM

## 2018-02-09 ENCOUNTER — Ambulatory Visit: Payer: Federal, State, Local not specified - PPO | Admitting: Psychology

## 2018-03-09 ENCOUNTER — Ambulatory Visit (INDEPENDENT_AMBULATORY_CARE_PROVIDER_SITE_OTHER): Payer: Federal, State, Local not specified - PPO | Admitting: Psychology

## 2018-03-09 DIAGNOSIS — F4323 Adjustment disorder with mixed anxiety and depressed mood: Secondary | ICD-10-CM | POA: Diagnosis not present

## 2018-03-09 DIAGNOSIS — F4310 Post-traumatic stress disorder, unspecified: Secondary | ICD-10-CM | POA: Diagnosis not present

## 2018-03-11 ENCOUNTER — Encounter (HOSPITAL_COMMUNITY): Payer: Self-pay

## 2018-03-21 ENCOUNTER — Other Ambulatory Visit: Payer: Self-pay | Admitting: Medical

## 2018-04-20 ENCOUNTER — Other Ambulatory Visit: Payer: Self-pay | Admitting: Medical

## 2018-05-06 ENCOUNTER — Ambulatory Visit: Payer: Federal, State, Local not specified - PPO | Admitting: Psychology

## 2018-05-06 DIAGNOSIS — F4310 Post-traumatic stress disorder, unspecified: Secondary | ICD-10-CM | POA: Diagnosis not present

## 2018-05-06 DIAGNOSIS — F4323 Adjustment disorder with mixed anxiety and depressed mood: Secondary | ICD-10-CM | POA: Diagnosis not present

## 2018-06-04 ENCOUNTER — Other Ambulatory Visit: Payer: Self-pay | Admitting: Family Medicine

## 2018-06-04 DIAGNOSIS — Z1231 Encounter for screening mammogram for malignant neoplasm of breast: Secondary | ICD-10-CM

## 2018-06-07 ENCOUNTER — Ambulatory Visit (HOSPITAL_BASED_OUTPATIENT_CLINIC_OR_DEPARTMENT_OTHER)
Admission: RE | Admit: 2018-06-07 | Discharge: 2018-06-07 | Disposition: A | Discharge status: Home / Self Care | Payer: Federal, State, Local not specified - PPO | Source: Ambulatory Visit | Attending: Family Medicine | Admitting: Family Medicine

## 2018-06-07 DIAGNOSIS — Z1231 Encounter for screening mammogram for malignant neoplasm of breast: Secondary | ICD-10-CM | POA: Diagnosis not present

## 2018-08-17 ENCOUNTER — Other Ambulatory Visit: Payer: Self-pay | Admitting: Medical

## 2018-09-09 ENCOUNTER — Ambulatory Visit (HOSPITAL_BASED_OUTPATIENT_CLINIC_OR_DEPARTMENT_OTHER)
Admission: RE | Admit: 2018-09-09 | Discharge: 2018-09-09 | Disposition: A | Discharge status: Home / Self Care | Payer: Medicare Other | Source: Ambulatory Visit | Attending: Medical | Admitting: Medical

## 2018-09-09 ENCOUNTER — Ambulatory Visit: Payer: Medicare Other | Admitting: Medical

## 2018-09-09 ENCOUNTER — Encounter: Payer: Self-pay | Admitting: Medical

## 2018-09-09 VITALS — BP 130/70 | HR 74 | Temp 98.4°F | Resp 16 | Ht 68.0 in | Wt 219.8 lb

## 2018-09-09 DIAGNOSIS — Z87898 Personal history of other specified conditions: Secondary | ICD-10-CM

## 2018-09-09 DIAGNOSIS — Z7722 Contact with and (suspected) exposure to environmental tobacco smoke (acute) (chronic): Secondary | ICD-10-CM | POA: Diagnosis not present

## 2018-09-09 DIAGNOSIS — R531 Weakness: Secondary | ICD-10-CM | POA: Diagnosis not present

## 2018-09-09 DIAGNOSIS — R102 Pelvic and perineal pain: Secondary | ICD-10-CM

## 2018-09-09 DIAGNOSIS — R61 Generalized hyperhidrosis: Secondary | ICD-10-CM | POA: Diagnosis not present

## 2018-09-09 DIAGNOSIS — R739 Hyperglycemia, unspecified: Secondary | ICD-10-CM | POA: Diagnosis not present

## 2018-09-09 DIAGNOSIS — R5383 Other fatigue: Secondary | ICD-10-CM

## 2018-09-09 LAB — CBC WITH DIFFERENTIAL/PLATELET
Basophils Absolute: 0 10*3/uL (ref 0.0–0.1)
Basophils Relative: 0.5 % (ref 0.0–3.0)
EOS ABS: 0.1 10*3/uL (ref 0.0–0.7)
Eosinophils Relative: 1.2 % (ref 0.0–5.0)
HEMATOCRIT: 36.1 % (ref 36.0–46.0)
Hemoglobin: 11.6 g/dL — ABNORMAL LOW (ref 12.0–15.0)
LYMPHS ABS: 2.5 10*3/uL (ref 0.7–4.0)
LYMPHS PCT: 31.3 % (ref 12.0–46.0)
MCHC: 32.2 g/dL (ref 30.0–36.0)
MCV: 82.3 fl (ref 78.0–100.0)
Monocytes Absolute: 0.5 10*3/uL (ref 0.1–1.0)
Monocytes Relative: 5.7 % (ref 3.0–12.0)
NEUTROS ABS: 4.8 10*3/uL (ref 1.4–7.7)
Neutrophils Relative %: 61.3 % (ref 43.0–77.0)
PLATELETS: 312 10*3/uL (ref 150.0–400.0)
RBC: 4.39 Mil/uL (ref 3.87–5.11)
RDW: 14.5 % (ref 11.5–15.5)
WBC: 7.9 10*3/uL (ref 4.0–10.5)

## 2018-09-09 LAB — POC URINALSYSI DIPSTICK (AUTOMATED)
Bilirubin, UA: NEGATIVE
GLUCOSE UA: NEGATIVE
Ketones, UA: NEGATIVE
LEUKOCYTES UA: NEGATIVE
NITRITE UA: NEGATIVE
Protein, UA: NEGATIVE
Spec Grav, UA: 1.025 (ref 1.010–1.025)
Urobilinogen, UA: NEGATIVE E.U./dL — AB
pH, UA: 6 (ref 5.0–8.0)

## 2018-09-09 LAB — COMPREHENSIVE METABOLIC PANEL
ALT: 9 U/L (ref 0–35)
AST: 15 U/L (ref 0–37)
Albumin: 4 g/dL (ref 3.5–5.2)
Alkaline Phosphatase: 95 U/L (ref 39–117)
BUN: 16 mg/dL (ref 6–23)
CALCIUM: 9.3 mg/dL (ref 8.4–10.5)
CHLORIDE: 105 meq/L (ref 96–112)
CO2: 32 meq/L (ref 19–32)
CREATININE: 0.8 mg/dL (ref 0.40–1.20)
GFR: 92.56 mL/min (ref >60.00)
Glucose, Bld: 87 mg/dL (ref 70–99)
Potassium: 4.1 mEq/L (ref 3.5–5.1)
Sodium: 140 mEq/L (ref 135–145)
Total Bilirubin: 0.8 mg/dL (ref 0.2–1.2)
Total Protein: 7 g/dL (ref 6.0–8.3)

## 2018-09-09 LAB — VITAMIN B12: VITAMIN B 12: 243 pg/mL (ref 211–911)

## 2018-09-09 LAB — VITAMIN D 25 HYDROXY (VIT D DEFICIENCY, FRACTURES): VITD: 28.24 ng/mL — AB (ref 30.00–100.00)

## 2018-09-09 LAB — T4, FREE: Free T4: 0.82 ng/dL (ref 0.60–1.60)

## 2018-09-09 LAB — IRON: IRON: 58 ug/dL (ref 42–145)

## 2018-09-09 LAB — HEMOGLOBIN A1C: Hgb A1c MFr Bld: 5.6 % (ref 4.6–6.5)

## 2018-09-09 LAB — TSH: TSH: 0.87 u[IU]/mL (ref 0.35–4.50)

## 2018-09-09 NOTE — Patient Instructions (Addendum)
For history of fatigue, prior bariatric surgery, slight elevated sugar level, and night sweats, I ordered CBC, CMP, TSH, T4, B12, B1, vitamin D, iron level, and A1c.  In addition decided to get chest x-ray.  I do want you to double check on when you last had your colonoscopy.  Also if you could get [place] a copy of that report so we can scan it to media.  Urine check to see if there are indicators of infection.  Decided to go ahead and do culture based on minimal suprapubic tenderness.  Still recommend with night sweats occasional check temp to verify if not fever. If fever occurs would then expand work up.  Follow up 2 weeks or as needed
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
## 2018-09-10 LAB — URINE CULTURE
MICRO NUMBER:: 91189918
SPECIMEN QUALITY: ADEQUATE

## 2018-09-12 LAB — VITAMIN B1: VITAMIN B1 (THIAMINE): 33 nmol/L — AB (ref 8–30)

## 2018-09-16 ENCOUNTER — Other Ambulatory Visit: Payer: Self-pay | Admitting: Medical

## 2018-09-16 DIAGNOSIS — K219 Gastro-esophageal reflux disease without esophagitis: Secondary | ICD-10-CM

## 2018-09-16 DIAGNOSIS — G4733 Obstructive sleep apnea (adult) (pediatric): Secondary | ICD-10-CM

## 2018-09-16 DIAGNOSIS — I10 Essential (primary) hypertension: Secondary | ICD-10-CM

## 2018-09-23 ENCOUNTER — Ambulatory Visit: Payer: Self-pay | Admitting: Medical

## 2018-09-27 DIAGNOSIS — H25013 Cortical age-related cataract, bilateral: Secondary | ICD-10-CM | POA: Diagnosis not present

## 2018-10-17 ENCOUNTER — Other Ambulatory Visit: Payer: Self-pay | Admitting: Medical

## 2018-10-17 DIAGNOSIS — I10 Essential (primary) hypertension: Secondary | ICD-10-CM

## 2018-10-17 DIAGNOSIS — K219 Gastro-esophageal reflux disease without esophagitis: Secondary | ICD-10-CM

## 2018-10-22 ENCOUNTER — Other Ambulatory Visit: Payer: Self-pay | Admitting: Medical

## 2018-11-08 DIAGNOSIS — Z01818 Encounter for other preprocedural examination: Secondary | ICD-10-CM | POA: Diagnosis not present

## 2018-11-08 DIAGNOSIS — H25812 Combined forms of age-related cataract, left eye: Secondary | ICD-10-CM | POA: Diagnosis not present

## 2018-11-08 DIAGNOSIS — H25811 Combined forms of age-related cataract, right eye: Secondary | ICD-10-CM | POA: Diagnosis not present

## 2018-11-08 DIAGNOSIS — H43812 Vitreous degeneration, left eye: Secondary | ICD-10-CM | POA: Diagnosis not present

## 2018-11-16 ENCOUNTER — Other Ambulatory Visit: Payer: Self-pay | Admitting: Medical

## 2018-11-16 DIAGNOSIS — I10 Essential (primary) hypertension: Secondary | ICD-10-CM

## 2018-11-16 DIAGNOSIS — K219 Gastro-esophageal reflux disease without esophagitis: Secondary | ICD-10-CM

## 2018-11-22 DIAGNOSIS — H2511 Age-related nuclear cataract, right eye: Secondary | ICD-10-CM | POA: Diagnosis not present

## 2018-11-22 DIAGNOSIS — H25811 Combined forms of age-related cataract, right eye: Secondary | ICD-10-CM | POA: Diagnosis not present

## 2018-11-29 ENCOUNTER — Telehealth: Payer: Self-pay

## 2018-11-29 NOTE — Telephone Encounter (Signed)
I talked with pt. 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 No chronic immunosupression meds or [place] of immuosuppresive meds.  She should be aware it is a live vaccine.(traveling to [place])  I am typing letter. Will you fax it to number below and call them letting them know fax being sent over.   fax number [phone].(passport health).  Phone number- [phone].

## 2018-11-29 NOTE — Telephone Encounter (Signed)
Faxed letter to [phone]

## 2018-11-29 NOTE — Telephone Encounter (Signed)
Copied from [place] [id number]. Topic: General - Other >> Nov 29, 2018  8:08 AM Ivar Drape wrote: Reason for CRM:    Patient is leaving out of the country and she needs a note today stating she is okay to receive a Yellow Fever Vaccine.  Please advise.

## 2018-12-08 DIAGNOSIS — H269 Unspecified cataract: Secondary | ICD-10-CM | POA: Insufficient documentation

## 2018-12-08 HISTORY — DX: Unspecified cataract: H26.9

## 2018-12-15 ENCOUNTER — Ambulatory Visit (HOSPITAL_BASED_OUTPATIENT_CLINIC_OR_DEPARTMENT_OTHER)
Admission: RE | Admit: 2018-12-15 | Discharge: 2018-12-15 | Disposition: A | Discharge status: Home / Self Care | Payer: Medicare Other | Source: Ambulatory Visit | Attending: Medical | Admitting: Medical

## 2018-12-15 ENCOUNTER — Ambulatory Visit (INDEPENDENT_AMBULATORY_CARE_PROVIDER_SITE_OTHER): Payer: Medicare Other | Admitting: Medical

## 2018-12-15 ENCOUNTER — Encounter: Payer: Self-pay | Admitting: Medical

## 2018-12-15 VITALS — BP 130/80 | HR 70 | Temp 98.6°F | Resp 16 | Ht 68.0 in | Wt 228.2 lb

## 2018-12-15 DIAGNOSIS — H81399 Other peripheral vertigo, unspecified ear: Secondary | ICD-10-CM

## 2018-12-15 DIAGNOSIS — R270 Ataxia, unspecified: Secondary | ICD-10-CM | POA: Diagnosis not present

## 2018-12-15 DIAGNOSIS — G3281 Cerebellar ataxia in diseases classified elsewhere: Secondary | ICD-10-CM | POA: Insufficient documentation

## 2018-12-15 DIAGNOSIS — R42 Dizziness and giddiness: Secondary | ICD-10-CM

## 2018-12-15 LAB — CBC WITH DIFFERENTIAL/PLATELET
BASOS PCT: 0.3 % (ref 0.0–3.0)
Basophils Absolute: 0 10*3/uL (ref 0.0–0.1)
EOS PCT: 1.6 % (ref 0.0–5.0)
Eosinophils Absolute: 0.1 10*3/uL (ref 0.0–0.7)
HCT: 37.3 % (ref 36.0–46.0)
Hemoglobin: 12.1 g/dL (ref 12.0–15.0)
LYMPHS ABS: 1.1 10*3/uL (ref 0.7–4.0)
Lymphocytes Relative: 19.5 % (ref 12.0–46.0)
MCHC: 32.3 g/dL (ref 30.0–36.0)
MCV: 81.5 fl (ref 78.0–100.0)
MONO ABS: 0.4 10*3/uL (ref 0.1–1.0)
MONOS PCT: 6.8 % (ref 3.0–12.0)
NEUTROS ABS: 4 10*3/uL (ref 1.4–7.7)
NEUTROS PCT: 71.8 % (ref 43.0–77.0)
Platelets: 324 10*3/uL (ref 150.0–400.0)
RBC: 4.58 Mil/uL (ref 3.87–5.11)
RDW: 14.1 % (ref 11.5–15.5)
WBC: 5.6 10*3/uL (ref 4.0–10.5)

## 2018-12-15 LAB — COMPREHENSIVE METABOLIC PANEL
ALBUMIN: 3.9 g/dL (ref 3.5–5.2)
ALK PHOS: 89 U/L (ref 39–117)
ALT: 7 U/L (ref 0–35)
AST: 15 U/L (ref 0–37)
BUN: 19 mg/dL (ref 6–23)
CHLORIDE: 104 meq/L (ref 96–112)
CO2: 30 meq/L (ref 19–32)
Calcium: 9.2 mg/dL (ref 8.4–10.5)
Creatinine, Ser: 0.84 mg/dL (ref 0.40–1.20)
GFR: 87.42 mL/min (ref >60.00)
GLUCOSE: 89 mg/dL (ref 70–99)
POTASSIUM: 4.5 meq/L (ref 3.5–5.1)
Sodium: 139 mEq/L (ref 135–145)
TOTAL PROTEIN: 6.7 g/dL (ref 6.0–8.3)
Total Bilirubin: 1.2 mg/dL (ref 0.2–1.2)

## 2018-12-15 MED ORDER — MECLIZINE HCL 12.5 MG PO TABS: 12.5000 mg | ORAL_TABLET | Freq: Three times a day (TID) | ORAL | 0 refills | Status: DC | PRN
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
## 2018-12-15 NOTE — Patient Instructions (Signed)
You still have some mild dizziness/vertigo presently.  You do have overall good neurologic exam.  No obvious deficits.  With your described history I do think is best to go ahead and get an EKG, CBC, CMP and CT of head without contrast.  Also decide to get A1c as you reported as 6.3 A1c in [place].  Will prescribe meclizine.  We will see how you respond to meclizine.  Make sure you stay well-hydrated.  I do think is a good idea to check your blood pressure daily to see if you are getting high or low readings.  You have any cardiac or neurologic signs symptoms with dizziness then recommend ED evaluation.  Follow-up this coming Monday or Tuesday for recheck.

## 2018-12-16 ENCOUNTER — Other Ambulatory Visit: Payer: Self-pay | Admitting: Medical

## 2018-12-16 DIAGNOSIS — I10 Essential (primary) hypertension: Secondary | ICD-10-CM

## 2018-12-16 DIAGNOSIS — K219 Gastro-esophageal reflux disease without esophagitis: Secondary | ICD-10-CM

## 2018-12-22 ENCOUNTER — Encounter: Payer: Self-pay | Admitting: Family Medicine

## 2018-12-22 ENCOUNTER — Ambulatory Visit (INDEPENDENT_AMBULATORY_CARE_PROVIDER_SITE_OTHER): Payer: Medicare Other | Admitting: Family Medicine

## 2018-12-22 VITALS — BP 118/76 | HR 88 | Temp 98.5°F | Resp 16 | Ht 68.0 in | Wt 225.0 lb

## 2018-12-22 DIAGNOSIS — H8112 Benign paroxysmal vertigo, left ear: Secondary | ICD-10-CM

## 2018-12-22 DIAGNOSIS — R42 Dizziness and giddiness: Secondary | ICD-10-CM | POA: Diagnosis not present
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
## 2018-12-22 NOTE — Patient Instructions (Signed)
It was good to see you today, but I am sorry you are having this dizziness. It does appear that you likely have BPPV. You can use the meclizine as needed, but if not helpful certainly do not have to take it.  Use caution with driving, and be careful to avoid falls. I am going to refer to physical therapy here in our building, they can help you with some maneuvers to reduce dizziness. If any worsening, or changes of your symptoms please let me know

## 2018-12-24 DIAGNOSIS — Z0279 Encounter for issue of other medical certificate: Secondary | ICD-10-CM

## 2018-12-27 ENCOUNTER — Encounter: Payer: Self-pay | Admitting: Physical Therapy

## 2018-12-27 ENCOUNTER — Telehealth: Payer: Self-pay

## 2018-12-27 ENCOUNTER — Other Ambulatory Visit: Payer: Self-pay

## 2018-12-27 ENCOUNTER — Ambulatory Visit: Payer: Medicare Other | Attending: Family Medicine | Admitting: Physical Therapy

## 2018-12-27 DIAGNOSIS — R2681 Unsteadiness on feet: Secondary | ICD-10-CM

## 2018-12-27 DIAGNOSIS — R42 Dizziness and giddiness: Secondary | ICD-10-CM | POA: Diagnosis not present
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
## 2018-12-27 NOTE — Telephone Encounter (Signed)
PA initiated via Covermymeds; KEY: [id number]. PA approved.

## 2018-12-27 NOTE — Telephone Encounter (Signed)
Copied from [facility]. Topic: General - Other >> Dec 24, 2018  6:02 PM Yvette Rack wrote: Reason for CRM: Pt stated that the pharmacy informed her that her insurance will only cover 90 pills within 365 days. Spoke with [employer] and was told that pt insurance requests the doctor to contact them at [phone] for approval to refill the pantoprazole (PROTONIX) 40 MG tablet because they will only approve 90 day supply within 365 days.

## 2019-01-03 ENCOUNTER — Encounter: Payer: Self-pay | Admitting: Physical Therapy

## 2019-01-03 ENCOUNTER — Ambulatory Visit: Payer: Medicare Other | Admitting: Physical Therapy

## 2019-01-03 DIAGNOSIS — R42 Dizziness and giddiness: Secondary | ICD-10-CM | POA: Diagnosis not present

## 2019-01-03 DIAGNOSIS — R2681 Unsteadiness on feet: Secondary | ICD-10-CM
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
## 2019-01-07 ENCOUNTER — Ambulatory Visit: Payer: Medicare Other | Admitting: Physical Therapy

## 2019-01-11 ENCOUNTER — Encounter: Payer: Self-pay | Admitting: Physical Therapy

## 2019-01-11 ENCOUNTER — Ambulatory Visit: Payer: Medicare Other | Attending: Family Medicine | Admitting: Physical Therapy

## 2019-01-11 DIAGNOSIS — R42 Dizziness and giddiness: Secondary | ICD-10-CM | POA: Diagnosis not present

## 2019-01-11 DIAGNOSIS — R2681 Unsteadiness on feet: Secondary | ICD-10-CM
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
## 2019-01-13 ENCOUNTER — Other Ambulatory Visit: Payer: Self-pay

## 2019-01-13 ENCOUNTER — Encounter (HOSPITAL_BASED_OUTPATIENT_CLINIC_OR_DEPARTMENT_OTHER): Payer: Self-pay | Admitting: Emergency Medicine

## 2019-01-13 ENCOUNTER — Emergency Department (HOSPITAL_BASED_OUTPATIENT_CLINIC_OR_DEPARTMENT_OTHER)
Admission: EM | Admit: 2019-01-13 | Discharge: 2019-01-13 | Disposition: A | Discharge status: Home / Self Care | Payer: Medicare Other | Attending: Emergency Medicine | Admitting: Emergency Medicine

## 2019-01-13 ENCOUNTER — Emergency Department (HOSPITAL_BASED_OUTPATIENT_CLINIC_OR_DEPARTMENT_OTHER): Payer: Medicare Other

## 2019-01-13 DIAGNOSIS — R000 Tachycardia, unspecified: Secondary | ICD-10-CM | POA: Diagnosis not present

## 2019-01-13 DIAGNOSIS — I10 Essential (primary) hypertension: Secondary | ICD-10-CM | POA: Insufficient documentation

## 2019-01-13 DIAGNOSIS — R002 Palpitations: Secondary | ICD-10-CM

## 2019-01-13 LAB — COMPREHENSIVE METABOLIC PANEL
ALBUMIN: 3.9 g/dL (ref 3.5–5.0)
ALK PHOS: 81 U/L (ref 38–126)
ALT: 16 U/L (ref 0–44)
AST: 21 U/L (ref 15–41)
Anion gap: 6 (ref 5–15)
BILIRUBIN TOTAL: 0.9 mg/dL (ref 0.3–1.2)
BUN: 12 mg/dL (ref 8–23)
CO2: 27 mmol/L (ref 22–32)
CREATININE: 0.72 mg/dL (ref 0.44–1.00)
Calcium: 9.1 mg/dL (ref 8.9–10.3)
Chloride: 104 mmol/L (ref 98–111)
GFR calc Af Amer: >60 mL/min (ref >60)
GFR calc non Af Amer: >60 mL/min (ref >60)
GLUCOSE: 96 mg/dL (ref 70–99)
POTASSIUM: 3.7 mmol/L (ref 3.5–5.1)
Sodium: 137 mmol/L (ref 135–145)
Total Protein: 7.4 g/dL (ref 6.5–8.1)

## 2019-01-13 LAB — CBC WITH DIFFERENTIAL/PLATELET
ABS IMMATURE GRANULOCYTES: 0.01 10*3/uL (ref 0.00–0.07)
Basophils Absolute: 0 10*3/uL (ref 0.0–0.1)
Basophils Relative: 0 %
EOS ABS: 0 10*3/uL (ref 0.0–0.5)
Eosinophils Relative: 1 %
HEMATOCRIT: 37.9 % (ref 36.0–46.0)
Hemoglobin: 11.6 g/dL — ABNORMAL LOW (ref 12.0–15.0)
IMMATURE GRANULOCYTES: 0 %
LYMPHS ABS: 1.4 10*3/uL (ref 0.7–4.0)
Lymphocytes Relative: 27 %
MCH: 25.6 pg — AB (ref 26.0–34.0)
MCHC: 30.6 g/dL (ref 30.0–36.0)
MCV: 83.7 fL (ref 80.0–100.0)
MONO ABS: 0.3 10*3/uL (ref 0.1–1.0)
MONOS PCT: 6 %
NEUTROS ABS: 3.6 10*3/uL (ref 1.7–7.7)
NEUTROS PCT: 66 %
Platelets: 304 10*3/uL (ref 150–400)
RBC: 4.53 MIL/uL (ref 3.87–5.11)
RDW: 13.6 % (ref 11.5–15.5)
WBC: 5.4 10*3/uL (ref 4.0–10.5)
nRBC: 0 % (ref 0.0–0.2)

## 2019-01-13 LAB — TROPONIN I: Troponin I: <0.03 ng/mL (ref <0.03)

## 2019-01-13 MED ORDER — ACETAMINOPHEN 500 MG PO TABS
1000.0000 mg | ORAL_TABLET | Freq: Once | ORAL | Status: AC
  Administered 2019-01-13: 1000 mg via ORAL
  Filled 2019-01-13: qty 2

## 2019-01-13 MED ORDER — SODIUM CHLORIDE 0.9 % IV BOLUS
500.0000 mL | Freq: Once | INTRAVENOUS | Status: AC
  Administered 2019-01-13: 500 mL via INTRAVENOUS

## 2019-01-13 NOTE — ED Notes (Signed)
ED Provider at bedside. 
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
## 2019-01-13 NOTE — ED Triage Notes (Signed)
Pt c/o heart racing this am. States HR got up to 140. Also endorses diaphoresis and headache. Denies chest pain.

## 2019-01-13 NOTE — Discharge Instructions (Signed)
You were seen in the ED today with rapid heartbeat. Your labs and chest x-ray is normal. You need to return to the ED immediately with any chest pain, trouble breathing, fever, chills, or other severe symptoms. Call the PCP and Cardiologist listed to schedule the next available follow up appointments.

## 2019-01-13 NOTE — ED Notes (Signed)
Pt enrolled in aromatherapy pain trial 

## 2019-01-15 ENCOUNTER — Other Ambulatory Visit: Payer: Self-pay | Admitting: Medical

## 2019-01-19 ENCOUNTER — Ambulatory Visit: Payer: Medicare Other | Admitting: Physical Therapy

## 2019-01-21 DIAGNOSIS — H2512 Age-related nuclear cataract, left eye: Secondary | ICD-10-CM | POA: Diagnosis not present

## 2019-01-21 DIAGNOSIS — H25812 Combined forms of age-related cataract, left eye: Secondary | ICD-10-CM | POA: Diagnosis not present

## 2019-01-24 ENCOUNTER — Ambulatory Visit: Payer: Medicare Other | Admitting: Physical Therapy

## 2019-01-27 ENCOUNTER — Other Ambulatory Visit: Payer: Self-pay | Admitting: Medical

## 2019-01-27 DIAGNOSIS — I10 Essential (primary) hypertension: Secondary | ICD-10-CM

## 2019-01-31 ENCOUNTER — Other Ambulatory Visit: Payer: Self-pay | Admitting: Medical

## 2019-01-31 DIAGNOSIS — K219 Gastro-esophageal reflux disease without esophagitis: Secondary | ICD-10-CM

## 2019-02-17 ENCOUNTER — Encounter: Payer: Self-pay | Admitting: Medical

## 2019-02-18 ENCOUNTER — Other Ambulatory Visit: Payer: Self-pay

## 2019-02-18 ENCOUNTER — Ambulatory Visit (INDEPENDENT_AMBULATORY_CARE_PROVIDER_SITE_OTHER): Payer: Medicare Other | Admitting: Medical

## 2019-02-18 ENCOUNTER — Encounter: Payer: Self-pay | Admitting: Medical

## 2019-02-18 VITALS — BP 127/59 | HR 65 | Temp 98.1°F | Resp 16 | Ht 68.0 in | Wt 227.2 lb

## 2019-02-18 DIAGNOSIS — R102 Pelvic and perineal pain: Secondary | ICD-10-CM | POA: Diagnosis not present

## 2019-02-18 DIAGNOSIS — K219 Gastro-esophageal reflux disease without esophagitis: Secondary | ICD-10-CM

## 2019-02-18 DIAGNOSIS — G4700 Insomnia, unspecified: Secondary | ICD-10-CM | POA: Diagnosis not present

## 2019-02-18 LAB — POC URINALSYSI DIPSTICK (AUTOMATED)
Bilirubin, UA: NEGATIVE
Glucose, UA: NEGATIVE
Ketones, UA: NEGATIVE
Leukocytes, UA: NEGATIVE
Nitrite, UA: NEGATIVE
Protein, UA: NEGATIVE
Spec Grav, UA: 1.015 (ref 1.010–1.025)
Urobilinogen, UA: NEGATIVE E.U./dL — AB
pH, UA: 6 (ref 5.0–8.0)

## 2019-02-18 MED ORDER — FAMOTIDINE 20 MG PO TABS: ORAL_TABLET | ORAL | 2 refills | Status: DC

## 2019-02-18 MED ORDER — ESZOPICLONE 2 MG PO TABS: 2.0000 mg | ORAL_TABLET | Freq: Every evening | ORAL | 0 refills | Status: DC | PRN
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
## 2019-02-18 NOTE — Patient Instructions (Addendum)
You do describe worsening of reflux symptoms recently.  I want you to continue your Protonix once daily and add famotidine 1 to 2 tablets daily.  Prescription of famotidine was sent to your pharmacy.  If you would give me an update in about 4 to 5 days if this is helping.  For history of recent insomnia, I did prescribe Lunesta.  Sounds like the severe stress and anxiety over the past few months has resolved.  But if you start to feel anxious again please let me know.  You do describe probable cystocele.  You  have some mild suprapubic pressure to palpation.  You gave urine sample today.  We will get a culture if there is any indications of possible infection.  Otherwise keep appointment with gynecologist.  If you get any worsening genitourinary type symptoms in the interim let us know.  Follow-up in 1 month or as needed.

## 2019-02-19 ENCOUNTER — Telehealth: Payer: Self-pay | Admitting: Medical

## 2019-02-19 DIAGNOSIS — N814 Uterovaginal prolapse, unspecified: Secondary | ICD-10-CM

## 2019-02-19 LAB — URINE CULTURE
MICRO NUMBER:: 317129
SPECIMEN QUALITY:: ADEQUATE

## 2019-02-19 NOTE — Telephone Encounter (Signed)
Referral to gyn placed.

## 2019-02-22 ENCOUNTER — Other Ambulatory Visit: Payer: Self-pay | Admitting: Medical

## 2019-02-22 DIAGNOSIS — I10 Essential (primary) hypertension: Secondary | ICD-10-CM

## 2019-03-09 ENCOUNTER — Other Ambulatory Visit: Payer: Self-pay | Admitting: Medical

## 2019-03-09 DIAGNOSIS — R351 Nocturia: Secondary | ICD-10-CM | POA: Diagnosis not present

## 2019-03-09 DIAGNOSIS — N8189 Other female genital prolapse: Secondary | ICD-10-CM | POA: Diagnosis not present

## 2019-03-09 DIAGNOSIS — K219 Gastro-esophageal reflux disease without esophagitis: Secondary | ICD-10-CM

## 2019-03-09 DIAGNOSIS — N816 Rectocele: Secondary | ICD-10-CM | POA: Diagnosis not present

## 2019-03-09 DIAGNOSIS — N3281 Overactive bladder: Secondary | ICD-10-CM | POA: Diagnosis not present

## 2019-03-10 ENCOUNTER — Other Ambulatory Visit: Payer: Self-pay | Admitting: Medical

## 2019-03-10 DIAGNOSIS — K219 Gastro-esophageal reflux disease without esophagitis: Secondary | ICD-10-CM

## 2019-03-16 ENCOUNTER — Ambulatory Visit (INDEPENDENT_AMBULATORY_CARE_PROVIDER_SITE_OTHER): Payer: Medicare Other | Admitting: Medical

## 2019-03-16 ENCOUNTER — Other Ambulatory Visit: Payer: Self-pay

## 2019-03-16 ENCOUNTER — Encounter: Payer: Self-pay | Admitting: Medical

## 2019-03-16 DIAGNOSIS — R0781 Pleurodynia: Secondary | ICD-10-CM

## 2019-03-16 MED ORDER — CYCLOBENZAPRINE HCL 5 MG PO TABS: 5.0000 mg | ORAL_TABLET | Freq: Three times a day (TID) | ORAL | 0 refills | Status: DC | PRN

## 2019-03-16 MED ORDER — HYDROCODONE-ACETAMINOPHEN 5-325 MG PO TABS: 1.0000 | ORAL_TABLET | Freq: Four times a day (QID) | ORAL | 0 refills | Status: DC | PRN
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
## 2019-03-16 NOTE — Patient Instructions (Signed)
The pain  description and history appears to be consistant musculoskeletal type pain.  Possibly from the rib, muscle or cartilage.  Advised her to continue Celebrex 20 mg 1 tablet a day.  I did prescribe Flexeril 5 mg to use 1 tablet every 8 hours as needed for muscle spasms.  Rx advisement given.  For more severe level pain making Norco 5/325 available.  Rx advisement given for this as well.  Advised patient she could also try lidocaine/salon pas patch to the area.  If pain persists or worsens let us know.  Might consider referral to sports medicine but not sure if they are seen patients in the viral pandemic situation.  Asked patient to give me update on how she is doing in approximate 5 to 7 days or sooner if needed.

## 2019-03-25 ENCOUNTER — Telehealth: Payer: Self-pay | Admitting: Medical

## 2019-03-25 NOTE — Telephone Encounter (Signed)
Pt had a virtual visit on 4/8, pt is still having rib pain, pt wants to know what other options she has. Percell Miller mentioned a referral or pt would like know if a stronger medication can be called in. Please advise.

## 2019-03-28 ENCOUNTER — Telehealth: Payer: Self-pay | Admitting: Medical

## 2019-03-28 DIAGNOSIS — R0781 Pleurodynia: Secondary | ICD-10-CM

## 2019-03-28 NOTE — Telephone Encounter (Signed)
Will you let patient know that I placed referral to sports medicine and hopefully they will be able to see her this week.  Some specialist are seeing patients.

## 2019-03-28 NOTE — Telephone Encounter (Signed)
Pt.notified

## 2019-03-28 NOTE — Telephone Encounter (Signed)
Referral to sports medicine placed. Will you see if Dr. Barbaraann Barthel can see pt this week.

## 2019-03-30 ENCOUNTER — Ambulatory Visit (INDEPENDENT_AMBULATORY_CARE_PROVIDER_SITE_OTHER): Payer: Medicare Other | Admitting: Family Medicine

## 2019-03-30 ENCOUNTER — Encounter: Payer: Self-pay | Admitting: Family Medicine

## 2019-03-30 ENCOUNTER — Other Ambulatory Visit: Payer: Self-pay

## 2019-03-30 DIAGNOSIS — M9908 Segmental and somatic dysfunction of rib cage: Secondary | ICD-10-CM | POA: Insufficient documentation

## 2019-03-30 DIAGNOSIS — R0789 Other chest pain: Secondary | ICD-10-CM | POA: Insufficient documentation

## 2019-03-30 DIAGNOSIS — R0781 Pleurodynia: Secondary | ICD-10-CM

## 2019-03-30 HISTORY — DX: Segmental and somatic dysfunction of rib cage: M99.08

## 2019-03-30 HISTORY — DX: Pleurodynia: R07.81

## 2019-03-30 MED ORDER — NAPROXEN 500 MG PO TABS: 500.0000 mg | ORAL_TABLET | Freq: Two times a day (BID) | ORAL | 2 refills | Status: DC | PRN

## 2019-03-30 MED ORDER — METHOCARBAMOL 500 MG PO TABS: 500.0000 mg | ORAL_TABLET | Freq: Three times a day (TID) | ORAL | 1 refills | Status: DC | PRN
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
## 2019-03-30 NOTE — Assessment & Plan Note (Signed)
-   Pain likely related to somatic dysfunction exacerbated by overuse - recommend conservative treatment - Naproxen 500mg  bid prescribed, stop taking celebrex, counseled on s/e profile - Flexeril switched to Robaxin 500mg  TID PRN as a muscle relaxant, counseled on s/e profile - Compression with a rib belt recommended with activity - May trial topical lidoderm patches vs capsaicin cream if desired - Serratus anterior and latissmus dorsi stretches given to be completed daily - strengthening exercises to be completed once pain has improved - f/u in 4-6 wks if not improving

## 2019-03-30 NOTE — Assessment & Plan Note (Signed)
-   may benefit from chiropractic or osteopathic manipulation if ongoing pain, no red flag symptoms today - heating pad and activity modification encouraged - NSAIDs, stretches, muscle relaxants as previously described
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
## 2019-04-20 ENCOUNTER — Other Ambulatory Visit: Payer: Self-pay | Admitting: Medical

## 2019-04-27 ENCOUNTER — Ambulatory Visit: Payer: Self-pay | Admitting: Family Medicine

## 2019-06-13 DIAGNOSIS — N3281 Overactive bladder: Secondary | ICD-10-CM | POA: Diagnosis not present

## 2019-06-13 DIAGNOSIS — N393 Stress incontinence (female) (male): Secondary | ICD-10-CM | POA: Diagnosis not present

## 2019-06-15 DIAGNOSIS — N9419 Other specified dyspareunia: Secondary | ICD-10-CM | POA: Diagnosis not present

## 2019-06-15 DIAGNOSIS — N393 Stress incontinence (female) (male): Secondary | ICD-10-CM | POA: Diagnosis not present

## 2019-06-15 DIAGNOSIS — N8111 Cystocele, midline: Secondary | ICD-10-CM | POA: Diagnosis not present

## 2019-06-15 DIAGNOSIS — N819 Female genital prolapse, unspecified: Secondary | ICD-10-CM | POA: Diagnosis not present

## 2019-06-27 ENCOUNTER — Other Ambulatory Visit: Payer: Self-pay | Admitting: Medical

## 2019-06-27 DIAGNOSIS — N819 Female genital prolapse, unspecified: Secondary | ICD-10-CM | POA: Diagnosis not present

## 2019-06-27 DIAGNOSIS — Z1159 Encounter for screening for other viral diseases: Secondary | ICD-10-CM | POA: Diagnosis not present

## 2019-06-27 DIAGNOSIS — N816 Rectocele: Secondary | ICD-10-CM | POA: Diagnosis not present

## 2019-06-27 DIAGNOSIS — Z01812 Encounter for preprocedural laboratory examination: Secondary | ICD-10-CM | POA: Diagnosis not present

## 2019-06-27 DIAGNOSIS — N8189 Other female genital prolapse: Secondary | ICD-10-CM | POA: Diagnosis not present

## 2019-07-04 DIAGNOSIS — R351 Nocturia: Secondary | ICD-10-CM | POA: Diagnosis not present

## 2019-07-04 DIAGNOSIS — N941 Unspecified dyspareunia: Secondary | ICD-10-CM | POA: Diagnosis not present

## 2019-07-04 DIAGNOSIS — N9419 Other specified dyspareunia: Secondary | ICD-10-CM | POA: Diagnosis not present

## 2019-07-04 DIAGNOSIS — N3281 Overactive bladder: Secondary | ICD-10-CM | POA: Diagnosis not present

## 2019-07-04 DIAGNOSIS — N393 Stress incontinence (female) (male): Secondary | ICD-10-CM | POA: Diagnosis not present

## 2019-07-04 DIAGNOSIS — N816 Rectocele: Secondary | ICD-10-CM | POA: Diagnosis not present

## 2019-07-04 DIAGNOSIS — N8110 Cystocele, unspecified: Secondary | ICD-10-CM | POA: Diagnosis not present

## 2019-07-04 DIAGNOSIS — N819 Female genital prolapse, unspecified: Secondary | ICD-10-CM

## 2019-07-04 DIAGNOSIS — N8189 Other female genital prolapse: Secondary | ICD-10-CM | POA: Diagnosis not present

## 2019-07-04 DIAGNOSIS — N993 Prolapse of vaginal vault after hysterectomy: Secondary | ICD-10-CM | POA: Diagnosis not present

## 2019-07-04 DIAGNOSIS — N812 Incomplete uterovaginal prolapse: Secondary | ICD-10-CM | POA: Diagnosis not present

## 2019-07-04 DIAGNOSIS — N3642 Intrinsic sphincter deficiency (ISD): Secondary | ICD-10-CM | POA: Diagnosis not present

## 2019-07-04 HISTORY — DX: Female genital prolapse, unspecified: N81.9

## 2019-07-05 DIAGNOSIS — N8189 Other female genital prolapse: Secondary | ICD-10-CM | POA: Diagnosis not present

## 2019-07-05 DIAGNOSIS — N941 Unspecified dyspareunia: Secondary | ICD-10-CM | POA: Diagnosis not present

## 2019-07-05 DIAGNOSIS — N393 Stress incontinence (female) (male): Secondary | ICD-10-CM | POA: Diagnosis not present

## 2019-07-05 DIAGNOSIS — N3281 Overactive bladder: Secondary | ICD-10-CM | POA: Diagnosis not present

## 2019-07-05 DIAGNOSIS — N8110 Cystocele, unspecified: Secondary | ICD-10-CM | POA: Diagnosis not present

## 2019-07-05 DIAGNOSIS — N816 Rectocele: Secondary | ICD-10-CM | POA: Diagnosis not present

## 2019-07-21 ENCOUNTER — Other Ambulatory Visit: Payer: Self-pay

## 2019-07-22 ENCOUNTER — Encounter: Payer: Self-pay | Admitting: Family Medicine

## 2019-07-22 ENCOUNTER — Ambulatory Visit (INDEPENDENT_AMBULATORY_CARE_PROVIDER_SITE_OTHER): Payer: Medicare Other | Admitting: Family Medicine

## 2019-07-22 ENCOUNTER — Other Ambulatory Visit (HOSPITAL_BASED_OUTPATIENT_CLINIC_OR_DEPARTMENT_OTHER): Payer: Self-pay | Admitting: Medical

## 2019-07-22 VITALS — BP 132/88 | HR 92 | Temp 95.7°F | Ht 68.0 in | Wt 228.0 lb

## 2019-07-22 DIAGNOSIS — R0781 Pleurodynia: Secondary | ICD-10-CM | POA: Diagnosis not present

## 2019-07-22 DIAGNOSIS — Z1231 Encounter for screening mammogram for malignant neoplasm of breast: Secondary | ICD-10-CM

## 2019-07-22 MED ORDER — METHYLPREDNISOLONE ACETATE 80 MG/ML IJ SUSP
80.0000 mg | Freq: Once | INTRAMUSCULAR | Status: AC
  Administered 2019-07-22: 80 mg via INTRAMUSCULAR

## 2019-07-22 MED ORDER — PREDNISONE 20 MG PO TABS: 40.0000 mg | ORAL_TABLET | Freq: Every day | ORAL | 0 refills | Status: AC

## 2019-07-22 NOTE — Patient Instructions (Signed)
Heat (pad or rice pillow in microwave) over affected area, 10-15 minutes twice daily.   Ice/cold pack over area for 10-15 min twice daily.  OK to take Tylenol 1000 mg (2 extra strength tabs) or 975 mg (3 regular strength tabs) every 6 hours as needed.  Go back to the stretches from Dr. Barbaraann Barthel until you see PT. Someone should reach out to you in the next few business days regarding this.  Let us know if you need anything.

## 2019-07-22 NOTE — Progress Notes (Signed)
Musculoskeletal Exam  Patient: Judy [name] DOB: 09-04-1951  DOS: 07/22/2019  SUBJECTIVE:  Chief Complaint:   Chief Complaint  Patient presents with  . Ear Fullness    right ear    Judy [name] is a 68 y.o.  female for evaluation and treatment of R side pain.   Onset:  1 day ago got worse. No inj or change in activity recently. Chronic issue for >5 years.  Location: R side Character:  congested/achy  Progression of issue:  has worsened Associated symptoms: none Treatment: to date has been ice, muscle relaxers and opioids.   Had gone to sports med, unremarkable workup, no tx has been helpful.  Neurovascular symptoms: no  ROS: Musculoskeletal/Extremities: +R side pain  Past Medical History:  Diagnosis Date  . Anemia    sickle cell trait  . Arthritis   . Back pain   . DDD (degenerative disc disease)   . Depression   . Dysplasia of cervix   . GERD (gastroesophageal reflux disease)   . History of blood transfusion   . Hyperlipidemia   . Hypertension   . Obesity   . OSA (obstructive sleep apnea)   . Pulmonary embolism ([place]) 08/1975  . Shortness of breath    07/25/16 denies at present  . Sleep apnea    does have a cpap  . Torn rotator cuff 07/2016   left    Objective: VITAL SIGNS: BP 132/88 (BP Location: Left Arm, Patient Position: Sitting, Cuff Size: Large)   Pulse 92   Temp (!) 95.7 F (35.4 C) (Temporal)   Ht 5\' 8"  (1.727 m)   Wt 228 lb (103.4 kg)   SpO2 97%   BMI 34.67 kg/m  Constitutional: Well formed, well developed. No acute distress. Cardiovascular: Brisk cap refill Thorax & Lungs: No accessory muscle use Musculoskeletal: R side.   Tenderness to palpation: Yes; over the posterior and anterior axillary line over the lower right rib cage Deformity: no Ecchymosis: no Neurologic: Normal sensory function. No focal deficits noted. DTR's equal and symmetric in UE's. No clonus. Psychiatric: Normal mood. Age appropriate judgment and insight. Alert &  oriented x 3.    Assessment:  Rib pain on right side - Plan: Ambulatory referral to Physical Therapy, methylPREDNISolone acetate (DEPO-MEDROL) injection 80 mg, start prednisone tomorrow.  Ice. We will get her set up with physical therapy as this is been going on for long time.  I believe she would benefit from manual therapy.  Plan: Orders as above. F/u prn w me. The patient voiced understanding and agreement to the plan.   [place], DO 07/22/19  11:57 AM

## 2019-07-25 ENCOUNTER — Other Ambulatory Visit: Payer: Self-pay

## 2019-07-25 ENCOUNTER — Ambulatory Visit (HOSPITAL_BASED_OUTPATIENT_CLINIC_OR_DEPARTMENT_OTHER)
Admission: RE | Admit: 2019-07-25 | Discharge: 2019-07-25 | Disposition: A | Discharge status: Home / Self Care | Payer: Medicare Other | Source: Ambulatory Visit | Attending: Medical | Admitting: Medical

## 2019-07-25 ENCOUNTER — Encounter (HOSPITAL_BASED_OUTPATIENT_CLINIC_OR_DEPARTMENT_OTHER): Payer: Self-pay

## 2019-07-25 DIAGNOSIS — Z1231 Encounter for screening mammogram for malignant neoplasm of breast: Secondary | ICD-10-CM | POA: Diagnosis not present

## 2019-07-26 ENCOUNTER — Telehealth: Payer: Self-pay

## 2019-07-26 ENCOUNTER — Other Ambulatory Visit: Payer: Self-pay | Admitting: Family Medicine

## 2019-07-26 ENCOUNTER — Encounter: Payer: Self-pay | Admitting: Family Medicine

## 2019-07-26 MED ORDER — VALACYCLOVIR HCL 500 MG PO TABS: 1000.0000 mg | ORAL_TABLET | Freq: Three times a day (TID) | ORAL | 0 refills | Status: AC

## 2019-07-26 NOTE — Telephone Encounter (Signed)
Copied from Kenneth [phone]. Topic: General - Other >> Jul 26, 2019 10:26 AM Alanda Slim E wrote: Reason for CRM: Brianna from [facility] called and stated that the Pt rescheduled her PT appt to 8.27.20 @ 3pm

## 2019-07-27 ENCOUNTER — Encounter: Payer: Self-pay | Admitting: Family Medicine

## 2019-07-28 MED ORDER — PREDNISONE 20 MG PO TABS: 20.0000 mg | ORAL_TABLET | Freq: Every day | ORAL | 0 refills | Status: AC

## 2019-07-30 ENCOUNTER — Other Ambulatory Visit: Payer: Self-pay | Admitting: Medical

## 2019-07-30 DIAGNOSIS — I10 Essential (primary) hypertension: Secondary | ICD-10-CM

## 2019-08-05 ENCOUNTER — Encounter: Payer: Self-pay | Admitting: Family Medicine

## 2019-08-08 ENCOUNTER — Encounter: Payer: Self-pay | Admitting: Family Medicine

## 2019-08-08 ENCOUNTER — Ambulatory Visit (INDEPENDENT_AMBULATORY_CARE_PROVIDER_SITE_OTHER): Payer: Medicare Other | Admitting: Family Medicine

## 2019-08-08 DIAGNOSIS — B0229 Other postherpetic nervous system involvement: Secondary | ICD-10-CM

## 2019-08-08 MED ORDER — AMITRIPTYLINE HCL 25 MG PO TABS: 25.0000 mg | ORAL_TABLET | Freq: Every day | ORAL | 0 refills | Status: DC

## 2019-08-08 NOTE — Progress Notes (Signed)
Chief Complaint  Patient presents with  . pain in rib cage on right side.  started a couple weeks ago.    Judy [name] is a 68 y.o. female here for a skin complaint. Due to COVID-19 pandemic, we are interacting via web portal for an electronic face-to-face visit. I verified patient's ID using 2 identifiers. Patient agreed to proceed with visit via this method. Patient is at home, I am at office. Patient and I are present for visit.   Duration: 2 weeks Location: R rib cage Pruritic? No Painful? Yes Drainage? No New soaps/lotions/topicals/detergents? No Sick contacts? No Other associated symptoms: blistered Therapies tried thus far: Valacyclovir, prednisone that did work, but s/s's returned after stopping med Rash is turning purple now.   ROS:  Const: No fevers Skin: As noted in HPI  Past Medical History:  Diagnosis Date  . Anemia    sickle cell trait  . Arthritis   . Back pain   . DDD (degenerative disc disease)   . Depression   . Dysplasia of cervix   . GERD (gastroesophageal reflux disease)   . History of blood transfusion   . Hyperlipidemia   . Hypertension   . Obesity   . OSA (obstructive sleep apnea)   . Pulmonary embolism ([place]) 08/1975  . Shortness of breath    07/25/16 denies at present  . Sleep apnea    does have a cpap  . Torn rotator cuff 07/2016   left   Exam No conversational dyspnea Age appropriate judgment and insight Nml affect and mood  Neuralgia, post-herpetic - Plan: amitriptyline (ELAVIL) 25 MG tablet  Pt wants prednisone, I would like to try this. If no improvement, we will do a 7 d course of 20 mg/d pred hoping that will carry [place] over. No more steroids after that and we will trial oxcarb and refer to neuro if no improvement. She did not do well with pregabalin or gabapentin in past for other issues so we will avoid.  F/u prn. The patient voiced understanding and agreement to the plan.  [place], DO 08/08/19 11:32 AM

## 2019-08-17 DIAGNOSIS — N393 Stress incontinence (female) (male): Secondary | ICD-10-CM | POA: Diagnosis not present

## 2019-09-23 ENCOUNTER — Telehealth: Payer: Self-pay | Admitting: *Deleted

## 2019-09-23 NOTE — Telephone Encounter (Signed)
Patient requesting refills on Flexeril, Celebrex and Lunesta.   For the Flexeril and Celebrex this patient was referred to sport med, Dr. Felipe Drone.  Her visit was on 03/30/19 and plan was   ASSESSMENT & PLAN: Rib pain on right side - Pain likely related to somatic dysfunction exacerbated by overuse - recommend conservative treatment - Naproxen 500mg  bid prescribed, stop taking celebrex, counseled on s/e profile - Flexeril switched to Robaxin 500mg  TID PRN as a muscle relaxant, counseled on s/e profile - Compression with a rib belt recommended with activity - May trial topical lidoderm patches vs capsaicin cream vs aspercreme if desired - Serratus anterior and latissmus dorsi stretches given to be completed daily - strengthening exercises to be completed once pain has improved - f/u in 4-6 wks if not improving  Her appointment was cancelled and she has not followed up with them.  Please advise on refills.  For Lunesta  Last written: 06/27/19 Last ov: with you was 03/16/19 Next ov: nothing scheduled Contract: none UDS: none

## 2019-09-24 NOTE — Telephone Encounter (Signed)
What she wants differs from what specialist recommended. Can you get scheduled on Tuesday for virtual visit or phone visit.   Need to discuss how much pain she is in on how she did with Dr. Timothy Lasso regimen.

## 2019-09-27 MED ORDER — ESZOPICLONE 2 MG PO TABS: ORAL_TABLET | ORAL | 0 refills | Status: DC

## 2019-09-27 NOTE — Telephone Encounter (Signed)
Refill lunesta sent to pharmacy.

## 2019-09-27 NOTE — Telephone Encounter (Signed)
Patient stated that she is still having pain.  Appointment set up for tomorrow afternoon.  Can you please review the note about lunesta (in same note) refill?

## 2019-09-28 ENCOUNTER — Encounter: Payer: Self-pay | Admitting: Medical

## 2019-09-28 ENCOUNTER — Telehealth (INDEPENDENT_AMBULATORY_CARE_PROVIDER_SITE_OTHER): Payer: Medicare Other | Admitting: Medical

## 2019-09-28 ENCOUNTER — Other Ambulatory Visit: Payer: Self-pay

## 2019-09-28 VITALS — BP 144/89 | HR 92 | Temp 97.7°F | Wt 224.0 lb

## 2019-09-28 DIAGNOSIS — E785 Hyperlipidemia, unspecified: Secondary | ICD-10-CM

## 2019-09-28 DIAGNOSIS — M792 Neuralgia and neuritis, unspecified: Secondary | ICD-10-CM | POA: Diagnosis not present

## 2019-09-28 DIAGNOSIS — I10 Essential (primary) hypertension: Secondary | ICD-10-CM | POA: Diagnosis not present

## 2019-09-28 DIAGNOSIS — R739 Hyperglycemia, unspecified: Secondary | ICD-10-CM

## 2019-09-28 MED ORDER — TRAMADOL HCL 50 MG PO TABS: 50.0000 mg | ORAL_TABLET | Freq: Four times a day (QID) | ORAL | 0 refills | Status: AC | PRN

## 2019-09-28 MED ORDER — CELECOXIB 200 MG PO CAPS: ORAL_CAPSULE | ORAL | 0 refills | Status: DC
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
## 2019-09-28 NOTE — Patient Instructions (Addendum)
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  I think it is a good idea for you to try tramadol as this can help with nerve pain.  Advised to discontinue amitriptyline.  We will see how you do with tramadol and if this is needed on ongoing basis then will need to have me sign controlled medication contract and give UDS.  Your blood pressure is elevated today and this might be due to knee pain described.  Previously blood pressure was in the 130/70 range.  Keep checking blood pressure daily and if blood pressures are consistently above 140/90 then will need to make BP medication changes.  You have bilateral chronic knee pain.  Did refill your Celebrex.  You are aware that this can increase your blood pressure.  So would need to follow this closely.  Did go ahead and place future labs to be done.  These include metabolic panel, lipid panel and A1c.  Follow-up date to be determined after lab review.  Also asked patient to send me a MyChart message by early next week as to how she is doing with the tramadol.

## 2019-10-03 DIAGNOSIS — H43811 Vitreous degeneration, right eye: Secondary | ICD-10-CM | POA: Diagnosis not present

## 2019-10-04 ENCOUNTER — Encounter: Payer: Self-pay | Admitting: Medical

## 2019-10-04 NOTE — Telephone Encounter (Signed)
Pt scheduled for lab appointment Thursday

## 2019-10-06 ENCOUNTER — Telehealth: Payer: Self-pay | Admitting: Medical

## 2019-10-06 ENCOUNTER — Other Ambulatory Visit: Payer: Self-pay

## 2019-10-06 ENCOUNTER — Other Ambulatory Visit (INDEPENDENT_AMBULATORY_CARE_PROVIDER_SITE_OTHER): Payer: Medicare Other

## 2019-10-06 DIAGNOSIS — I10 Essential (primary) hypertension: Secondary | ICD-10-CM

## 2019-10-06 DIAGNOSIS — R739 Hyperglycemia, unspecified: Secondary | ICD-10-CM

## 2019-10-06 DIAGNOSIS — E785 Hyperlipidemia, unspecified: Secondary | ICD-10-CM

## 2019-10-06 DIAGNOSIS — Z79899 Other long term (current) drug therapy: Secondary | ICD-10-CM

## 2019-10-06 LAB — COMPREHENSIVE METABOLIC PANEL
ALT: 10 U/L (ref 0–35)
AST: 18 U/L (ref 0–37)
Albumin: 4.1 g/dL (ref 3.5–5.2)
Alkaline Phosphatase: 88 U/L (ref 39–117)
BUN: 15 mg/dL (ref 6–23)
CO2: 31 mEq/L (ref 19–32)
Calcium: 9.5 mg/dL (ref 8.4–10.5)
Chloride: 103 mEq/L (ref 96–112)
Creatinine, Ser: 0.81 mg/dL (ref 0.40–1.20)
GFR: 85.56 mL/min (ref >60.00)
Glucose, Bld: 92 mg/dL (ref 70–99)
Potassium: 4.2 mEq/L (ref 3.5–5.1)
Sodium: 142 mEq/L (ref 135–145)
Total Bilirubin: 1 mg/dL (ref 0.2–1.2)
Total Protein: 6.9 g/dL (ref 6.0–8.3)

## 2019-10-06 LAB — LIPID PANEL
Cholesterol: 274 mg/dL — ABNORMAL HIGH (ref 0–200)
HDL: 56.2 mg/dL (ref >39.00)
LDL Cholesterol: 198 mg/dL — ABNORMAL HIGH (ref 0–99)
NonHDL: 217.7
Total CHOL/HDL Ratio: 5
Triglycerides: 98 mg/dL (ref 0.0–149.0)
VLDL: 19.6 mg/dL (ref 0.0–40.0)

## 2019-10-06 LAB — HEMOGLOBIN A1C: Hgb A1c MFr Bld: 5.4 % (ref 4.6–6.5)

## 2019-10-06 MED ORDER — TRAMADOL HCL 50 MG PO TABS: ORAL_TABLET | ORAL | 0 refills | Status: DC

## 2019-10-06 NOTE — Telephone Encounter (Signed)
Pt has been scheduled for UDS and sign contract on 11/2. Nothing further needed.

## 2019-10-06 NOTE — Telephone Encounter (Signed)
Will you get pt scheduled just to give uds and sign contract Monday, tues or wed.

## 2019-10-09 ENCOUNTER — Encounter: Payer: Self-pay | Admitting: Medical

## 2019-10-10 ENCOUNTER — Telehealth: Payer: Self-pay | Admitting: Medical

## 2019-10-10 ENCOUNTER — Other Ambulatory Visit: Payer: Federal, State, Local not specified - PPO

## 2019-10-10 ENCOUNTER — Other Ambulatory Visit: Payer: Self-pay

## 2019-10-10 ENCOUNTER — Ambulatory Visit: Payer: Medicare Other

## 2019-10-10 DIAGNOSIS — Z79899 Other long term (current) drug therapy: Secondary | ICD-10-CM | POA: Diagnosis not present

## 2019-10-10 NOTE — Telephone Encounter (Signed)
Will you mail copy of pt most recent cmp, a1c and lipid panel. I accidentally clicked review but did not send her labs by my chart. Is there way to send most recent cmp, lipid panel and a1c to her my chart?

## 2019-10-10 NOTE — Telephone Encounter (Signed)
Pt signed contract and did uds today

## 2019-10-10 NOTE — Telephone Encounter (Signed)
I did not get a message stating she gets 60 tablets. The only message a have is the one to schedule her for a uds and contract

## 2019-10-11 LAB — PAIN MGMT, PROFILE 8 W/CONF, U
6 Acetylmorphine: NEGATIVE ng/mL
Alcohol Metabolites: NEGATIVE ng/mL (ref <500)
Amphetamines: NEGATIVE ng/mL
Benzodiazepines: NEGATIVE ng/mL
Buprenorphine, Urine: NEGATIVE ng/mL
Cocaine Metabolite: NEGATIVE ng/mL
Creatinine: 135.3 mg/dL
MDMA: NEGATIVE ng/mL
Marijuana Metabolite: NEGATIVE ng/mL
Opiates: NEGATIVE ng/mL
Oxidant: NEGATIVE ug/mL
Oxycodone: NEGATIVE ng/mL
pH: 5.1 (ref 4.5–9.0)

## 2019-10-12 ENCOUNTER — Telehealth: Payer: Self-pay | Admitting: Medical

## 2019-10-12 MED ORDER — TRAMADOL HCL 50 MG PO TABS: ORAL_TABLET | ORAL | 0 refills | Status: DC

## 2019-10-12 NOTE — Telephone Encounter (Signed)
Rx refill tramadol sent to pt pharmacy.

## 2019-10-18 ENCOUNTER — Encounter: Payer: Self-pay | Admitting: Medical

## 2019-11-08 ENCOUNTER — Other Ambulatory Visit: Payer: Self-pay | Admitting: Medical

## 2019-11-08 DIAGNOSIS — I10 Essential (primary) hypertension: Secondary | ICD-10-CM

## 2019-11-08 MED ORDER — TRAMADOL HCL 50 MG PO TABS: ORAL_TABLET | ORAL | 0 refills | Status: DC

## 2019-11-08 MED ORDER — CANDESARTAN CILEXETIL 16 MG PO TABS: 16.0000 mg | ORAL_TABLET | Freq: Every day | ORAL | 0 refills | Status: DC

## 2019-11-08 NOTE — Telephone Encounter (Addendum)
Refill request: Tramadol   Last RX:10/12/19 Last OV:10/10/19 Next OV: UDS:10/10/19 CSC:10/10/19 CSR:11/08/2019  Rx tramadol refill sent to pharmacy.

## 2019-12-08 ENCOUNTER — Encounter: Payer: Self-pay | Admitting: Medical

## 2019-12-08 ENCOUNTER — Emergency Department (HOSPITAL_BASED_OUTPATIENT_CLINIC_OR_DEPARTMENT_OTHER)
Admission: EM | Admit: 2019-12-08 | Discharge: 2019-12-08 | Disposition: A | Discharge status: Home / Self Care | Payer: Medicare Other | Attending: Emergency Medicine | Admitting: Emergency Medicine

## 2019-12-08 ENCOUNTER — Encounter (HOSPITAL_BASED_OUTPATIENT_CLINIC_OR_DEPARTMENT_OTHER): Payer: Self-pay | Admitting: Emergency Medicine

## 2019-12-08 ENCOUNTER — Other Ambulatory Visit: Payer: Self-pay

## 2019-12-08 DIAGNOSIS — E785 Hyperlipidemia, unspecified: Secondary | ICD-10-CM | POA: Diagnosis not present

## 2019-12-08 DIAGNOSIS — Z79899 Other long term (current) drug therapy: Secondary | ICD-10-CM | POA: Diagnosis not present

## 2019-12-08 DIAGNOSIS — M25512 Pain in left shoulder: Secondary | ICD-10-CM | POA: Diagnosis present

## 2019-12-08 DIAGNOSIS — M7582 Other shoulder lesions, left shoulder: Secondary | ICD-10-CM

## 2019-12-08 DIAGNOSIS — I10 Essential (primary) hypertension: Secondary | ICD-10-CM | POA: Diagnosis not present

## 2019-12-08 DIAGNOSIS — Z86711 Personal history of pulmonary embolism: Secondary | ICD-10-CM | POA: Diagnosis not present

## 2019-12-08 DIAGNOSIS — M7522 Bicipital tendinitis, left shoulder: Secondary | ICD-10-CM | POA: Insufficient documentation

## 2019-12-08 DIAGNOSIS — Z888 Allergy status to other drugs, medicaments and biological substances status: Secondary | ICD-10-CM | POA: Insufficient documentation

## 2019-12-08 DIAGNOSIS — Z91041 Radiographic dye allergy status: Secondary | ICD-10-CM | POA: Diagnosis not present

## 2019-12-08 MED ORDER — OXYCODONE-ACETAMINOPHEN 5-325 MG PO TABS: 2.0000 | ORAL_TABLET | Freq: Four times a day (QID) | ORAL | 0 refills | Status: DC | PRN

## 2019-12-08 MED ORDER — OXYCODONE-ACETAMINOPHEN 5-325 MG PO TABS
2.0000 | ORAL_TABLET | Freq: Once | ORAL | Status: AC
  Administered 2019-12-08: 2 via ORAL
  Filled 2019-12-08: qty 2

## 2019-12-08 MED ORDER — ONDANSETRON 8 MG PO TBDP
8.0000 mg | ORAL_TABLET | Freq: Once | ORAL | Status: AC
  Administered 2019-12-08: 06:00:00 8 mg via ORAL
  Filled 2019-12-08: qty 1

## 2019-12-08 MED ORDER — ONDANSETRON 8 MG PO TBDP: 8.0000 mg | ORAL_TABLET | Freq: Three times a day (TID) | ORAL | 1 refills | Status: DC | PRN
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
## 2019-12-08 NOTE — ED Triage Notes (Signed)
  Patient comes in with L shoulder pain that started yesterday.  Patient states she was cooking and felt a pain in both of her shoulders.  She thought she may have lifted something heavy and it was difficult to lift both arms.  Patient states her L arm feels heavy and she can only move it with the other arm.  Pain 8/10.  No cardiac hx.  Pain stays in shoulder and does not radiate.

## 2019-12-09 ENCOUNTER — Telehealth: Payer: Self-pay | Admitting: Medical

## 2019-12-09 DIAGNOSIS — M25519 Pain in unspecified shoulder: Secondary | ICD-10-CM

## 2019-12-09 NOTE — Telephone Encounter (Signed)
Refer to sports medicine.  

## 2019-12-13 ENCOUNTER — Ambulatory Visit (HOSPITAL_BASED_OUTPATIENT_CLINIC_OR_DEPARTMENT_OTHER)
Admission: RE | Admit: 2019-12-13 | Discharge: 2019-12-13 | Disposition: A | Discharge status: Home / Self Care | Payer: Medicare Other | Source: Ambulatory Visit | Attending: Family Medicine | Admitting: Family Medicine

## 2019-12-13 ENCOUNTER — Other Ambulatory Visit: Payer: Self-pay

## 2019-12-13 ENCOUNTER — Encounter: Payer: Self-pay | Admitting: Family Medicine

## 2019-12-13 ENCOUNTER — Ambulatory Visit (INDEPENDENT_AMBULATORY_CARE_PROVIDER_SITE_OTHER): Payer: Medicare Other | Admitting: Family Medicine

## 2019-12-13 ENCOUNTER — Ambulatory Visit: Payer: Self-pay

## 2019-12-13 VITALS — BP 141/82 | HR 73 | Ht 68.0 in | Wt 225.0 lb

## 2019-12-13 DIAGNOSIS — M25512 Pain in left shoulder: Secondary | ICD-10-CM | POA: Diagnosis not present

## 2019-12-13 DIAGNOSIS — M19012 Primary osteoarthritis, left shoulder: Secondary | ICD-10-CM

## 2019-12-13 MED ORDER — TRIAMCINOLONE ACETONIDE 40 MG/ML IJ SUSP
40.0000 mg | Freq: Once | INTRAMUSCULAR | Status: AC
  Administered 2019-12-13: 11:00:00 40 mg via INTRA_ARTICULAR
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
## 2019-12-13 NOTE — Patient Instructions (Signed)
Nice to meet you Please try ice  Please try the range of motion movements  I will call with the results   Please send me a message in MyChart with any questions or updates.  Please see me back in 4 weeks.   --Dr. Raeford Razor

## 2019-12-14 ENCOUNTER — Telehealth: Payer: Self-pay | Admitting: Family Medicine

## 2019-12-14 DIAGNOSIS — M19012 Primary osteoarthritis, left shoulder: Secondary | ICD-10-CM | POA: Insufficient documentation

## 2019-12-14 HISTORY — DX: Primary osteoarthritis, left shoulder: M19.012

## 2019-12-14 NOTE — Telephone Encounter (Signed)
Informed of results.   Rosemarie Ax, MD [facility] 12/14/2019, 10:43 AM

## 2019-12-14 NOTE — Assessment & Plan Note (Signed)
Having catching as she adducts her shoulder. Seems more joint related. Not likely rotator cuff.  - Dunbar injection - counseled on HEP and supportive care - xray  - consider PT.

## 2019-12-20 ENCOUNTER — Ambulatory Visit: Payer: Medicare Other | Admitting: Orthopaedic Surgery

## 2019-12-24 ENCOUNTER — Other Ambulatory Visit: Payer: Self-pay | Admitting: Medical

## 2020-01-10 ENCOUNTER — Ambulatory Visit (INDEPENDENT_AMBULATORY_CARE_PROVIDER_SITE_OTHER): Payer: Medicare Other | Admitting: Family Medicine

## 2020-01-10 ENCOUNTER — Encounter: Payer: Self-pay | Admitting: Family Medicine

## 2020-01-10 ENCOUNTER — Other Ambulatory Visit: Payer: Self-pay

## 2020-01-10 DIAGNOSIS — M19012 Primary osteoarthritis, left shoulder: Secondary | ICD-10-CM
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
## 2020-01-10 NOTE — Patient Instructions (Signed)
Good to see you Please use ice if needed  Please continue the exercises   Please send me a message in MyChart with any questions or updates.  Please see [place] back as needed or in 4 weeks.   --Dr. Raeford Razor

## 2020-01-10 NOTE — Assessment & Plan Note (Signed)
Improvement with the glenohumeral injection. -Counseled on home exercise therapy and supportive care. -Could consider physical therapy.

## 2020-01-20 ENCOUNTER — Ambulatory Visit (INDEPENDENT_AMBULATORY_CARE_PROVIDER_SITE_OTHER)
Admission: RE | Admit: 2020-01-20 | Discharge: 2020-01-20 | Disposition: A | Discharge status: Home / Self Care | Payer: Medicare Other | Source: Ambulatory Visit

## 2020-01-20 ENCOUNTER — Other Ambulatory Visit: Payer: Self-pay

## 2020-01-20 DIAGNOSIS — N390 Urinary tract infection, site not specified: Secondary | ICD-10-CM

## 2020-01-20 MED ORDER — CEPHALEXIN 500 MG PO CAPS: 500.0000 mg | ORAL_CAPSULE | Freq: Four times a day (QID) | ORAL | 0 refills | Status: AC

## 2020-01-20 NOTE — Discharge Instructions (Addendum)
I have sent antibiotics to be started for suspicion for UTI.  Drink plenty of water to empty bladder regularly. Avoid alcohol and caffeine as these may irritate the bladder.   If no improvement or if worsening after taking for 48 hours please be seen in person so a culture can be obtained.

## 2020-01-20 NOTE — ED Provider Notes (Signed)
Virtual Visit via Video Note:  Judy [name]  initiated request for Telemedicine visit with [facility] team. I connected with Judy [name]  on 01/20/2020 at 6:16 PM  for a synchronized telemedicine visit using a video enabled HIPPA compliant telemedicine application. I verified that I am speaking with Judy [name]  using two identifiers. Zigmund Gottron, NP  was physically located in a [facility]gent care site and Judy [name] was located at a different location.   The limitations of evaluation and management by telemedicine as well as the availability of in-person appointments were discussed. Patient was informed that she  may incur a bill ( including co-pay) for this virtual visit encounter. Judy [name]  expressed understanding and gave verbal consent to proceed with virtual visit.     History of Present Illness:Judy [name]  is a 69 y.o. female presents with complaints of pain with urination which started yesterday. States due to an event she hadn't drank any water on 2/7, and has had significant stress recently related to family illness and her son getting hospitalized, which has distracted her from her typical self care to prevent UTI's. Has noted odor to urine. Denies any vaginal or vulvar symptoms, she is not sexually active. 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 Has not had any issues with this since. Used to have frequent uti's, hasn't had one recently however. No known previous antibiotic resistance. No back pain or abdominal pain.     Past Medical History:  Diagnosis Date  . Anemia    sickle cell trait  . Arthritis   . Back pain   . DDD (degenerative disc disease)   . Depression   . Dysplasia of cervix   . GERD (gastroesophageal reflux disease)   . History of blood transfusion   . Hyperlipidemia   . Hypertension   . Obesity   . OSA (obstructive sleep apnea)   . Pulmonary embolism ([place]) 08/1975  . Shortness of  breath    07/25/16 denies at present  . Sleep apnea    does have a cpap  . Torn rotator cuff 07/2016   left    Allergies  Allergen Reactions  . Contrast Media [Iodinated Diagnostic Agents] Shortness Of Breath    Swelling mouth  . Ioxaglate Shortness Of Breath    Swelling mouth  . Ivp Dye [Iodinated Diagnostic Agents] Swelling  . Metrizamide Shortness Of Breath and Swelling    Swelling mouth  . Atorvastatin Other (See Comments)    Muscle aches requiring increased use of pain medication Muscle aches requiring increased use of pain medication        Observations/Objective: Alert, oriented, non toxic in appearance. Clear coherent speech without difficulty. No increased work of breathing visualized.  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 Opted to initiate antibiotics with strict in person precautions provided for culture if any persistent or no improvement of symptoms. Patient verbalized understanding and agreeable to plan.     Follow Up Instructions:    I discussed the assessment and treatment plan with the patient. The patient was provided an opportunity to ask questions and all were answered. The patient agreed with the plan and demonstrated an understanding of the instructions.   The patient was advised to call back or seek an in-person evaluation if the symptoms  worsen or if the condition fails to improve as anticipated.  I provided 15 minutes of non-face-to-face time during this encounter.    Zigmund Gottron, NP  01/20/2020 6:16 PM         Zigmund Gottron, NP 01/20/20 1817

## 2020-01-22 ENCOUNTER — Other Ambulatory Visit: Payer: Self-pay

## 2020-01-22 ENCOUNTER — Ambulatory Visit
Admission: EM | Admit: 2020-01-22 | Discharge: 2020-01-22 | Disposition: A | Discharge status: Home / Self Care | Payer: Medicare Other | Attending: Emergency Medicine | Admitting: Emergency Medicine

## 2020-01-22 ENCOUNTER — Telehealth: Payer: Medicare Other

## 2020-01-22 DIAGNOSIS — R300 Dysuria: Secondary | ICD-10-CM | POA: Diagnosis not present

## 2020-01-22 LAB — POCT URINALYSIS DIP (MANUAL ENTRY)
Glucose, UA: 250 mg/dL — AB
Ketones, POC UA: NEGATIVE mg/dL
Nitrite, UA: POSITIVE — AB
Protein Ur, POC: 100 mg/dL — AB
Spec Grav, UA: 1.02 (ref 1.010–1.025)
Urobilinogen, UA: 2 E.U./dL — AB
pH, UA: 5 (ref 5.0–8.0)

## 2020-01-22 MED ORDER — FLUCONAZOLE 200 MG PO TABS: 200.0000 mg | ORAL_TABLET | Freq: Once | ORAL | 0 refills | Status: AC

## 2020-01-22 MED ORDER — KETOROLAC TROMETHAMINE 10 MG PO TABS: 10.0000 mg | ORAL_TABLET | Freq: Four times a day (QID) | ORAL | 0 refills | Status: DC | PRN

## 2020-01-22 NOTE — ED Triage Notes (Addendum)
Patient presents with complaint of pain with urination x 4 days.  She reports taking Pyridium and prescribed antibiotics without relief.
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
## 2020-01-22 NOTE — Discharge Instructions (Addendum)
Urine culture pending: will call if we need to change antibiotics.f May take Toradol as needed for pain. Call urology to make follow up appointment. Return for blood in urine, belly/back pain, fever.

## 2020-01-24 LAB — URINE CULTURE: Culture: NO GROWTH

## 2020-01-31 DIAGNOSIS — R3989 Other symptoms and signs involving the genitourinary system: Secondary | ICD-10-CM | POA: Diagnosis not present

## 2020-01-31 DIAGNOSIS — Z87448 Personal history of other diseases of urinary system: Secondary | ICD-10-CM | POA: Diagnosis not present

## 2020-02-01 ENCOUNTER — Ambulatory Visit (INDEPENDENT_AMBULATORY_CARE_PROVIDER_SITE_OTHER): Payer: Medicare Other | Admitting: Medical

## 2020-02-01 ENCOUNTER — Other Ambulatory Visit: Payer: Self-pay

## 2020-02-01 VITALS — BP 120/46 | HR 84 | Wt 224.0 lb

## 2020-02-01 DIAGNOSIS — R7989 Other specified abnormal findings of blood chemistry: Secondary | ICD-10-CM | POA: Diagnosis not present

## 2020-02-01 DIAGNOSIS — I959 Hypotension, unspecified: Secondary | ICD-10-CM

## 2020-02-01 DIAGNOSIS — R5383 Other fatigue: Secondary | ICD-10-CM

## 2020-02-01 DIAGNOSIS — R252 Cramp and spasm: Secondary | ICD-10-CM

## 2020-02-01 DIAGNOSIS — R739 Hyperglycemia, unspecified: Secondary | ICD-10-CM | POA: Diagnosis not present

## 2020-02-01 LAB — CBC WITH DIFFERENTIAL/PLATELET
Basophils Absolute: 0 10*3/uL (ref 0.0–0.1)
Basophils Relative: 0.5 % (ref 0.0–3.0)
Eosinophils Absolute: 0.1 10*3/uL (ref 0.0–0.7)
Eosinophils Relative: 1.5 % (ref 0.0–5.0)
HCT: 36.7 % (ref 36.0–46.0)
Hemoglobin: 11.8 g/dL — ABNORMAL LOW (ref 12.0–15.0)
Lymphocytes Relative: 27.6 % (ref 12.0–46.0)
Lymphs Abs: 1.8 10*3/uL (ref 0.7–4.0)
MCHC: 32.1 g/dL (ref 30.0–36.0)
MCV: 81.6 fl (ref 78.0–100.0)
Monocytes Absolute: 0.4 10*3/uL (ref 0.1–1.0)
Monocytes Relative: 5.6 % (ref 3.0–12.0)
Neutro Abs: 4.3 10*3/uL (ref 1.4–7.7)
Neutrophils Relative %: 64.8 % (ref 43.0–77.0)
Platelets: 359 10*3/uL (ref 150.0–400.0)
RBC: 4.5 Mil/uL (ref 3.87–5.11)
RDW: 14.4 % (ref 11.5–15.5)
WBC: 6.6 10*3/uL (ref 4.0–10.5)

## 2020-02-01 LAB — COMPREHENSIVE METABOLIC PANEL
ALT: 14 U/L (ref 0–35)
AST: 19 U/L (ref 0–37)
Albumin: 4 g/dL (ref 3.5–5.2)
Alkaline Phosphatase: 81 U/L (ref 39–117)
BUN: 17 mg/dL (ref 6–23)
CO2: 29 mEq/L (ref 19–32)
Calcium: 9.5 mg/dL (ref 8.4–10.5)
Chloride: 104 mEq/L (ref 96–112)
Creatinine, Ser: 0.9 mg/dL (ref 0.40–1.20)
GFR: 75.69 mL/min (ref >60.00)
Glucose, Bld: 97 mg/dL (ref 70–99)
Potassium: 4.1 mEq/L (ref 3.5–5.1)
Sodium: 139 mEq/L (ref 135–145)
Total Bilirubin: 0.6 mg/dL (ref 0.2–1.2)
Total Protein: 7.5 g/dL (ref 6.0–8.3)

## 2020-02-01 LAB — TSH: TSH: 1.19 u[IU]/mL (ref 0.35–4.50)

## 2020-02-01 LAB — VITAMIN B12: Vitamin B-12: 379 pg/mL (ref 211–911)

## 2020-02-01 LAB — MAGNESIUM: Magnesium: 2 mg/dL (ref 1.5–2.5)

## 2020-02-01 NOTE — Patient Instructions (Signed)
Your blood pressure is low today and yesterday compared to prior readings.  In light of these readings I do want you to get CBC and CMP today.  These lab results should come back stat and will see if there is underlying cause for hypotension.  After lab review we will decide if you will discontinue Atacand or just reduce by half a tablet.  Current dose is 16 mg  For recent fatigue, will follow CBC, CMP, B12, B1, vitamin D and TSH level.  For recent increased blinking reported recommend that you get over-the-counter dry eyedrops.  See if this helps.  If not when asked that you see your optometrist for eye exam.  Follow-up date to be determined after lab review.

## 2020-02-01 NOTE — Progress Notes (Signed)
   Subjective:    Patient ID: Judy [name], female    DOB: Nov 19, 1951, 69 y.o.   MRN: [id number]  HPI Virtual Visit via Video Note  I connected with Judy [name] on 02/01/20 at 11:20 AM EST by a video enabled telemedicine application and verified that I am speaking with the correct person using two identifiers.  Location: Patient: home Provider: office   I discussed the limitations of evaluation and management by telemedicine and the availability of in person appointments. The patient expressed understanding and agreed to proceed.  History of Present Illness: Pt bp [id number]  systolic and [id number] on diastolic this morning. Yesterday bp was 120/46 at urologist office.  Pt takes atacand daily.  Pt also state some cramping of her calfs on and off since superbowl Sunday. She has attempted to hydrate well. Pt has tried some mustard. Feels like muscles in legs about to cramp all the time.  Pt states she is very tired. Has dry mouth. Also states increased eye blink per friends of her when on zoom calls.      Observations/Objective: General-no acute distress, pleasant, oriented.  On on on interview does appear to be blink moderately. Lungs- on inspection lungs appear unlabored. Neck- no tracheal deviation or jvd on inspection. Neuro- gross motor function appears intact.   Assessment and Plan: Your blood pressure is low today and yesterday compared to prior readings.  In light of these readings I do want you to get CBC and CMP today.  These lab results should come back stat and will see if there is underlying cause for hypotension.  After lab review we will decide if you will discontinue Atacand or just reduce by half a tablet.  Current dose is 16 mg  For recent fatigue, will follow CBC, CMP, B12, B1, vitamin D and TSH level.  For recent increased blinking reported recommend that you get over-the-counter dry eyedrops.  See if this helps.  If not when asked that you see your optometrist for  eye exam.  Follow-up date to be determined after lab review.  Mackie Pai, PA-C     Follow Up Instructions:    I discussed the assessment and treatment plan with the patient. The patient was provided an opportunity to ask questions and all were answered. The patient agreed with the plan and demonstrated an understanding of the instructions.   The patient was advised to call back or seek an in-person evaluation if the symptoms worsen or if the condition fails to improve as anticipated.  I provided  25 minutes of non-face-to-face time during this encounter.   Mackie Pai, PA-C    Review of Systems     Objective:   Physical Exam        Assessment & Plan:

## 2020-02-02 ENCOUNTER — Other Ambulatory Visit (INDEPENDENT_AMBULATORY_CARE_PROVIDER_SITE_OTHER): Payer: Medicare Other

## 2020-02-02 DIAGNOSIS — D649 Anemia, unspecified: Secondary | ICD-10-CM | POA: Diagnosis not present

## 2020-02-02 LAB — IRON: Iron: 58 ug/dL (ref 42–145)

## 2020-02-02 LAB — HEMOGLOBIN A1C: Hgb A1c MFr Bld: 5.6 % (ref 4.6–6.5)

## 2020-02-04 LAB — VITAMIN D 1,25 DIHYDROXY
Vitamin D 1, 25 (OH)2 Total: 48 pg/mL (ref 18–72)
Vitamin D2 1, 25 (OH)2: <8 pg/mL
Vitamin D3 1, 25 (OH)2: 48 pg/mL

## 2020-02-04 LAB — VITAMIN B1: Vitamin B1 (Thiamine): <6 nmol/L — ABNORMAL LOW (ref 8–30)

## 2020-02-08 ENCOUNTER — Telehealth: Payer: Self-pay | Admitting: Medical

## 2020-02-08 ENCOUNTER — Other Ambulatory Visit: Payer: Self-pay | Admitting: Medical

## 2020-02-08 DIAGNOSIS — I10 Essential (primary) hypertension: Secondary | ICD-10-CM

## 2020-02-08 NOTE — Telephone Encounter (Signed)
Pt states that her bladder infection is back and full force. She wants to know if she can get a refill on meds or new meds sent in for her

## 2020-02-09 ENCOUNTER — Encounter: Payer: Self-pay | Admitting: Medical

## 2020-02-09 NOTE — Telephone Encounter (Signed)
I reviewed recent note of urologist. She could have urinary tract infection vs interstitial cystitis. Can we arrange for her to give UA with urine culture tomorrow. Looks like I am full schedule wise but need urine studies at least. If negative the would refer back to urologist. If urine + for infection could rx antibiotic but do need culture.

## 2020-02-10 ENCOUNTER — Other Ambulatory Visit: Payer: Self-pay

## 2020-02-10 ENCOUNTER — Ambulatory Visit (INDEPENDENT_AMBULATORY_CARE_PROVIDER_SITE_OTHER): Payer: Medicare Other | Admitting: Medical

## 2020-02-10 VITALS — BP 124/78 | HR 63 | Temp 95.1°F | Resp 16 | Ht 68.0 in | Wt 232.6 lb

## 2020-02-10 DIAGNOSIS — N390 Urinary tract infection, site not specified: Secondary | ICD-10-CM

## 2020-02-10 DIAGNOSIS — R300 Dysuria: Secondary | ICD-10-CM

## 2020-02-10 LAB — POC URINALSYSI DIPSTICK (AUTOMATED)
Bilirubin, UA: 1
Blood, UA: 3
Glucose, UA: NEGATIVE
Ketones, UA: NEGATIVE
Protein, UA: POSITIVE — AB
Spec Grav, UA: 1.015 (ref 1.010–1.025)
Urobilinogen, UA: 0.2 E.U./dL
pH, UA: 5.5 (ref 5.0–8.0)

## 2020-02-10 MED ORDER — CIPROFLOXACIN HCL 250 MG PO TABS: 250.0000 mg | ORAL_TABLET | Freq: Two times a day (BID) | ORAL | 0 refills | Status: DC

## 2020-02-10 NOTE — Telephone Encounter (Signed)
Patient has ov today. 

## 2020-02-10 NOTE — Addendum Note (Signed)
Addended by: Jeronimo Greaves on: 02/10/2020 10:22 AM   Modules accepted: Orders
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
## 2020-02-10 NOTE — Patient Instructions (Signed)
Your appear to have a urinary tract infection. I am prescribing cipro antibiotic for the probable infection. Hydrate well. I am sending out a urine culture. During the interim if your signs and symptoms worsen rather than improving please notify us. We will notify your when the culture results are back.   Also if recurrent signs/symptoms(or neg culture) consider referral back to urologist for IC since in past various culture negative when you had uti like symptoms.  Follow up in 7 days or as needed.

## 2020-02-12 LAB — URINE CULTURE
MICRO NUMBER:: 10219617
SPECIMEN QUALITY:: ADEQUATE

## 2020-02-13 ENCOUNTER — Telehealth: Payer: Self-pay | Admitting: Medical

## 2020-02-13 MED ORDER — CIPROFLOXACIN HCL 500 MG PO TABS: 500.0000 mg | ORAL_TABLET | Freq: Two times a day (BID) | ORAL | 0 refills | Status: DC

## 2020-02-13 NOTE — Telephone Encounter (Signed)
cipro rx sent to pt pharmacy. 

## 2020-02-22 ENCOUNTER — Ambulatory Visit: Payer: Self-pay

## 2020-02-22 ENCOUNTER — Other Ambulatory Visit: Payer: Self-pay

## 2020-02-22 ENCOUNTER — Encounter: Payer: Self-pay | Admitting: Medical

## 2020-02-22 ENCOUNTER — Ambulatory Visit (INDEPENDENT_AMBULATORY_CARE_PROVIDER_SITE_OTHER): Payer: Medicare Other | Admitting: Family Medicine

## 2020-02-22 ENCOUNTER — Encounter: Payer: Self-pay | Admitting: Family Medicine

## 2020-02-22 VITALS — BP 156/82 | HR 71 | Ht 68.0 in | Wt 225.0 lb

## 2020-02-22 DIAGNOSIS — M25511 Pain in right shoulder: Secondary | ICD-10-CM

## 2020-02-22 DIAGNOSIS — M7541 Impingement syndrome of right shoulder: Secondary | ICD-10-CM

## 2020-02-22 DIAGNOSIS — M25531 Pain in right wrist: Secondary | ICD-10-CM

## 2020-02-22 HISTORY — DX: Pain in right wrist: M25.531

## 2020-02-22 HISTORY — DX: Impingement syndrome of right shoulder: M75.41

## 2020-02-22 MED ORDER — METHYLPREDNISOLONE ACETATE 40 MG/ML IJ SUSP
40.0000 mg | Freq: Once | INTRAMUSCULAR | Status: AC
  Administered 2020-02-22: 40 mg via INTRA_ARTICULAR

## 2020-02-22 MED ORDER — PREDNISONE 5 MG PO TABS: ORAL_TABLET | ORAL | 0 refills | Status: DC

## 2020-02-22 NOTE — Patient Instructions (Signed)
Good to see you Please try ice  Please try the exercises  Please let me know if the redness and swelling return  Please send me a message in MyChart with any questions or updates.  Please see me back in 4 weeks.   --Dr. Raeford Razor

## 2020-02-22 NOTE — Assessment & Plan Note (Signed)
Appears to have impingement based on exam and ultrasound.  She will also have glenohumeral contribution to the pain. -Injection. -Counseled on home exercise therapy and supportive care. -If no improvement could consider AC joint injection or a glenohumeral injection.  Could consider physical therapy or imaging.

## 2020-02-22 NOTE — Assessment & Plan Note (Signed)
Having redness and swelling over the dorsum of the ulnar aspect of the right hand.  Unclear if this is related to the recent antibiotic use or and other inflammatory process.  Not suggestive of gout.  No history of similar symptoms. -Counseled to monitor for now. -Provided prednisone to have on hand if needed. -May need consider lab work if ongoing.
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
## 2020-02-23 ENCOUNTER — Telehealth: Payer: Self-pay

## 2020-02-23 NOTE — Telephone Encounter (Signed)
PA initiated via Covermymeds; KEY: [id number]. PA approved. Effective 01/24/2020 to 02/22/2021.

## 2020-03-21 ENCOUNTER — Ambulatory Visit (HOSPITAL_BASED_OUTPATIENT_CLINIC_OR_DEPARTMENT_OTHER)
Admission: RE | Admit: 2020-03-21 | Discharge: 2020-03-21 | Disposition: A | Discharge status: Home / Self Care | Payer: Medicare Other | Source: Ambulatory Visit | Attending: Family Medicine | Admitting: Family Medicine

## 2020-03-21 ENCOUNTER — Other Ambulatory Visit: Payer: Self-pay

## 2020-03-21 ENCOUNTER — Ambulatory Visit (INDEPENDENT_AMBULATORY_CARE_PROVIDER_SITE_OTHER): Payer: Medicare Other | Admitting: Family Medicine

## 2020-03-21 ENCOUNTER — Encounter: Payer: Self-pay | Admitting: Family Medicine

## 2020-03-21 VITALS — BP 137/84 | HR 88 | Ht 68.0 in | Wt 225.0 lb

## 2020-03-21 DIAGNOSIS — M7541 Impingement syndrome of right shoulder: Secondary | ICD-10-CM | POA: Insufficient documentation

## 2020-03-21 DIAGNOSIS — M19011 Primary osteoarthritis, right shoulder: Secondary | ICD-10-CM | POA: Diagnosis not present

## 2020-03-21 MED ORDER — CELECOXIB 200 MG PO CAPS: 200.0000 mg | ORAL_CAPSULE | Freq: Two times a day (BID) | ORAL | 1 refills | Status: DC | PRN

## 2020-03-21 MED ORDER — KETOROLAC TROMETHAMINE 30 MG/ML IJ SOLN
30.0000 mg | Freq: Once | INTRAMUSCULAR | Status: AC
  Administered 2020-03-21: 30 mg via INTRAMUSCULAR
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
## 2020-03-21 NOTE — Patient Instructions (Signed)
Good to see you Please try ice  Please try the celebrex  Please call [facility] to set up the MRI. Call in a couple of days.  [phone] Please send me a message in MyChart with any questions or updates.  We will set up a virtual visit once the MRI is resulted.   --Dr. Raeford Razor

## 2020-03-22 ENCOUNTER — Telehealth: Payer: Self-pay | Admitting: Family Medicine

## 2020-03-22 NOTE — Telephone Encounter (Signed)
Informed of results.   Rosemarie Ax, MD [facility] 03/22/2020, 2:54 PM

## 2020-03-22 NOTE — Assessment & Plan Note (Addendum)
Limited improvement with previous subacromial injection.  May be more capsular in nature mixture of both. -IM Toradol -Counseled on home exercise therapy and supportive care. -Celebrex. -X-ray -MRI to evaluate for capsulitis versus labral pathology

## 2020-04-09 DIAGNOSIS — M069 Rheumatoid arthritis, unspecified: Secondary | ICD-10-CM | POA: Insufficient documentation

## 2020-04-09 HISTORY — DX: Rheumatoid arthritis, unspecified: M06.9

## 2020-04-12 ENCOUNTER — Ambulatory Visit
Admission: RE | Admit: 2020-04-12 | Discharge: 2020-04-12 | Disposition: A | Discharge status: Home / Self Care | Payer: Federal, State, Local not specified - PPO | Source: Ambulatory Visit | Attending: Family Medicine | Admitting: Family Medicine

## 2020-04-12 ENCOUNTER — Other Ambulatory Visit: Payer: Self-pay

## 2020-04-12 DIAGNOSIS — M25511 Pain in right shoulder: Secondary | ICD-10-CM | POA: Diagnosis not present

## 2020-04-12 DIAGNOSIS — M7541 Impingement syndrome of right shoulder: Secondary | ICD-10-CM

## 2020-04-16 ENCOUNTER — Other Ambulatory Visit: Payer: Self-pay

## 2020-04-16 ENCOUNTER — Encounter: Payer: Self-pay | Admitting: Family Medicine

## 2020-04-16 ENCOUNTER — Telehealth (INDEPENDENT_AMBULATORY_CARE_PROVIDER_SITE_OTHER): Payer: Medicare Other | Admitting: Family Medicine

## 2020-04-16 DIAGNOSIS — M19012 Primary osteoarthritis, left shoulder: Secondary | ICD-10-CM | POA: Diagnosis not present

## 2020-04-16 DIAGNOSIS — M7541 Impingement syndrome of right shoulder: Secondary | ICD-10-CM

## 2020-04-16 MED ORDER — NITROGLYCERIN 0.2 MG/HR TD PT24: MEDICATED_PATCH | TRANSDERMAL | 11 refills | Status: DC

## 2020-04-16 NOTE — Progress Notes (Addendum)
Virtual Visit via Video Note  I connected with Judy [name] on 04/16/20 at  8:10 AM EDT by a video enabled telemedicine application and verified that I am speaking with the correct person using two identifiers.   I discussed the limitations of evaluation and management by telemedicine and the availability of in person appointments. The patient expressed understanding and agreed to proceed.  Physician: office  Patient: home   History of Present Illness:  Judy [name] is a 69 year old female that is following up for right shoulder MRI.  It showed some AC joint arthropathy as well as calcific changes of the footprint of the supraspinatus.  The labrum was intact with no discrete tears.   Observations/Objective:  Gen: NAD, alert, cooperative with exam, well-appearing  Assessment and Plan:  Primary osteoarthritis of left shoulder: Having some worsening pain on the left side.  Initially presented more of a capsulitis but improved significantly with the joint injection to suggest more degenerative changes as the origin. -Counseled on home exercise therapy and supportive care. -Referral to physical therapy. -Has a history of MRI from 2015 that shows a rotator cuff tear.  Subacromial impingement, right: Have worsening of the right shoulder pain after the left shoulder was infected.  Tried a subacromial injection with little improvement.  She says it feels similar to the left.  May have more of a degenerative joint involvement.  Could be more of the [facility] joint. -Counseled on home exercise therapy and supportive care. -Nitro patches for the calcific change. -Referral to physical therapy. -Could consider joint injection.  Follow Up Instructions:    I discussed the assessment and treatment plan with the patient. The patient was provided an opportunity to ask questions and all were answered. The patient agreed with the plan and demonstrated an understanding of the instructions.   The patient was  advised to call back or seek an in-person evaluation if the symptoms worsen or if the condition fails to improve as anticipated.   Clearance Coots, MD

## 2020-04-16 NOTE — Assessment & Plan Note (Signed)
Have worsening of the right shoulder pain after the left shoulder was infected.  Tried a subacromial injection with little improvement.  She says it feels similar to the left.  May have more of a degenerative joint involvement.  Could be more of the [facility] joint. -Counseled on home exercise therapy and supportive care. -Nitro patches for the calcific change. -Referral to physical therapy. -Could consider joint injection.

## 2020-04-16 NOTE — Assessment & Plan Note (Signed)
Having some worsening pain on the left side.  Initially presented more of a capsulitis but improved significantly with the joint injection to suggest more degenerative changes as the origin. -Counseled on home exercise therapy and supportive care. -Referral to physical therapy. -Has a history of MRI from 2015 that shows a rotator cuff tear.

## 2020-04-17 ENCOUNTER — Ambulatory Visit: Payer: Medicare Other | Attending: Family Medicine | Admitting: Physical Therapy

## 2020-04-17 ENCOUNTER — Encounter: Payer: Self-pay | Admitting: Physical Therapy

## 2020-04-17 ENCOUNTER — Other Ambulatory Visit: Payer: Self-pay

## 2020-04-17 DIAGNOSIS — R29898 Other symptoms and signs involving the musculoskeletal system: Secondary | ICD-10-CM | POA: Insufficient documentation

## 2020-04-17 DIAGNOSIS — G8929 Other chronic pain: Secondary | ICD-10-CM | POA: Insufficient documentation

## 2020-04-17 DIAGNOSIS — M25612 Stiffness of left shoulder, not elsewhere classified: Secondary | ICD-10-CM

## 2020-04-17 DIAGNOSIS — M25511 Pain in right shoulder: Secondary | ICD-10-CM | POA: Diagnosis not present

## 2020-04-17 DIAGNOSIS — R293 Abnormal posture: Secondary | ICD-10-CM | POA: Diagnosis not present

## 2020-04-17 DIAGNOSIS — M25512 Pain in left shoulder: Secondary | ICD-10-CM | POA: Insufficient documentation

## 2020-04-17 DIAGNOSIS — M25611 Stiffness of right shoulder, not elsewhere classified: Secondary | ICD-10-CM | POA: Diagnosis not present
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
## 2020-04-24 ENCOUNTER — Encounter: Payer: Self-pay | Admitting: Physical Therapy

## 2020-04-24 ENCOUNTER — Other Ambulatory Visit: Payer: Self-pay

## 2020-04-24 ENCOUNTER — Ambulatory Visit: Payer: Medicare Other | Admitting: Physical Therapy

## 2020-04-24 DIAGNOSIS — G8929 Other chronic pain: Secondary | ICD-10-CM

## 2020-04-24 DIAGNOSIS — M25512 Pain in left shoulder: Secondary | ICD-10-CM | POA: Diagnosis not present

## 2020-04-24 DIAGNOSIS — M25612 Stiffness of left shoulder, not elsewhere classified: Secondary | ICD-10-CM

## 2020-04-24 DIAGNOSIS — M25611 Stiffness of right shoulder, not elsewhere classified: Secondary | ICD-10-CM

## 2020-04-24 DIAGNOSIS — R293 Abnormal posture: Secondary | ICD-10-CM | POA: Diagnosis not present

## 2020-04-24 DIAGNOSIS — M25511 Pain in right shoulder: Secondary | ICD-10-CM | POA: Diagnosis not present

## 2020-04-24 DIAGNOSIS — R29898 Other symptoms and signs involving the musculoskeletal system: Secondary | ICD-10-CM
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
## 2020-04-26 ENCOUNTER — Other Ambulatory Visit: Payer: Self-pay

## 2020-04-26 ENCOUNTER — Encounter: Payer: Self-pay | Admitting: Physical Therapy

## 2020-04-26 ENCOUNTER — Ambulatory Visit: Payer: Medicare Other | Admitting: Physical Therapy

## 2020-04-26 DIAGNOSIS — M25511 Pain in right shoulder: Secondary | ICD-10-CM | POA: Diagnosis not present

## 2020-04-26 DIAGNOSIS — R293 Abnormal posture: Secondary | ICD-10-CM

## 2020-04-26 DIAGNOSIS — M25612 Stiffness of left shoulder, not elsewhere classified: Secondary | ICD-10-CM

## 2020-04-26 DIAGNOSIS — M25611 Stiffness of right shoulder, not elsewhere classified: Secondary | ICD-10-CM

## 2020-04-26 DIAGNOSIS — G8929 Other chronic pain: Secondary | ICD-10-CM

## 2020-04-26 DIAGNOSIS — M25512 Pain in left shoulder: Secondary | ICD-10-CM | POA: Diagnosis not present

## 2020-04-26 DIAGNOSIS — R29898 Other symptoms and signs involving the musculoskeletal system: Secondary | ICD-10-CM
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
## 2020-05-01 ENCOUNTER — Encounter: Payer: Self-pay | Admitting: Family Medicine

## 2020-05-01 ENCOUNTER — Other Ambulatory Visit: Payer: Self-pay

## 2020-05-01 ENCOUNTER — Encounter: Payer: Self-pay | Admitting: Physical Therapy

## 2020-05-01 ENCOUNTER — Ambulatory Visit: Payer: Medicare Other | Admitting: Physical Therapy

## 2020-05-01 DIAGNOSIS — M25612 Stiffness of left shoulder, not elsewhere classified: Secondary | ICD-10-CM | POA: Diagnosis not present

## 2020-05-01 DIAGNOSIS — G8929 Other chronic pain: Secondary | ICD-10-CM | POA: Diagnosis not present

## 2020-05-01 DIAGNOSIS — M25511 Pain in right shoulder: Secondary | ICD-10-CM | POA: Diagnosis not present

## 2020-05-01 DIAGNOSIS — R29898 Other symptoms and signs involving the musculoskeletal system: Secondary | ICD-10-CM

## 2020-05-01 DIAGNOSIS — M25611 Stiffness of right shoulder, not elsewhere classified: Secondary | ICD-10-CM

## 2020-05-01 DIAGNOSIS — M25512 Pain in left shoulder: Secondary | ICD-10-CM | POA: Diagnosis not present

## 2020-05-01 DIAGNOSIS — R293 Abnormal posture: Secondary | ICD-10-CM
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
## 2020-05-02 ENCOUNTER — Other Ambulatory Visit: Payer: Self-pay | Admitting: Family Medicine

## 2020-05-02 DIAGNOSIS — M25512 Pain in left shoulder: Secondary | ICD-10-CM

## 2020-05-02 DIAGNOSIS — M170 Bilateral primary osteoarthritis of knee: Secondary | ICD-10-CM

## 2020-05-02 DIAGNOSIS — M19012 Primary osteoarthritis, left shoulder: Secondary | ICD-10-CM

## 2020-05-02 HISTORY — DX: Pain in left shoulder: M25.512

## 2020-05-02 NOTE — Progress Notes (Signed)
Inflammatory labs to be checked.   Rosemarie Ax, MD [facility] 05/02/2020, 8:51 AM

## 2020-05-03 ENCOUNTER — Ambulatory Visit: Payer: Medicare Other | Admitting: Physical Therapy

## 2020-05-03 ENCOUNTER — Encounter: Payer: Self-pay | Admitting: Physical Therapy

## 2020-05-03 ENCOUNTER — Other Ambulatory Visit: Payer: Self-pay

## 2020-05-03 DIAGNOSIS — G8929 Other chronic pain: Secondary | ICD-10-CM

## 2020-05-03 DIAGNOSIS — R29898 Other symptoms and signs involving the musculoskeletal system: Secondary | ICD-10-CM

## 2020-05-03 DIAGNOSIS — M25611 Stiffness of right shoulder, not elsewhere classified: Secondary | ICD-10-CM | POA: Diagnosis not present

## 2020-05-03 DIAGNOSIS — M25511 Pain in right shoulder: Secondary | ICD-10-CM | POA: Diagnosis not present

## 2020-05-03 DIAGNOSIS — R293 Abnormal posture: Secondary | ICD-10-CM

## 2020-05-03 DIAGNOSIS — M25512 Pain in left shoulder: Secondary | ICD-10-CM

## 2020-05-03 DIAGNOSIS — M25612 Stiffness of left shoulder, not elsewhere classified: Secondary | ICD-10-CM | POA: Diagnosis not present
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
## 2020-05-04 ENCOUNTER — Telehealth: Payer: Self-pay | Admitting: Family Medicine

## 2020-05-04 ENCOUNTER — Other Ambulatory Visit: Payer: Self-pay | Admitting: Medical

## 2020-05-04 DIAGNOSIS — R768 Other specified abnormal immunological findings in serum: Secondary | ICD-10-CM

## 2020-05-04 DIAGNOSIS — I10 Essential (primary) hypertension: Secondary | ICD-10-CM

## 2020-05-04 LAB — URIC ACID: Uric Acid: 4.4 mg/dL (ref 3.0–7.2)

## 2020-05-04 LAB — C-REACTIVE PROTEIN: CRP: 13 mg/L — ABNORMAL HIGH (ref 0–10)

## 2020-05-04 LAB — ANA,IFA RA DIAG PNL W/RFLX TIT/PATN
ANA Titer 1: NEGATIVE
Cyclic Citrullin Peptide Ab: >250 units — ABNORMAL HIGH (ref 0–19)
Rheumatoid fact SerPl-aCnc: 43.5 IU/mL — ABNORMAL HIGH (ref 0.0–13.9)

## 2020-05-04 LAB — SEDIMENTATION RATE: Sed Rate: 32 mm/hr (ref 0–40)

## 2020-05-04 NOTE — Telephone Encounter (Signed)
Elevated anti-ccp. Will refer to rheumatology   Rosemarie Ax, MD [facility] 05/04/2020, 8:15 AM

## 2020-05-08 ENCOUNTER — Other Ambulatory Visit: Payer: Self-pay | Admitting: Family Medicine

## 2020-05-08 DIAGNOSIS — R768 Other specified abnormal immunological findings in serum: Secondary | ICD-10-CM

## 2020-05-09 ENCOUNTER — Encounter: Payer: Self-pay | Admitting: Physical Therapy

## 2020-05-09 ENCOUNTER — Other Ambulatory Visit: Payer: Self-pay

## 2020-05-09 ENCOUNTER — Ambulatory Visit: Payer: Medicare Other | Attending: Family Medicine | Admitting: Physical Therapy

## 2020-05-09 DIAGNOSIS — R29898 Other symptoms and signs involving the musculoskeletal system: Secondary | ICD-10-CM | POA: Diagnosis present

## 2020-05-09 DIAGNOSIS — M25612 Stiffness of left shoulder, not elsewhere classified: Secondary | ICD-10-CM | POA: Diagnosis present

## 2020-05-09 DIAGNOSIS — R293 Abnormal posture: Secondary | ICD-10-CM | POA: Diagnosis present

## 2020-05-09 DIAGNOSIS — M25511 Pain in right shoulder: Secondary | ICD-10-CM | POA: Insufficient documentation

## 2020-05-09 DIAGNOSIS — M25611 Stiffness of right shoulder, not elsewhere classified: Secondary | ICD-10-CM | POA: Diagnosis present

## 2020-05-09 DIAGNOSIS — G8929 Other chronic pain: Secondary | ICD-10-CM

## 2020-05-09 DIAGNOSIS — M25512 Pain in left shoulder: Secondary | ICD-10-CM | POA: Diagnosis present
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
## 2020-05-10 ENCOUNTER — Encounter: Payer: Self-pay | Admitting: Family Medicine

## 2020-05-11 ENCOUNTER — Encounter: Payer: Self-pay | Admitting: Physical Therapy

## 2020-05-11 ENCOUNTER — Ambulatory Visit: Payer: Medicare Other | Admitting: Physical Therapy

## 2020-05-11 ENCOUNTER — Other Ambulatory Visit: Payer: Self-pay

## 2020-05-11 DIAGNOSIS — M25612 Stiffness of left shoulder, not elsewhere classified: Secondary | ICD-10-CM

## 2020-05-11 DIAGNOSIS — M25611 Stiffness of right shoulder, not elsewhere classified: Secondary | ICD-10-CM

## 2020-05-11 DIAGNOSIS — M25511 Pain in right shoulder: Secondary | ICD-10-CM | POA: Diagnosis not present

## 2020-05-11 DIAGNOSIS — G8929 Other chronic pain: Secondary | ICD-10-CM

## 2020-05-11 DIAGNOSIS — M25512 Pain in left shoulder: Secondary | ICD-10-CM

## 2020-05-11 DIAGNOSIS — R293 Abnormal posture: Secondary | ICD-10-CM

## 2020-05-11 DIAGNOSIS — R29898 Other symptoms and signs involving the musculoskeletal system: Secondary | ICD-10-CM
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
## 2020-05-14 ENCOUNTER — Other Ambulatory Visit: Payer: Self-pay | Admitting: Family Medicine

## 2020-05-14 ENCOUNTER — Other Ambulatory Visit: Payer: Self-pay

## 2020-05-14 ENCOUNTER — Ambulatory Visit: Payer: Medicare Other

## 2020-05-14 DIAGNOSIS — M25511 Pain in right shoulder: Secondary | ICD-10-CM | POA: Diagnosis not present

## 2020-05-14 DIAGNOSIS — G8929 Other chronic pain: Secondary | ICD-10-CM

## 2020-05-14 DIAGNOSIS — M25611 Stiffness of right shoulder, not elsewhere classified: Secondary | ICD-10-CM

## 2020-05-14 DIAGNOSIS — R29898 Other symptoms and signs involving the musculoskeletal system: Secondary | ICD-10-CM

## 2020-05-14 DIAGNOSIS — R293 Abnormal posture: Secondary | ICD-10-CM

## 2020-05-14 DIAGNOSIS — R768 Other specified abnormal immunological findings in serum: Secondary | ICD-10-CM

## 2020-05-14 DIAGNOSIS — M25512 Pain in left shoulder: Secondary | ICD-10-CM

## 2020-05-14 DIAGNOSIS — M25612 Stiffness of left shoulder, not elsewhere classified: Secondary | ICD-10-CM
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
## 2020-05-15 ENCOUNTER — Ambulatory Visit: Payer: Medicare Other

## 2020-05-15 ENCOUNTER — Other Ambulatory Visit: Payer: Self-pay | Admitting: Family Medicine

## 2020-05-15 DIAGNOSIS — R768 Other specified abnormal immunological findings in serum: Secondary | ICD-10-CM

## 2020-05-20 ENCOUNTER — Other Ambulatory Visit: Payer: Self-pay | Admitting: Medical

## 2020-05-20 DIAGNOSIS — K219 Gastro-esophageal reflux disease without esophagitis: Secondary | ICD-10-CM

## 2020-05-21 ENCOUNTER — Ambulatory Visit: Payer: Medicare Other | Admitting: Physical Therapy

## 2020-05-21 ENCOUNTER — Encounter: Payer: Self-pay | Admitting: Physical Therapy

## 2020-05-21 ENCOUNTER — Other Ambulatory Visit: Payer: Self-pay

## 2020-05-21 DIAGNOSIS — R29898 Other symptoms and signs involving the musculoskeletal system: Secondary | ICD-10-CM

## 2020-05-21 DIAGNOSIS — M25511 Pain in right shoulder: Secondary | ICD-10-CM

## 2020-05-21 DIAGNOSIS — R293 Abnormal posture: Secondary | ICD-10-CM

## 2020-05-21 DIAGNOSIS — M25611 Stiffness of right shoulder, not elsewhere classified: Secondary | ICD-10-CM

## 2020-05-21 DIAGNOSIS — G8929 Other chronic pain: Secondary | ICD-10-CM

## 2020-05-21 DIAGNOSIS — M25612 Stiffness of left shoulder, not elsewhere classified: Secondary | ICD-10-CM
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
## 2020-05-24 ENCOUNTER — Other Ambulatory Visit: Payer: Self-pay

## 2020-05-24 ENCOUNTER — Encounter: Payer: Self-pay | Admitting: Physical Therapy

## 2020-05-24 ENCOUNTER — Ambulatory Visit: Payer: Medicare Other | Admitting: Physical Therapy

## 2020-05-24 DIAGNOSIS — R293 Abnormal posture: Secondary | ICD-10-CM

## 2020-05-24 DIAGNOSIS — M25612 Stiffness of left shoulder, not elsewhere classified: Secondary | ICD-10-CM

## 2020-05-24 DIAGNOSIS — M25511 Pain in right shoulder: Secondary | ICD-10-CM | POA: Diagnosis not present

## 2020-05-24 DIAGNOSIS — R29898 Other symptoms and signs involving the musculoskeletal system: Secondary | ICD-10-CM

## 2020-05-24 DIAGNOSIS — G8929 Other chronic pain: Secondary | ICD-10-CM

## 2020-05-24 DIAGNOSIS — M25611 Stiffness of right shoulder, not elsewhere classified: Secondary | ICD-10-CM

## 2020-05-24 DIAGNOSIS — M25512 Pain in left shoulder: Secondary | ICD-10-CM
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
## 2020-05-28 ENCOUNTER — Encounter: Payer: Self-pay | Admitting: Physical Therapy

## 2020-05-28 ENCOUNTER — Ambulatory Visit: Payer: Medicare Other | Admitting: Physical Therapy

## 2020-05-28 ENCOUNTER — Other Ambulatory Visit: Payer: Self-pay

## 2020-05-28 DIAGNOSIS — M25612 Stiffness of left shoulder, not elsewhere classified: Secondary | ICD-10-CM

## 2020-05-28 DIAGNOSIS — M25512 Pain in left shoulder: Secondary | ICD-10-CM

## 2020-05-28 DIAGNOSIS — M25511 Pain in right shoulder: Secondary | ICD-10-CM | POA: Diagnosis not present

## 2020-05-28 DIAGNOSIS — R29898 Other symptoms and signs involving the musculoskeletal system: Secondary | ICD-10-CM

## 2020-05-28 DIAGNOSIS — G8929 Other chronic pain: Secondary | ICD-10-CM

## 2020-05-28 DIAGNOSIS — M25611 Stiffness of right shoulder, not elsewhere classified: Secondary | ICD-10-CM

## 2020-05-28 DIAGNOSIS — R293 Abnormal posture: Secondary | ICD-10-CM
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
## 2020-05-30 ENCOUNTER — Encounter: Payer: Self-pay | Admitting: Physical Therapy

## 2020-05-30 ENCOUNTER — Ambulatory Visit: Payer: Medicare Other | Admitting: Physical Therapy

## 2020-05-30 ENCOUNTER — Other Ambulatory Visit: Payer: Self-pay

## 2020-05-30 DIAGNOSIS — M25611 Stiffness of right shoulder, not elsewhere classified: Secondary | ICD-10-CM

## 2020-05-30 DIAGNOSIS — M25511 Pain in right shoulder: Secondary | ICD-10-CM | POA: Diagnosis not present

## 2020-05-30 DIAGNOSIS — G8929 Other chronic pain: Secondary | ICD-10-CM

## 2020-05-30 DIAGNOSIS — R29898 Other symptoms and signs involving the musculoskeletal system: Secondary | ICD-10-CM

## 2020-05-30 DIAGNOSIS — R293 Abnormal posture: Secondary | ICD-10-CM

## 2020-05-30 DIAGNOSIS — M25612 Stiffness of left shoulder, not elsewhere classified: Secondary | ICD-10-CM
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
## 2020-06-01 DIAGNOSIS — R5383 Other fatigue: Secondary | ICD-10-CM | POA: Diagnosis not present

## 2020-06-01 DIAGNOSIS — M0579 Rheumatoid arthritis with rheumatoid factor of multiple sites without organ or systems involvement: Secondary | ICD-10-CM | POA: Diagnosis not present

## 2020-06-04 ENCOUNTER — Ambulatory Visit: Payer: Medicare Other | Admitting: Physical Therapy

## 2020-06-04 ENCOUNTER — Encounter: Payer: Self-pay | Admitting: Physical Therapy

## 2020-06-04 ENCOUNTER — Other Ambulatory Visit: Payer: Self-pay

## 2020-06-04 ENCOUNTER — Encounter: Payer: Self-pay | Admitting: Family Medicine

## 2020-06-04 DIAGNOSIS — R29898 Other symptoms and signs involving the musculoskeletal system: Secondary | ICD-10-CM

## 2020-06-04 DIAGNOSIS — M25511 Pain in right shoulder: Secondary | ICD-10-CM | POA: Diagnosis not present

## 2020-06-04 DIAGNOSIS — M25612 Stiffness of left shoulder, not elsewhere classified: Secondary | ICD-10-CM

## 2020-06-04 DIAGNOSIS — M25512 Pain in left shoulder: Secondary | ICD-10-CM

## 2020-06-04 DIAGNOSIS — G8929 Other chronic pain: Secondary | ICD-10-CM

## 2020-06-04 DIAGNOSIS — M25611 Stiffness of right shoulder, not elsewhere classified: Secondary | ICD-10-CM

## 2020-06-04 DIAGNOSIS — R293 Abnormal posture: Secondary | ICD-10-CM
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
## 2020-06-05 ENCOUNTER — Encounter: Payer: Self-pay | Admitting: Internal Medicine

## 2020-06-06 ENCOUNTER — Ambulatory Visit: Payer: Medicare Other | Admitting: Physical Therapy

## 2020-06-06 ENCOUNTER — Other Ambulatory Visit: Payer: Self-pay

## 2020-06-06 ENCOUNTER — Encounter: Payer: Self-pay | Admitting: Physical Therapy

## 2020-06-06 DIAGNOSIS — M25511 Pain in right shoulder: Secondary | ICD-10-CM | POA: Diagnosis not present

## 2020-06-06 DIAGNOSIS — M25611 Stiffness of right shoulder, not elsewhere classified: Secondary | ICD-10-CM

## 2020-06-06 DIAGNOSIS — G8929 Other chronic pain: Secondary | ICD-10-CM

## 2020-06-06 DIAGNOSIS — R29898 Other symptoms and signs involving the musculoskeletal system: Secondary | ICD-10-CM

## 2020-06-06 DIAGNOSIS — M25612 Stiffness of left shoulder, not elsewhere classified: Secondary | ICD-10-CM

## 2020-06-06 DIAGNOSIS — M25512 Pain in left shoulder: Secondary | ICD-10-CM

## 2020-06-06 DIAGNOSIS — R293 Abnormal posture: Secondary | ICD-10-CM
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
## 2020-06-12 ENCOUNTER — Ambulatory Visit: Payer: Medicare Other | Attending: Family Medicine | Admitting: Physical Therapy

## 2020-06-12 ENCOUNTER — Encounter: Payer: Self-pay | Admitting: Physical Therapy

## 2020-06-12 ENCOUNTER — Other Ambulatory Visit: Payer: Self-pay

## 2020-06-12 DIAGNOSIS — M25612 Stiffness of left shoulder, not elsewhere classified: Secondary | ICD-10-CM | POA: Diagnosis not present

## 2020-06-12 DIAGNOSIS — R29898 Other symptoms and signs involving the musculoskeletal system: Secondary | ICD-10-CM | POA: Diagnosis not present

## 2020-06-12 DIAGNOSIS — M25511 Pain in right shoulder: Secondary | ICD-10-CM | POA: Insufficient documentation

## 2020-06-12 DIAGNOSIS — M25512 Pain in left shoulder: Secondary | ICD-10-CM | POA: Diagnosis not present

## 2020-06-12 DIAGNOSIS — M25611 Stiffness of right shoulder, not elsewhere classified: Secondary | ICD-10-CM | POA: Insufficient documentation

## 2020-06-12 DIAGNOSIS — R293 Abnormal posture: Secondary | ICD-10-CM | POA: Insufficient documentation

## 2020-06-12 DIAGNOSIS — G8929 Other chronic pain: Secondary | ICD-10-CM | POA: Insufficient documentation
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
## 2020-07-06 ENCOUNTER — Other Ambulatory Visit: Payer: Self-pay | Admitting: Medical

## 2020-07-06 DIAGNOSIS — I10 Essential (primary) hypertension: Secondary | ICD-10-CM

## 2020-08-01 DIAGNOSIS — M7989 Other specified soft tissue disorders: Secondary | ICD-10-CM | POA: Diagnosis not present

## 2020-08-01 DIAGNOSIS — Z6833 Body mass index (BMI) 33.0-33.9, adult: Secondary | ICD-10-CM | POA: Diagnosis not present

## 2020-08-01 DIAGNOSIS — E669 Obesity, unspecified: Secondary | ICD-10-CM | POA: Diagnosis not present

## 2020-08-01 DIAGNOSIS — R700 Elevated erythrocyte sedimentation rate: Secondary | ICD-10-CM | POA: Diagnosis not present

## 2020-08-01 DIAGNOSIS — M5030 Other cervical disc degeneration, unspecified cervical region: Secondary | ICD-10-CM | POA: Diagnosis not present

## 2020-08-01 DIAGNOSIS — M79641 Pain in right hand: Secondary | ICD-10-CM | POA: Diagnosis not present

## 2020-08-01 DIAGNOSIS — M0579 Rheumatoid arthritis with rheumatoid factor of multiple sites without organ or systems involvement: Secondary | ICD-10-CM | POA: Diagnosis not present

## 2020-08-01 DIAGNOSIS — R768 Other specified abnormal immunological findings in serum: Secondary | ICD-10-CM | POA: Diagnosis not present

## 2020-08-01 DIAGNOSIS — Z79899 Other long term (current) drug therapy: Secondary | ICD-10-CM | POA: Diagnosis not present

## 2020-08-14 ENCOUNTER — Other Ambulatory Visit (HOSPITAL_BASED_OUTPATIENT_CLINIC_OR_DEPARTMENT_OTHER): Payer: Self-pay | Admitting: Medical

## 2020-08-14 DIAGNOSIS — Z1231 Encounter for screening mammogram for malignant neoplasm of breast: Secondary | ICD-10-CM

## 2020-08-22 ENCOUNTER — Ambulatory Visit (HOSPITAL_BASED_OUTPATIENT_CLINIC_OR_DEPARTMENT_OTHER): Payer: Medicare Other

## 2020-08-24 ENCOUNTER — Ambulatory Visit (HOSPITAL_BASED_OUTPATIENT_CLINIC_OR_DEPARTMENT_OTHER): Payer: Medicare Other

## 2020-08-27 ENCOUNTER — Other Ambulatory Visit: Payer: Self-pay

## 2020-08-27 ENCOUNTER — Encounter (HOSPITAL_BASED_OUTPATIENT_CLINIC_OR_DEPARTMENT_OTHER): Payer: Self-pay

## 2020-08-27 ENCOUNTER — Ambulatory Visit (HOSPITAL_BASED_OUTPATIENT_CLINIC_OR_DEPARTMENT_OTHER)
Admission: RE | Admit: 2020-08-27 | Discharge: 2020-08-27 | Disposition: A | Discharge status: Home / Self Care | Payer: Medicare Other | Source: Ambulatory Visit | Attending: Medical | Admitting: Medical

## 2020-08-27 ENCOUNTER — Ambulatory Visit (INDEPENDENT_AMBULATORY_CARE_PROVIDER_SITE_OTHER): Payer: Medicare Other | Admitting: Medical

## 2020-08-27 ENCOUNTER — Encounter: Payer: Self-pay | Admitting: Medical

## 2020-08-27 VITALS — BP 119/65 | HR 93 | Resp 18 | Ht 68.0 in | Wt 222.2 lb

## 2020-08-27 DIAGNOSIS — R079 Chest pain, unspecified: Secondary | ICD-10-CM

## 2020-08-27 DIAGNOSIS — R002 Palpitations: Secondary | ICD-10-CM

## 2020-08-27 DIAGNOSIS — Z1231 Encounter for screening mammogram for malignant neoplasm of breast: Secondary | ICD-10-CM

## 2020-08-27 DIAGNOSIS — M25519 Pain in unspecified shoulder: Secondary | ICD-10-CM

## 2020-08-27 MED ORDER — NEOMYCIN-POLYMYXIN-HC 3.5-10000-1 OT SOLN: 3.0000 [drp] | Freq: Four times a day (QID) | OTIC | 0 refills | Status: DC

## 2020-08-27 NOTE — Patient Instructions (Addendum)
For recent palpitation but non presently recommend cut out caffeine or otc stimulants/sudafred. ekg showed nsr.  Will go ahead and place referral to cardiologist. If any recurrent brief palpation before we can see if available appointments or be seen in ED more severe symptoms..  No labs indicated today.  For rt shoulder pain minimal now and only on palpation can use otc salon pas lidocaine patch.  For rt ear pain residual now and only on exam will rx cortisporin otic drops.   Follow up in 2-3 weeks or needed.
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
## 2020-09-17 ENCOUNTER — Other Ambulatory Visit: Payer: Self-pay

## 2020-09-17 DIAGNOSIS — E669 Obesity, unspecified: Secondary | ICD-10-CM | POA: Insufficient documentation

## 2020-09-17 DIAGNOSIS — E6609 Other obesity due to excess calories: Secondary | ICD-10-CM | POA: Insufficient documentation

## 2020-09-17 DIAGNOSIS — Z9289 Personal history of other medical treatment: Secondary | ICD-10-CM | POA: Insufficient documentation

## 2020-09-17 DIAGNOSIS — G4730 Sleep apnea, unspecified: Secondary | ICD-10-CM | POA: Insufficient documentation

## 2020-09-17 DIAGNOSIS — I10 Essential (primary) hypertension: Secondary | ICD-10-CM | POA: Insufficient documentation

## 2020-09-17 DIAGNOSIS — N879 Dysplasia of cervix uteri, unspecified: Secondary | ICD-10-CM | POA: Insufficient documentation

## 2020-09-17 DIAGNOSIS — D649 Anemia, unspecified: Secondary | ICD-10-CM | POA: Insufficient documentation

## 2020-09-17 DIAGNOSIS — R0602 Shortness of breath: Secondary | ICD-10-CM | POA: Insufficient documentation

## 2020-09-17 DIAGNOSIS — M549 Dorsalgia, unspecified: Secondary | ICD-10-CM | POA: Insufficient documentation

## 2020-09-18 ENCOUNTER — Encounter: Payer: Self-pay | Admitting: Cardiology

## 2020-09-18 ENCOUNTER — Ambulatory Visit (INDEPENDENT_AMBULATORY_CARE_PROVIDER_SITE_OTHER): Payer: Medicare Other

## 2020-09-18 ENCOUNTER — Ambulatory Visit (INDEPENDENT_AMBULATORY_CARE_PROVIDER_SITE_OTHER): Payer: Medicare Other | Admitting: Cardiology

## 2020-09-18 ENCOUNTER — Other Ambulatory Visit: Payer: Self-pay

## 2020-09-18 DIAGNOSIS — R002 Palpitations: Secondary | ICD-10-CM

## 2020-09-18 DIAGNOSIS — E782 Mixed hyperlipidemia: Secondary | ICD-10-CM

## 2020-09-18 DIAGNOSIS — R011 Cardiac murmur, unspecified: Secondary | ICD-10-CM | POA: Diagnosis not present

## 2020-09-18 DIAGNOSIS — I119 Hypertensive heart disease without heart failure: Secondary | ICD-10-CM

## 2020-09-18 DIAGNOSIS — I10 Essential (primary) hypertension: Secondary | ICD-10-CM

## 2020-09-18 HISTORY — DX: Essential (primary) hypertension: I10

## 2020-09-18 HISTORY — DX: Cardiac murmur, unspecified: R01.1

## 2020-09-18 HISTORY — DX: Palpitations: R00.2

## 2020-09-18 HISTORY — DX: Mixed hyperlipidemia: E78.2

## 2020-09-18 NOTE — Patient Instructions (Signed)
Medication Instructions:  No medication changes. *If you need a refill on your cardiac medications before your next appointment, please call your pharmacy*   Lab Work: None ordered If you have labs (blood work) drawn today and your tests are completely normal, you will receive your results only by: Marland Kitchen MyChart Message (if you have MyChart) OR . A paper copy in the mail If you have any lab test that is abnormal or we need to change your treatment, we will call you to review the results.   Testing/Procedures: Your physician has requested that you have an echocardiogram. Echocardiography is a painless test that uses sound waves to create images of your heart. It provides your doctor with information about the size and shape of your heart and how well your heart's chambers and valves are working. This procedure takes approximately one hour. There are no restrictions for this procedure.  We will order CT coronary calcium score $150  Please call [phone] to schedule   [facility]  [address]  [place], [place] [address]   WHY IS MY DOCTOR PRESCRIBING ZIO? The Zio system is proven and trusted by physicians to detect and diagnose irregular heart rhythms -- and has been prescribed to hundreds of thousands of patients.  The FDA has cleared the Zio system to monitor for many different kinds of irregular heart rhythms. In a study, physicians were able to reach a diagnosis 90% of the time with the Zio system1.  You can wear the Zio monitor -- a small, discreet, comfortable patch -- during your normal day-to-day activity, including while you sleep, shower, and exercise, while it records every single heartbeat for analysis.  1Barrett, P., et al. Comparison of 24 Hour Holter Monitoring Versus 14 Day Novel Adhesive Patch Electrocardiographic Monitoring. [place], 2014.  ZIO VS. HOLTER MONITORING The Zio monitor can be comfortably worn for up to 14 days. Holter  monitors can be worn for 24 to 48 hours, limiting the time to record any irregular heart rhythms you may have. Zio is able to capture data for the 51% of patients who have their first symptom-triggered arrhythmia after 48 hours.1  LIVE WITHOUT RESTRICTIONS The Zio ambulatory cardiac monitor is a small, unobtrusive, and water-resistant patch--you might even forget you're wearing it. The Zio monitor records and stores every beat of your heart, whether you're sleeping, working out, or showering.  Wear the monitor for 2 weeks. Rmove on 10/02/20.  Follow-Up: At [facility], you and your health needs are our priority.  As part of our continuing mission to provide you with exceptional heart care, we have created designated Provider Care Teams.  These Care Teams include your primary Cardiologist (physician) and Advanced Practice Providers (APPs -  Physician Assistants and Nurse Practitioners) who all work together to provide you with the care you need, when you need it.  We recommend signing up for the patient portal called "MyChart".  Sign up information is provided on this After Visit Summary.  MyChart is used to connect with patients for Virtual Visits (Telemedicine).  Patients are able to view lab/test results, encounter notes, upcoming appointments, etc.  Non-urgent messages can be sent to your provider as well.   To learn more about what you can do with MyChart, go to [email or web address].    Your next appointment:   3 month(s)  The format for your next appointment:   In Person  Provider:   Jyl Heinz, MD   Other Instructions NA
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
## 2020-09-26 ENCOUNTER — Ambulatory Visit (INDEPENDENT_AMBULATORY_CARE_PROVIDER_SITE_OTHER)
Admission: RE | Admit: 2020-09-26 | Discharge: 2020-09-26 | Disposition: A | Discharge status: Home / Self Care | Payer: Self-pay | Source: Ambulatory Visit | Attending: Cardiology | Admitting: Cardiology

## 2020-09-26 ENCOUNTER — Other Ambulatory Visit: Payer: Self-pay

## 2020-09-26 DIAGNOSIS — I10 Essential (primary) hypertension: Secondary | ICD-10-CM

## 2020-09-26 DIAGNOSIS — E782 Mixed hyperlipidemia: Secondary | ICD-10-CM

## 2020-09-26 DIAGNOSIS — I119 Hypertensive heart disease without heart failure: Secondary | ICD-10-CM

## 2020-09-27 DIAGNOSIS — Z79899 Other long term (current) drug therapy: Secondary | ICD-10-CM | POA: Diagnosis not present

## 2020-09-27 DIAGNOSIS — M0579 Rheumatoid arthritis with rheumatoid factor of multiple sites without organ or systems involvement: Secondary | ICD-10-CM | POA: Diagnosis not present

## 2020-09-27 DIAGNOSIS — M7989 Other specified soft tissue disorders: Secondary | ICD-10-CM | POA: Diagnosis not present

## 2020-09-27 DIAGNOSIS — R768 Other specified abnormal immunological findings in serum: Secondary | ICD-10-CM | POA: Diagnosis not present

## 2020-09-27 DIAGNOSIS — E669 Obesity, unspecified: Secondary | ICD-10-CM | POA: Diagnosis not present

## 2020-09-27 DIAGNOSIS — R700 Elevated erythrocyte sedimentation rate: Secondary | ICD-10-CM | POA: Diagnosis not present

## 2020-09-27 DIAGNOSIS — Z6833 Body mass index (BMI) 33.0-33.9, adult: Secondary | ICD-10-CM | POA: Diagnosis not present

## 2020-09-27 DIAGNOSIS — M5030 Other cervical disc degeneration, unspecified cervical region: Secondary | ICD-10-CM | POA: Diagnosis not present

## 2020-09-27 DIAGNOSIS — M79641 Pain in right hand: Secondary | ICD-10-CM | POA: Diagnosis not present

## 2020-10-03 ENCOUNTER — Other Ambulatory Visit (HOSPITAL_BASED_OUTPATIENT_CLINIC_OR_DEPARTMENT_OTHER): Payer: Medicare Other

## 2020-10-05 ENCOUNTER — Other Ambulatory Visit (HOSPITAL_BASED_OUTPATIENT_CLINIC_OR_DEPARTMENT_OTHER): Payer: Medicare Other

## 2020-10-08 DIAGNOSIS — M6281 Muscle weakness (generalized): Secondary | ICD-10-CM | POA: Diagnosis not present

## 2020-10-08 DIAGNOSIS — M79644 Pain in right finger(s): Secondary | ICD-10-CM | POA: Diagnosis not present

## 2020-10-08 DIAGNOSIS — M25531 Pain in right wrist: Secondary | ICD-10-CM | POA: Diagnosis not present

## 2020-10-08 DIAGNOSIS — M79641 Pain in right hand: Secondary | ICD-10-CM | POA: Diagnosis not present

## 2020-10-11 ENCOUNTER — Telehealth: Payer: Self-pay

## 2020-10-11 ENCOUNTER — Emergency Department (HOSPITAL_BASED_OUTPATIENT_CLINIC_OR_DEPARTMENT_OTHER): Payer: Medicare Other

## 2020-10-11 ENCOUNTER — Emergency Department (HOSPITAL_BASED_OUTPATIENT_CLINIC_OR_DEPARTMENT_OTHER)
Admission: EM | Admit: 2020-10-11 | Discharge: 2020-10-11 | Disposition: A | Discharge status: Home / Self Care | Payer: Medicare Other | Attending: Emergency Medicine | Admitting: Emergency Medicine

## 2020-10-11 ENCOUNTER — Encounter (HOSPITAL_BASED_OUTPATIENT_CLINIC_OR_DEPARTMENT_OTHER): Payer: Self-pay | Admitting: *Deleted

## 2020-10-11 ENCOUNTER — Other Ambulatory Visit: Payer: Self-pay

## 2020-10-11 DIAGNOSIS — I10 Essential (primary) hypertension: Secondary | ICD-10-CM | POA: Diagnosis not present

## 2020-10-11 DIAGNOSIS — H5712 Ocular pain, left eye: Secondary | ICD-10-CM | POA: Diagnosis not present

## 2020-10-11 DIAGNOSIS — G529 Cranial nerve disorder, unspecified: Secondary | ICD-10-CM | POA: Insufficient documentation

## 2020-10-11 DIAGNOSIS — Z86711 Personal history of pulmonary embolism: Secondary | ICD-10-CM | POA: Diagnosis not present

## 2020-10-11 DIAGNOSIS — Z79899 Other long term (current) drug therapy: Secondary | ICD-10-CM | POA: Insufficient documentation

## 2020-10-11 DIAGNOSIS — I6782 Cerebral ischemia: Secondary | ICD-10-CM | POA: Diagnosis not present

## 2020-10-11 DIAGNOSIS — R220 Localized swelling, mass and lump, head: Secondary | ICD-10-CM | POA: Diagnosis not present

## 2020-10-11 MED ORDER — FLUORESCEIN SODIUM 1 MG OP STRP
1.0000 | ORAL_STRIP | Freq: Once | OPHTHALMIC | Status: AC
  Administered 2020-10-11: 1 via OPHTHALMIC
  Filled 2020-10-11: qty 1

## 2020-10-11 MED ORDER — TETRACAINE HCL 0.5 % OP SOLN
2.0000 [drp] | Freq: Once | OPHTHALMIC | Status: AC
  Administered 2020-10-11: 2 [drp] via OPHTHALMIC
  Filled 2020-10-11: qty 4

## 2020-10-11 NOTE — ED Notes (Signed)
Left eye pain and facial swelling x 4 days

## 2020-10-11 NOTE — ED Triage Notes (Signed)
Left eye pain and swelling to her left side of face since Monday
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
## 2020-10-11 NOTE — Telephone Encounter (Signed)
Nurse Assessment Nurse: Chestine Spore, RN, [place] Date/Time (Eastern Time): 10/11/2020 8:17:25 AM Confirm and document reason for call. If symptomatic, describe symptoms. ---caller states having pain behind one eye. left. not seeing as well from left eye. face is swollen. left Does the patient have any new or worsening symptoms? ---Yes Will a triage be completed? ---Yes Related visit to physician within the last 2 weeks? ---No Does the PT have any chronic conditions? (i.e. diabetes, asthma, this includes High risk factors for pregnancy, etc.) ---Yes List chronic conditions. ---hypertension. arthritis Is this a behavioral health or substance abuse call? ---No Guidelines Guideline Title Affirmed Question Affirmed Notes Nurse Date/Time Eilene Ghazi Time) Eye Pain SEVERE eye pain Chestine Spore, RN, [place] 10/11/2020 8:20:55 AM Disp. Time Eilene Ghazi Time) Disposition Final User 10/11/2020 8:23:40 AM Go to ED Now Yes Chestine Spore, RN, [place] Moment Disagree/Comply Comply Caller Understands Yes PreDisposition Call Doctor Care Advice Given Per Guideline PLEASE NOTE: All timestamps contained within this report are represented as [place] Standard Time. CONFIDENTIALTY NOTICE: This fax transmission is intended only for the addressee. It contains information that is legally privileged, confidential or otherwise protected from use or disclosure. If you are not the intended recipient, you are strictly prohibited from reviewing, disclosing, copying using or disseminating any of this information or taking any action in reliance on or regarding this information. If you have received this fax in error, please notify us immediately by telephone so that we can arrange for its return to [place]. Phone: [phone], Toll-Free: [phone], Fax: [phone] Page: 2 of 2 Call Id: [id number] Care Advice Given Per Guideline GO TO ED NOW: * You need to be seen in the Emergency Department. PAIN MEDICINES: * For pain relief, you can  take either acetaminophen, ibuprofen, or naproxen. ANOTHER ADULT SHOULD DRIVE: * It is better and safer if another adult drives instead of you. CARE ADVICE given per Eye Pain (Adult) guideline. Referrals [facility] ED  Pt currently in ED.

## 2020-10-11 NOTE — Discharge Instructions (Addendum)
Work-up here today to include ocular pressures no evidence of any corneal injury.  Not consistent with a uveitis.  CT of orbits and brain without any acute findings.  Recommend follow-up with your ophthalmologist.  Call for appointment.

## 2020-10-12 ENCOUNTER — Ambulatory Visit (HOSPITAL_BASED_OUTPATIENT_CLINIC_OR_DEPARTMENT_OTHER)
Admission: RE | Admit: 2020-10-12 | Discharge: 2020-10-12 | Disposition: A | Discharge status: Home / Self Care | Payer: Medicare Other | Source: Ambulatory Visit | Attending: Cardiology | Admitting: Cardiology

## 2020-10-12 DIAGNOSIS — M79644 Pain in right finger(s): Secondary | ICD-10-CM | POA: Diagnosis not present

## 2020-10-12 DIAGNOSIS — R011 Cardiac murmur, unspecified: Secondary | ICD-10-CM

## 2020-10-12 DIAGNOSIS — M25531 Pain in right wrist: Secondary | ICD-10-CM | POA: Diagnosis not present

## 2020-10-12 DIAGNOSIS — M79641 Pain in right hand: Secondary | ICD-10-CM | POA: Diagnosis not present

## 2020-10-12 DIAGNOSIS — R002 Palpitations: Secondary | ICD-10-CM | POA: Insufficient documentation

## 2020-10-12 DIAGNOSIS — M6281 Muscle weakness (generalized): Secondary | ICD-10-CM | POA: Diagnosis not present

## 2020-10-12 LAB — ECHOCARDIOGRAM COMPLETE
Area-P 1/2: 3.68 cm2
S' Lateral: 3.45 cm

## 2020-10-15 DIAGNOSIS — M25531 Pain in right wrist: Secondary | ICD-10-CM | POA: Diagnosis not present

## 2020-10-15 DIAGNOSIS — M6281 Muscle weakness (generalized): Secondary | ICD-10-CM | POA: Diagnosis not present

## 2020-10-15 DIAGNOSIS — M79641 Pain in right hand: Secondary | ICD-10-CM | POA: Diagnosis not present

## 2020-10-15 DIAGNOSIS — M79644 Pain in right finger(s): Secondary | ICD-10-CM | POA: Diagnosis not present

## 2020-10-17 DIAGNOSIS — H209 Unspecified iridocyclitis: Secondary | ICD-10-CM | POA: Diagnosis not present

## 2020-10-19 DIAGNOSIS — M79644 Pain in right finger(s): Secondary | ICD-10-CM | POA: Diagnosis not present

## 2020-10-19 DIAGNOSIS — M25531 Pain in right wrist: Secondary | ICD-10-CM | POA: Diagnosis not present

## 2020-10-19 DIAGNOSIS — M6281 Muscle weakness (generalized): Secondary | ICD-10-CM | POA: Diagnosis not present

## 2020-10-19 DIAGNOSIS — M79641 Pain in right hand: Secondary | ICD-10-CM | POA: Diagnosis not present

## 2020-10-26 DIAGNOSIS — E669 Obesity, unspecified: Secondary | ICD-10-CM | POA: Diagnosis not present

## 2020-10-26 DIAGNOSIS — R768 Other specified abnormal immunological findings in serum: Secondary | ICD-10-CM | POA: Diagnosis not present

## 2020-10-26 DIAGNOSIS — M79641 Pain in right hand: Secondary | ICD-10-CM | POA: Diagnosis not present

## 2020-10-26 DIAGNOSIS — M3500 Sicca syndrome, unspecified: Secondary | ICD-10-CM | POA: Diagnosis not present

## 2020-10-26 DIAGNOSIS — Z79899 Other long term (current) drug therapy: Secondary | ICD-10-CM | POA: Diagnosis not present

## 2020-10-26 DIAGNOSIS — M0579 Rheumatoid arthritis with rheumatoid factor of multiple sites without organ or systems involvement: Secondary | ICD-10-CM | POA: Diagnosis not present

## 2020-10-26 DIAGNOSIS — H209 Unspecified iridocyclitis: Secondary | ICD-10-CM | POA: Diagnosis not present

## 2020-10-26 DIAGNOSIS — Z6833 Body mass index (BMI) 33.0-33.9, adult: Secondary | ICD-10-CM | POA: Diagnosis not present

## 2020-10-26 DIAGNOSIS — M5030 Other cervical disc degeneration, unspecified cervical region: Secondary | ICD-10-CM | POA: Diagnosis not present

## 2020-10-26 DIAGNOSIS — R700 Elevated erythrocyte sedimentation rate: Secondary | ICD-10-CM | POA: Diagnosis not present

## 2020-11-02 DIAGNOSIS — M79641 Pain in right hand: Secondary | ICD-10-CM | POA: Diagnosis not present

## 2020-11-02 DIAGNOSIS — M6281 Muscle weakness (generalized): Secondary | ICD-10-CM | POA: Diagnosis not present

## 2020-11-02 DIAGNOSIS — M79644 Pain in right finger(s): Secondary | ICD-10-CM | POA: Diagnosis not present

## 2020-11-02 DIAGNOSIS — M25531 Pain in right wrist: Secondary | ICD-10-CM | POA: Diagnosis not present

## 2020-11-05 DIAGNOSIS — M6281 Muscle weakness (generalized): Secondary | ICD-10-CM | POA: Diagnosis not present

## 2020-11-05 DIAGNOSIS — M25531 Pain in right wrist: Secondary | ICD-10-CM | POA: Diagnosis not present

## 2020-11-05 DIAGNOSIS — M79644 Pain in right finger(s): Secondary | ICD-10-CM | POA: Diagnosis not present

## 2020-11-05 DIAGNOSIS — M79641 Pain in right hand: Secondary | ICD-10-CM | POA: Diagnosis not present

## 2020-11-08 DIAGNOSIS — M6281 Muscle weakness (generalized): Secondary | ICD-10-CM | POA: Diagnosis not present

## 2020-11-08 DIAGNOSIS — M79641 Pain in right hand: Secondary | ICD-10-CM | POA: Diagnosis not present

## 2020-11-08 DIAGNOSIS — M79644 Pain in right finger(s): Secondary | ICD-10-CM | POA: Diagnosis not present

## 2020-11-08 DIAGNOSIS — M25531 Pain in right wrist: Secondary | ICD-10-CM | POA: Diagnosis not present

## 2020-11-15 DIAGNOSIS — M6281 Muscle weakness (generalized): Secondary | ICD-10-CM | POA: Diagnosis not present

## 2020-11-15 DIAGNOSIS — M79641 Pain in right hand: Secondary | ICD-10-CM | POA: Diagnosis not present

## 2020-11-15 DIAGNOSIS — M25531 Pain in right wrist: Secondary | ICD-10-CM | POA: Diagnosis not present

## 2020-11-15 DIAGNOSIS — M79644 Pain in right finger(s): Secondary | ICD-10-CM | POA: Diagnosis not present

## 2020-11-19 DIAGNOSIS — M6281 Muscle weakness (generalized): Secondary | ICD-10-CM | POA: Diagnosis not present

## 2020-11-19 DIAGNOSIS — M25531 Pain in right wrist: Secondary | ICD-10-CM | POA: Diagnosis not present

## 2020-11-19 DIAGNOSIS — M79644 Pain in right finger(s): Secondary | ICD-10-CM | POA: Diagnosis not present

## 2020-11-19 DIAGNOSIS — M79641 Pain in right hand: Secondary | ICD-10-CM | POA: Diagnosis not present

## 2020-11-20 DIAGNOSIS — H209 Unspecified iridocyclitis: Secondary | ICD-10-CM | POA: Diagnosis not present

## 2020-11-23 DIAGNOSIS — M79641 Pain in right hand: Secondary | ICD-10-CM | POA: Diagnosis not present

## 2020-11-23 DIAGNOSIS — M6281 Muscle weakness (generalized): Secondary | ICD-10-CM | POA: Diagnosis not present

## 2020-11-23 DIAGNOSIS — M79644 Pain in right finger(s): Secondary | ICD-10-CM | POA: Diagnosis not present

## 2020-11-23 DIAGNOSIS — M25531 Pain in right wrist: Secondary | ICD-10-CM | POA: Diagnosis not present

## 2020-11-28 DIAGNOSIS — M25531 Pain in right wrist: Secondary | ICD-10-CM | POA: Diagnosis not present

## 2020-11-28 DIAGNOSIS — M79641 Pain in right hand: Secondary | ICD-10-CM | POA: Diagnosis not present

## 2020-11-28 DIAGNOSIS — M6281 Muscle weakness (generalized): Secondary | ICD-10-CM | POA: Diagnosis not present

## 2020-11-28 DIAGNOSIS — M79644 Pain in right finger(s): Secondary | ICD-10-CM | POA: Diagnosis not present

## 2020-12-08 DIAGNOSIS — Z03818 Encounter for observation for suspected exposure to other biological agents ruled out: Secondary | ICD-10-CM | POA: Diagnosis not present

## 2020-12-13 DIAGNOSIS — H209 Unspecified iridocyclitis: Secondary | ICD-10-CM | POA: Diagnosis not present

## 2020-12-25 ENCOUNTER — Ambulatory Visit: Payer: Medicare HMO | Admitting: Cardiology

## 2020-12-25 ENCOUNTER — Ambulatory Visit: Payer: Medicare Other | Admitting: Cardiology

## 2020-12-27 DIAGNOSIS — M3500 Sicca syndrome, unspecified: Secondary | ICD-10-CM | POA: Diagnosis not present

## 2020-12-27 DIAGNOSIS — R700 Elevated erythrocyte sedimentation rate: Secondary | ICD-10-CM | POA: Diagnosis not present

## 2020-12-27 DIAGNOSIS — H209 Unspecified iridocyclitis: Secondary | ICD-10-CM | POA: Diagnosis not present

## 2020-12-27 DIAGNOSIS — M5030 Other cervical disc degeneration, unspecified cervical region: Secondary | ICD-10-CM | POA: Diagnosis not present

## 2020-12-27 DIAGNOSIS — R768 Other specified abnormal immunological findings in serum: Secondary | ICD-10-CM | POA: Diagnosis not present

## 2020-12-27 DIAGNOSIS — M0579 Rheumatoid arthritis with rheumatoid factor of multiple sites without organ or systems involvement: Secondary | ICD-10-CM | POA: Diagnosis not present

## 2020-12-27 DIAGNOSIS — Z79899 Other long term (current) drug therapy: Secondary | ICD-10-CM | POA: Diagnosis not present

## 2020-12-27 DIAGNOSIS — E669 Obesity, unspecified: Secondary | ICD-10-CM | POA: Diagnosis not present

## 2020-12-27 DIAGNOSIS — Z6833 Body mass index (BMI) 33.0-33.9, adult: Secondary | ICD-10-CM | POA: Diagnosis not present

## 2020-12-27 DIAGNOSIS — M79641 Pain in right hand: Secondary | ICD-10-CM | POA: Diagnosis not present

## 2021-01-01 DIAGNOSIS — M1990 Unspecified osteoarthritis, unspecified site: Secondary | ICD-10-CM | POA: Insufficient documentation

## 2021-01-01 DIAGNOSIS — K219 Gastro-esophageal reflux disease without esophagitis: Secondary | ICD-10-CM | POA: Insufficient documentation

## 2021-01-02 ENCOUNTER — Other Ambulatory Visit: Payer: Self-pay

## 2021-01-02 ENCOUNTER — Other Ambulatory Visit: Payer: Self-pay | Admitting: Medical

## 2021-01-02 ENCOUNTER — Ambulatory Visit (INDEPENDENT_AMBULATORY_CARE_PROVIDER_SITE_OTHER): Payer: Medicare HMO | Admitting: Cardiology

## 2021-01-02 ENCOUNTER — Encounter: Payer: Self-pay | Admitting: Cardiology

## 2021-01-02 VITALS — BP 126/72 | HR 80 | Ht 68.0 in | Wt 223.0 lb

## 2021-01-02 DIAGNOSIS — E669 Obesity, unspecified: Secondary | ICD-10-CM

## 2021-01-02 DIAGNOSIS — I10 Essential (primary) hypertension: Secondary | ICD-10-CM

## 2021-01-02 DIAGNOSIS — E782 Mixed hyperlipidemia: Secondary | ICD-10-CM | POA: Diagnosis not present

## 2021-01-02 DIAGNOSIS — E66811 Obesity, class 1: Secondary | ICD-10-CM

## 2021-01-02 HISTORY — DX: Obesity, class 1: E66.811

## 2021-01-02 HISTORY — DX: Obesity, unspecified: E66.9

## 2021-01-02 NOTE — Patient Instructions (Signed)
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
## 2021-01-16 DIAGNOSIS — M0579 Rheumatoid arthritis with rheumatoid factor of multiple sites without organ or systems involvement: Secondary | ICD-10-CM | POA: Diagnosis not present

## 2021-01-17 DIAGNOSIS — Z79899 Other long term (current) drug therapy: Secondary | ICD-10-CM | POA: Diagnosis not present

## 2021-01-17 DIAGNOSIS — M0579 Rheumatoid arthritis with rheumatoid factor of multiple sites without organ or systems involvement: Secondary | ICD-10-CM | POA: Diagnosis not present

## 2021-01-21 ENCOUNTER — Ambulatory Visit (INDEPENDENT_AMBULATORY_CARE_PROVIDER_SITE_OTHER): Payer: Medicare HMO | Admitting: Medical

## 2021-01-21 ENCOUNTER — Other Ambulatory Visit: Payer: Self-pay

## 2021-01-21 VITALS — BP 112/72 | HR 70 | Temp 98.4°F | Resp 18 | Ht 68.0 in | Wt 225.0 lb

## 2021-01-21 DIAGNOSIS — I10 Essential (primary) hypertension: Secondary | ICD-10-CM | POA: Diagnosis not present

## 2021-01-21 DIAGNOSIS — K219 Gastro-esophageal reflux disease without esophagitis: Secondary | ICD-10-CM

## 2021-01-21 DIAGNOSIS — E782 Mixed hyperlipidemia: Secondary | ICD-10-CM | POA: Diagnosis not present

## 2021-01-21 DIAGNOSIS — Z86010 Personal history of colonic polyps: Secondary | ICD-10-CM

## 2021-01-21 DIAGNOSIS — M25519 Pain in unspecified shoulder: Secondary | ICD-10-CM | POA: Diagnosis not present

## 2021-01-21 DIAGNOSIS — R195 Other fecal abnormalities: Secondary | ICD-10-CM | POA: Diagnosis not present

## 2021-01-21 DIAGNOSIS — M1990 Unspecified osteoarthritis, unspecified site: Secondary | ICD-10-CM

## 2021-01-21 MED ORDER — NYSTATIN-TRIAMCINOLONE 100000-0.1 UNIT/GM-% EX CREA: 1.0000 "application " | TOPICAL_CREAM | Freq: Two times a day (BID) | CUTANEOUS | 1 refills | Status: DC

## 2021-01-21 NOTE — Patient Instructions (Addendum)
Hx of htn. Bp controlled with atacand 16 mg daily.  For shoulder pain put in referral. Can call to schedule or go over now to ask for appointment.  For gerd controlled continue protonix.  For hx of colon polyp refer to GI MD. For larger softer stools discussed today and won't prescribe med presently.  For RA continue on current meds prescribed by Rheumatologist.  High lipids. Follow Dr. Cherrie Gauze advise.   For fungal infection under breast that occur intermittently.  Follow up in 3 months or as needed
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
## 2021-01-22 LAB — CBC WITH DIFFERENTIAL/PLATELET
Basophils Absolute: 0 10*3/uL (ref 0.0–0.2)
Basos: 1 %
EOS (ABSOLUTE): 0.1 10*3/uL (ref 0.0–0.4)
Eos: 1 %
Hematocrit: 38.1 % (ref 34.0–46.6)
Hemoglobin: 12.2 g/dL (ref 11.1–15.9)
Immature Grans (Abs): 0 10*3/uL (ref 0.0–0.1)
Immature Granulocytes: 0 %
Lymphocytes Absolute: 3.2 10*3/uL — ABNORMAL HIGH (ref 0.7–3.1)
Lymphs: 36 %
MCH: 28 pg (ref 26.6–33.0)
MCHC: 32 g/dL (ref 31.5–35.7)
MCV: 87 fL (ref 79–97)
Monocytes Absolute: 0.4 10*3/uL (ref 0.1–0.9)
Monocytes: 5 %
Neutrophils Absolute: 5 10*3/uL (ref 1.4–7.0)
Neutrophils: 57 %
Platelets: 363 10*3/uL (ref 150–450)
RBC: 4.36 x10E6/uL (ref 3.77–5.28)
RDW: 14.5 % (ref 11.7–15.4)
WBC: 8.8 10*3/uL (ref 3.4–10.8)

## 2021-01-22 LAB — LIPID PANEL
Chol/HDL Ratio: 4.4 ratio (ref 0.0–4.4)
Cholesterol, Total: 292 mg/dL — ABNORMAL HIGH (ref 100–199)
HDL: 67 mg/dL (ref >39)
LDL Chol Calc (NIH): 213 mg/dL — ABNORMAL HIGH (ref 0–99)
Triglycerides: 77 mg/dL (ref 0–149)
VLDL Cholesterol Cal: 12 mg/dL (ref 5–40)

## 2021-01-22 LAB — HEPATIC FUNCTION PANEL
ALT: 13 IU/L (ref 0–32)
AST: 15 IU/L (ref 0–40)
Albumin: 4.2 g/dL (ref 3.8–4.8)
Alkaline Phosphatase: 80 IU/L (ref 44–121)
Bilirubin Total: 0.7 mg/dL (ref 0.0–1.2)
Bilirubin, Direct: 0.16 mg/dL (ref 0.00–0.40)
Total Protein: 7 g/dL (ref 6.0–8.5)

## 2021-01-22 LAB — BASIC METABOLIC PANEL
BUN/Creatinine Ratio: 19 (ref 12–28)
BUN: 17 mg/dL (ref 8–27)
CO2: 24 mmol/L (ref 20–29)
Calcium: 9.4 mg/dL (ref 8.7–10.3)
Chloride: 103 mmol/L (ref 96–106)
Creatinine, Ser: 0.89 mg/dL (ref 0.57–1.00)
GFR calc Af Amer: 78 mL/min/{1.73_m2} (ref >59)
GFR calc non Af Amer: 67 mL/min/{1.73_m2} (ref >59)
Glucose: 92 mg/dL (ref 65–99)
Potassium: 4.3 mmol/L (ref 3.5–5.2)
Sodium: 140 mmol/L (ref 134–144)

## 2021-01-22 LAB — TSH: TSH: 0.819 u[IU]/mL (ref 0.450–4.500)

## 2021-01-24 ENCOUNTER — Encounter: Payer: Self-pay | Admitting: Medical

## 2021-01-24 DIAGNOSIS — I10 Essential (primary) hypertension: Secondary | ICD-10-CM

## 2021-01-24 DIAGNOSIS — K219 Gastro-esophageal reflux disease without esophagitis: Secondary | ICD-10-CM

## 2021-01-24 MED ORDER — CANDESARTAN CILEXETIL 16 MG PO TABS: 16.0000 mg | ORAL_TABLET | Freq: Every day | ORAL | 1 refills | Status: DC

## 2021-01-24 MED ORDER — CELECOXIB 200 MG PO CAPS: 200.0000 mg | ORAL_CAPSULE | Freq: Two times a day (BID) | ORAL | 0 refills | Status: DC | PRN

## 2021-01-24 MED ORDER — PANTOPRAZOLE SODIUM 40 MG PO TBEC: 40.0000 mg | DELAYED_RELEASE_TABLET | Freq: Every day | ORAL | 3 refills | Status: DC

## 2021-01-28 MED ORDER — EZETIMIBE 10 MG PO TABS: 10.0000 mg | ORAL_TABLET | Freq: Every day | ORAL | 3 refills | Status: DC

## 2021-01-30 ENCOUNTER — Ambulatory Visit: Payer: Medicare HMO | Admitting: Family Medicine

## 2021-01-30 DIAGNOSIS — Z79899 Other long term (current) drug therapy: Secondary | ICD-10-CM | POA: Diagnosis not present

## 2021-01-30 DIAGNOSIS — M0579 Rheumatoid arthritis with rheumatoid factor of multiple sites without organ or systems involvement: Secondary | ICD-10-CM | POA: Diagnosis not present

## 2021-01-31 ENCOUNTER — Ambulatory Visit (INDEPENDENT_AMBULATORY_CARE_PROVIDER_SITE_OTHER): Payer: Medicare HMO | Admitting: Family Medicine

## 2021-01-31 ENCOUNTER — Other Ambulatory Visit: Payer: Self-pay

## 2021-01-31 ENCOUNTER — Encounter: Payer: Self-pay | Admitting: Medical

## 2021-01-31 VITALS — BP 136/90 | Ht 68.0 in | Wt 225.0 lb

## 2021-01-31 DIAGNOSIS — M7531 Calcific tendinitis of right shoulder: Secondary | ICD-10-CM

## 2021-01-31 HISTORY — DX: Calcific tendinitis of right shoulder: M75.31

## 2021-01-31 NOTE — Patient Instructions (Signed)
Good to see you  Please send me a message in MyChart with any questions or updates.  Please schedule for shockwave therapy in the next couple of weeks either on a Monday or Wednesday .   --Dr. Raeford Razor

## 2021-01-31 NOTE — Assessment & Plan Note (Signed)
Has calcific changes on MRI.  Has tried physical therapy and injections in the past.  Currently getting treatment for her ongoing rheumatoid arthritis. -Counseled home exercise therapy and supportive care. -Pursue shockwave therapy
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
## 2021-02-05 ENCOUNTER — Telehealth: Payer: Self-pay

## 2021-02-05 ENCOUNTER — Other Ambulatory Visit (HOSPITAL_COMMUNITY): Payer: Self-pay | Admitting: Medical

## 2021-02-05 ENCOUNTER — Other Ambulatory Visit: Payer: Self-pay

## 2021-02-05 ENCOUNTER — Ambulatory Visit (INDEPENDENT_AMBULATORY_CARE_PROVIDER_SITE_OTHER): Payer: Medicare HMO | Admitting: Medical

## 2021-02-05 VITALS — BP 129/54 | HR 72 | Temp 97.9°F | Ht 68.0 in | Wt 227.0 lb

## 2021-02-05 DIAGNOSIS — I10 Essential (primary) hypertension: Secondary | ICD-10-CM

## 2021-02-05 DIAGNOSIS — H9191 Unspecified hearing loss, right ear: Secondary | ICD-10-CM | POA: Diagnosis not present

## 2021-02-05 MED ORDER — EZETIMIBE 10 MG PO TABS: 10.0000 mg | ORAL_TABLET | Freq: Every day | ORAL | 3 refills | Status: DC

## 2021-02-05 MED FILL — EZETIMIBE 10 MG TABS: 10 | 30 days supply | Qty: 30 | Fill #0

## 2021-02-05 NOTE — Patient Instructions (Addendum)
Recent rt side hearing loss with no significant wax that would effect hearing. Went ahead and referred to audiologist for further evaluation and treatment/possible hearing aid.  Blood pressure controlled today. Continue atacand 16 mg daily.  For high cholesterol. Will give you print rx of zetia. Can try at different pharmacy.   Follow up in 3 months or as needed
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
## 2021-02-05 NOTE — Telephone Encounter (Signed)
Hey Dr Lyndel Safe, this pt is being referred to [place] from [facility] for a colonoscopy but it looks like the pt had one done in 2016, I will send the records to you for review, please advise on scheduling

## 2021-02-06 NOTE — Telephone Encounter (Signed)
I have reviewed some records She had colonoscopy by Dr. Shana Chute 09/27/15-colonic polyps (hyperplastic), neg random colon Bx. Per her notes advised to repeat colon October 2021. Has FH of polyps.  Plan: Reasonable to proceed with direct colonoscopy (routine), if she satisfies [facility] criteria RG

## 2021-02-11 ENCOUNTER — Encounter: Payer: Self-pay | Admitting: Gastroenterology

## 2021-02-25 NOTE — Telephone Encounter (Signed)
Patient wants to see ENT

## 2021-02-27 ENCOUNTER — Other Ambulatory Visit: Payer: Self-pay

## 2021-02-27 ENCOUNTER — Encounter: Payer: Self-pay | Admitting: Gastroenterology

## 2021-02-27 ENCOUNTER — Ambulatory Visit (INDEPENDENT_AMBULATORY_CARE_PROVIDER_SITE_OTHER): Payer: Medicare HMO | Admitting: Gastroenterology

## 2021-02-27 VITALS — BP 124/70 | HR 74 | Ht 68.0 in | Wt 227.2 lb

## 2021-02-27 DIAGNOSIS — Z903 Acquired absence of stomach [part of]: Secondary | ICD-10-CM

## 2021-02-27 DIAGNOSIS — Z86010 Personal history of colonic polyps: Secondary | ICD-10-CM

## 2021-02-27 DIAGNOSIS — K219 Gastro-esophageal reflux disease without esophagitis: Secondary | ICD-10-CM

## 2021-02-27 DIAGNOSIS — M0579 Rheumatoid arthritis with rheumatoid factor of multiple sites without organ or systems involvement: Secondary | ICD-10-CM | POA: Diagnosis not present

## 2021-02-27 MED ORDER — PANTOPRAZOLE SODIUM 40 MG PO TBEC: 40.0000 mg | DELAYED_RELEASE_TABLET | Freq: Every day | ORAL | 11 refills | Status: DC
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
## 2021-02-27 NOTE — Patient Instructions (Signed)
If you are age 68 or older, your body mass index should be between 23-30. Your Body mass index is 34.55 kg/m. If this is out of the aforementioned range listed, please consider follow up with your Primary Care Provider.  If you are age 75 or younger, your body mass index should be between 19-25. Your Body mass index is 34.55 kg/m. If this is out of the aformentioned range listed, please consider follow up with your Primary Care Provider.   You have been scheduled for an endoscopy and colonoscopy. Please follow the written instructions given to you at your visit today. Please pick up your prep supplies at the pharmacy within the next 1-3 days. If you use inhalers (even only as needed), please bring them with you on the day of your procedure.  Two days before your procedure: Mix 3 packs (or capfuls) of Miralax in 48 ounces of clear liquid and drink at 6pm.  We have sent the following medications to your pharmacy for you to pick up at your convenience: Protonix 40 mg   Thank you,  Dr. Jackquline Denmark

## 2021-02-28 DIAGNOSIS — M0579 Rheumatoid arthritis with rheumatoid factor of multiple sites without organ or systems involvement: Secondary | ICD-10-CM | POA: Diagnosis not present

## 2021-02-28 DIAGNOSIS — R768 Other specified abnormal immunological findings in serum: Secondary | ICD-10-CM | POA: Diagnosis not present

## 2021-02-28 DIAGNOSIS — M3500 Sicca syndrome, unspecified: Secondary | ICD-10-CM | POA: Diagnosis not present

## 2021-02-28 DIAGNOSIS — H209 Unspecified iridocyclitis: Secondary | ICD-10-CM | POA: Diagnosis not present

## 2021-02-28 DIAGNOSIS — Z79899 Other long term (current) drug therapy: Secondary | ICD-10-CM | POA: Diagnosis not present

## 2021-02-28 DIAGNOSIS — M5030 Other cervical disc degeneration, unspecified cervical region: Secondary | ICD-10-CM | POA: Diagnosis not present

## 2021-02-28 DIAGNOSIS — R700 Elevated erythrocyte sedimentation rate: Secondary | ICD-10-CM | POA: Diagnosis not present

## 2021-02-28 DIAGNOSIS — Z6834 Body mass index (BMI) 34.0-34.9, adult: Secondary | ICD-10-CM | POA: Diagnosis not present

## 2021-02-28 DIAGNOSIS — M79641 Pain in right hand: Secondary | ICD-10-CM | POA: Diagnosis not present

## 2021-02-28 DIAGNOSIS — H9191 Unspecified hearing loss, right ear: Secondary | ICD-10-CM | POA: Diagnosis not present

## 2021-03-06 ENCOUNTER — Encounter: Payer: Self-pay | Admitting: Medical

## 2021-03-06 DIAGNOSIS — H8101 Meniere's disease, right ear: Secondary | ICD-10-CM

## 2021-03-06 DIAGNOSIS — H8112 Benign paroxysmal vertigo, left ear: Secondary | ICD-10-CM

## 2021-03-06 DIAGNOSIS — H903 Sensorineural hearing loss, bilateral: Secondary | ICD-10-CM

## 2021-03-06 HISTORY — DX: Sensorineural hearing loss, bilateral: H90.3

## 2021-03-06 HISTORY — DX: Benign paroxysmal vertigo, left ear: H81.12

## 2021-03-06 HISTORY — DX: Meniere's disease, right ear: H81.01

## 2021-03-13 ENCOUNTER — Telehealth: Payer: Medicare HMO | Admitting: Emergency Medicine

## 2021-03-13 DIAGNOSIS — M545 Low back pain, unspecified: Secondary | ICD-10-CM | POA: Diagnosis not present

## 2021-03-13 MED ORDER — LIDOCAINE 5 % EX PTCH: 1.0000 | MEDICATED_PATCH | CUTANEOUS | 0 refills | Status: DC

## 2021-03-13 MED ORDER — METHOCARBAMOL 500 MG PO TABS: 500.0000 mg | ORAL_TABLET | Freq: Two times a day (BID) | ORAL | 0 refills | Status: DC | PRN

## 2021-03-13 MED ORDER — CYCLOBENZAPRINE HCL 10 MG PO TABS: 10.0000 mg | ORAL_TABLET | Freq: Three times a day (TID) | ORAL | 0 refills | Status: DC | PRN

## 2021-03-13 NOTE — Addendum Note (Signed)
Addended by: Montine Circle B on: 03/13/2021 04:35 PM   Modules accepted: Orders

## 2021-03-13 NOTE — Progress Notes (Signed)

## 2021-03-25 DIAGNOSIS — M1990 Unspecified osteoarthritis, unspecified site: Secondary | ICD-10-CM | POA: Diagnosis not present

## 2021-03-25 DIAGNOSIS — D84821 Immunodeficiency due to drugs: Secondary | ICD-10-CM | POA: Diagnosis not present

## 2021-03-25 DIAGNOSIS — M069 Rheumatoid arthritis, unspecified: Secondary | ICD-10-CM | POA: Diagnosis not present

## 2021-03-25 DIAGNOSIS — Z7722 Contact with and (suspected) exposure to environmental tobacco smoke (acute) (chronic): Secondary | ICD-10-CM | POA: Diagnosis not present

## 2021-03-25 DIAGNOSIS — I10 Essential (primary) hypertension: Secondary | ICD-10-CM | POA: Diagnosis not present

## 2021-03-25 DIAGNOSIS — Z89422 Acquired absence of other left toe(s): Secondary | ICD-10-CM | POA: Diagnosis not present

## 2021-03-25 DIAGNOSIS — Z7952 Long term (current) use of systemic steroids: Secondary | ICD-10-CM | POA: Diagnosis not present

## 2021-03-25 DIAGNOSIS — Z791 Long term (current) use of non-steroidal anti-inflammatories (NSAID): Secondary | ICD-10-CM | POA: Diagnosis not present

## 2021-03-25 DIAGNOSIS — K219 Gastro-esophageal reflux disease without esophagitis: Secondary | ICD-10-CM | POA: Diagnosis not present

## 2021-03-25 DIAGNOSIS — E669 Obesity, unspecified: Secondary | ICD-10-CM | POA: Diagnosis not present

## 2021-03-26 ENCOUNTER — Encounter (HOSPITAL_BASED_OUTPATIENT_CLINIC_OR_DEPARTMENT_OTHER): Payer: Self-pay | Admitting: *Deleted

## 2021-03-26 ENCOUNTER — Emergency Department (HOSPITAL_BASED_OUTPATIENT_CLINIC_OR_DEPARTMENT_OTHER): Payer: Medicare HMO

## 2021-03-26 ENCOUNTER — Telehealth: Payer: Self-pay

## 2021-03-26 ENCOUNTER — Emergency Department (HOSPITAL_BASED_OUTPATIENT_CLINIC_OR_DEPARTMENT_OTHER)
Admission: EM | Admit: 2021-03-26 | Discharge: 2021-03-26 | Disposition: A | Discharge status: Home / Self Care | Payer: Medicare HMO | Attending: Emergency Medicine | Admitting: Emergency Medicine

## 2021-03-26 ENCOUNTER — Other Ambulatory Visit: Payer: Self-pay

## 2021-03-26 DIAGNOSIS — M546 Pain in thoracic spine: Secondary | ICD-10-CM

## 2021-03-26 DIAGNOSIS — W19XXXA Unspecified fall, initial encounter: Secondary | ICD-10-CM

## 2021-03-26 DIAGNOSIS — R519 Headache, unspecified: Secondary | ICD-10-CM | POA: Diagnosis not present

## 2021-03-26 DIAGNOSIS — M542 Cervicalgia: Secondary | ICD-10-CM | POA: Diagnosis not present

## 2021-03-26 DIAGNOSIS — R0781 Pleurodynia: Secondary | ICD-10-CM | POA: Insufficient documentation

## 2021-03-26 DIAGNOSIS — H9201 Otalgia, right ear: Secondary | ICD-10-CM | POA: Diagnosis not present

## 2021-03-26 DIAGNOSIS — I10 Essential (primary) hypertension: Secondary | ICD-10-CM | POA: Diagnosis not present

## 2021-03-26 DIAGNOSIS — S0990XA Unspecified injury of head, initial encounter: Secondary | ICD-10-CM | POA: Diagnosis not present

## 2021-03-26 DIAGNOSIS — S199XXA Unspecified injury of neck, initial encounter: Secondary | ICD-10-CM | POA: Diagnosis not present

## 2021-03-26 DIAGNOSIS — W01198A Fall on same level from slipping, tripping and stumbling with subsequent striking against other object, initial encounter: Secondary | ICD-10-CM | POA: Insufficient documentation

## 2021-03-26 DIAGNOSIS — Z79899 Other long term (current) drug therapy: Secondary | ICD-10-CM | POA: Insufficient documentation

## 2021-03-26 DIAGNOSIS — M549 Dorsalgia, unspecified: Secondary | ICD-10-CM | POA: Insufficient documentation

## 2021-03-26 DIAGNOSIS — S2241XA Multiple fractures of ribs, right side, initial encounter for closed fracture: Secondary | ICD-10-CM | POA: Diagnosis not present

## 2021-03-26 MED ORDER — METHOCARBAMOL 500 MG PO TABS: 500.0000 mg | ORAL_TABLET | Freq: Two times a day (BID) | ORAL | 0 refills | Status: DC | PRN

## 2021-03-26 MED ORDER — OXYCODONE-ACETAMINOPHEN 5-325 MG PO TABS
1.0000 | ORAL_TABLET | Freq: Once | ORAL | Status: AC
  Administered 2021-03-26: 1 via ORAL
  Filled 2021-03-26: qty 1

## 2021-03-26 NOTE — ED Triage Notes (Signed)
Yesterday she got caught in her dogs leash and fell. She hit the back of her head on asphalt driveway. No LOC. Soreness. No hematoma. She does not take blood thinners.

## 2021-03-26 NOTE — Telephone Encounter (Signed)
Appointment Request From: Judy [name]  With Provider: Mackie Pai, PA-C [place] [facility] [place]  Preferred Date Range: 03/26/2021 - 03/29/2021  Preferred Times: Any Time  Reason for visit: Office Visit  Comments: I fell and hit my head pretty hard. I was never unconscious to my knowledge but I do have head and ear pain. I fell flat back and hit the back of my head and upper back from a small height (step of large [employer] truck). I will call the desk to see if I can get an appointment before Thursday which is the first available on this format.   Advised patient to go to ED per Kim's advice , patient stated " she doesn't want to wait in the ER long " , advised her to come to [place] , waiting period could be shorter and to follow up after she goes to the ER.

## 2021-03-26 NOTE — Discharge Instructions (Addendum)
Continue taking home medications as prescribed. Continue using Celebrex and Robaxin as needed for pain. Follow-up with your primary care doctor for recheck of your symptoms. Return to emergency room if develop severe worsening pain, new persistent numbness, or any new, worsening, or concerning symptoms.
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
## 2021-03-26 NOTE — Telephone Encounter (Signed)
Per epic patient did go to the ER

## 2021-03-26 NOTE — Telephone Encounter (Signed)
Agree with advise given to be seen in the ED. With description she gives needs to be seen today rather than waiting until Thursday. ED would probably order CT head.

## 2021-03-30 ENCOUNTER — Other Ambulatory Visit: Payer: Self-pay | Admitting: Medical

## 2021-03-30 DIAGNOSIS — I10 Essential (primary) hypertension: Secondary | ICD-10-CM

## 2021-04-08 ENCOUNTER — Other Ambulatory Visit: Payer: Self-pay

## 2021-04-08 ENCOUNTER — Other Ambulatory Visit: Payer: Self-pay | Admitting: Gastroenterology

## 2021-04-08 ENCOUNTER — Ambulatory Visit (AMBULATORY_SURGERY_CENTER): Payer: Medicare HMO | Admitting: Gastroenterology

## 2021-04-08 ENCOUNTER — Encounter: Payer: Self-pay | Admitting: Gastroenterology

## 2021-04-08 VITALS — BP 131/91 | HR 70 | Temp 97.0°F | Resp 14 | Ht 68.0 in | Wt 227.0 lb

## 2021-04-08 DIAGNOSIS — K635 Polyp of colon: Secondary | ICD-10-CM

## 2021-04-08 DIAGNOSIS — K317 Polyp of stomach and duodenum: Secondary | ICD-10-CM

## 2021-04-08 DIAGNOSIS — Z903 Acquired absence of stomach [part of]: Secondary | ICD-10-CM

## 2021-04-08 DIAGNOSIS — K219 Gastro-esophageal reflux disease without esophagitis: Secondary | ICD-10-CM | POA: Diagnosis not present

## 2021-04-08 DIAGNOSIS — Z86010 Personal history of colonic polyps: Secondary | ICD-10-CM

## 2021-04-08 DIAGNOSIS — D125 Benign neoplasm of sigmoid colon: Secondary | ICD-10-CM

## 2021-04-08 DIAGNOSIS — K229 Disease of esophagus, unspecified: Secondary | ICD-10-CM | POA: Diagnosis not present

## 2021-04-08 DIAGNOSIS — Z9884 Bariatric surgery status: Secondary | ICD-10-CM | POA: Diagnosis not present

## 2021-04-08 MED ORDER — SODIUM CHLORIDE 0.9 % IV SOLN: 500.0000 mL | Freq: Once | INTRAVENOUS | Status: DC

## 2021-04-08 NOTE — Patient Instructions (Signed)
HANDOUTS given for polyps and diverticulosis  No NSAIDS (Non-Steroidal anti-inflammatory drugs).  (These include, aspirin, aspirin-containing products, ibuprofen, advil, motrin, naproxen, aleve, goody powders, etc) Tylenol is ok to take as needed, see label for instructions.   YOU HAD AN ENDOSCOPIC PROCEDURE TODAY AT [facility]:   Refer to the procedure report that was given to you for any specific questions about what was found during the examination.  If the procedure report does not answer your questions, please call your gastroenterologist to clarify.  If you requested that your care partner not be given the details of your procedure findings, then the procedure report has been included in a sealed envelope for you to review at your convenience later.  YOU SHOULD EXPECT: Some feelings of bloating in the abdomen. Passage of more gas than usual.  Walking can help get rid of the air that was put into your GI tract during the procedure and reduce the bloating. If you had a lower endoscopy (such as a colonoscopy or flexible sigmoidoscopy) you may notice spotting of blood in your stool or on the toilet paper. If you underwent a bowel prep for your procedure, you may not have a normal bowel movement for a few days.  Please Note:  You might notice some irritation and congestion in your nose or some drainage.  This is from the oxygen used during your procedure.  There is no need for concern and it should clear up in a day or so.  SYMPTOMS TO REPORT IMMEDIATELY:   Following lower endoscopy (colonoscopy or flexible sigmoidoscopy):  Excessive amounts of blood in the stool  Significant tenderness or worsening of abdominal pains  Swelling of the abdomen that is new, acute  Fever of 100F or higher  Following upper endoscopy (EGD)  Vomiting of blood or coffee ground material  New chest pain or pain under the shoulder blades  Painful or persistently difficult swallowing  New shortness of  breath  Black, tarry-looking stools For urgent or emergent issues, a gastroenterologist can be reached at any hour by calling [phone]. Do not use MyChart messaging for urgent concerns.    DIET:  We do recommend a small meal at first, but then you may proceed to your regular diet.  Drink plenty of fluids but you should avoid alcoholic beverages for 24 hours.  ACTIVITY:  You should plan to take it easy for the rest of today and you should NOT DRIVE or use heavy machinery until tomorrow (because of the sedation medicines used during the test).    FOLLOW UP: Our staff will call the number listed on your records Wednesday 5/4, 715-8 AM  following your procedure to check on you and address any questions or concerns that you may have regarding the information given to you following your procedure. If we do not reach you, we will leave a message.  We will attempt to reach you two times.  During this call, we will ask if you have developed any symptoms of COVID 19. If you develop any symptoms (ie: fever, flu-like symptoms, shortness of breath, cough etc.) before then, please call [phone].  If you test positive for Covid 19 in the 2 weeks post procedure, please call and report this information to [place].    If any biopsies were taken you will be contacted by phone or by letter within the next 1-3 weeks.  Please call us at [phone] if you have not heard about the biopsies in 3 weeks.  RETURN TO GI CLINIC IN 6-8 WEEKS.   SIGNATURES/CONFIDENTIALITY: You and/or your care partner have signed paperwork which will be entered into your electronic medical record.  These signatures attest to the fact that that the information above on your After Visit Summary has been reviewed and is understood.  Full responsibility of the confidentiality of this discharge information lies with you and/or your care-partner.

## 2021-04-08 NOTE — Progress Notes (Signed)
Called to room to assist during endoscopic procedure.  Patient ID and intended procedure confirmed with present staff. Received instructions for my participation in the procedure from the performing physician.  

## 2021-04-08 NOTE — Op Note (Signed)
[place] Patient Name: Judy [name] Procedure Date: 04/08/2021 2:08 PM MRN: [id number] Endoscopist: Jackquline Denmark , MD Age: 70 Referring MD:  Date of Birth: 1951/07/31 Gender: Female Account #: [id number] Procedure:                Colonoscopy Indications:              High risk colon cancer surveillance: Personal                            history of colonic polyps Medicines:                Monitored Anesthesia Care Procedure:                Pre-Anesthesia Assessment:                           - Prior to the procedure, a History and Physical                            was performed, and patient medications and                            allergies were reviewed. The patient's tolerance of                            previous anesthesia was also reviewed. The risks                            and benefits of the procedure and the sedation                            options and risks were discussed with the patient.                            All questions were answered, and informed consent                            was obtained. Prior Anticoagulants: The patient has                            taken no previous anticoagulant or antiplatelet                            agents. ASA Grade Assessment: II - A patient with                            mild systemic disease. After reviewing the risks                            and benefits, the patient was deemed in                            satisfactory condition to undergo the procedure.  After obtaining informed consent, the colonoscope                            was passed under direct vision. Throughout the                            procedure, the patient's blood pressure, pulse, and                            oxygen saturations were monitored continuously. The                            Olympus [id number]-[id number]) Colonoscope was                            introduced through the anus and advanced to the  the                            cecum, identified by appendiceal orifice and                            ileocecal valve. The colonoscopy was performed                            without difficulty. The patient tolerated the                            procedure well. The quality of the bowel                            preparation was good. The ileocecal valve,                            appendiceal orifice, and rectum were photographed. Scope In: 2:31:27 PM Scope Out: 2:45:07 PM Scope Withdrawal Time: 0 hours 9 minutes 24 seconds  Total Procedure Duration: 0 hours 13 minutes 40 seconds  Findings:                 A 4 mm polyp was found in the sigmoid colon. The                            polyp was sessile. The polyp was removed with a                            cold biopsy forceps. Resection and retrieval were                            complete.                           A few small-mouthed diverticula were found in the                            sigmoid colon.  Non-bleeding internal hemorrhoids were found during                            retroflexion. The hemorrhoids were small and Grade                            I (internal hemorrhoids that do not prolapse).                           The exam was otherwise without abnormality on                            direct and retroflexion views. Complications:            No immediate complications. Estimated Blood Loss:     Estimated blood loss: none. Impression:               - One 4 mm polyp in the sigmoid colon, removed with                            a cold biopsy forceps. Resected and retrieved.                           - Minimal sigmoid Diverticulosis                           - Non-bleeding internal hemorrhoids.                           - The examination was otherwise normal on direct                            and retroflexion views. Recommendation:           - Patient has a contact number available for                             emergencies. The signs and symptoms of potential                            delayed complications were discussed with the                            patient. Return to normal activities tomorrow.                            Written discharge instructions were provided to the                            patient.                           - Resume previous diet.                           - Continue present medications.                           -  Await pathology results.                           - Repeat colonoscopy for surveillance based on                            pathology results.                           - Return to GI clinic PRN. Jackquline Denmark, MD 04/08/2021 2:50:12 PM This report has been signed electronically.

## 2021-04-08 NOTE — Progress Notes (Signed)
pt tolerated well. VSS. awake and to recovery. Report given to RN. Pt spit out oral scope guard. No trauma.

## 2021-04-08 NOTE — Op Note (Signed)
[place] Patient Name: Judy [name] Procedure Date: 04/08/2021 2:10 PM MRN: [id number] Endoscopist: Jackquline Denmark , MD Age: 70 Referring MD:  Date of Birth: 1951/11/13 Gender: Female Account #: [id number] Procedure:                Upper GI endoscopy Indications:              Functional Dyspepsia, GERD Medicines:                Monitored Anesthesia Care Procedure:                Pre-Anesthesia Assessment:                           - Prior to the procedure, a History and Physical                            was performed, and patient medications and                            allergies were reviewed. The patient's tolerance of                            previous anesthesia was also reviewed. The risks                            and benefits of the procedure and the sedation                            options and risks were discussed with the patient.                            All questions were answered, and informed consent                            was obtained. Prior Anticoagulants: The patient has                            taken no previous anticoagulant or antiplatelet                            agents. ASA Grade Assessment: II - A patient with                            mild systemic disease. After reviewing the risks                            and benefits, the patient was deemed in                            satisfactory condition to undergo the procedure.                           After obtaining informed consent, the endoscope was  passed under direct vision. Throughout the                            procedure, the patient's blood pressure, pulse, and                            oxygen saturations were monitored continuously. The                            Endoscope was introduced through the mouth, and                            advanced to the second part of duodenum. The upper                            GI endoscopy was accomplished  without difficulty.                            The patient tolerated the procedure well. Scope In: Scope Out: Findings:                 The examined esophagus was normal with well-defined                            Z-line at 40 cm, examined by NBI.                           Evidence of a sleeve gastrectomy was found in the                            stomach. This was characterized by erythema. The                            stomach was enlarged with hourglass appearance.                            Multiple biopsies were taken with a cold forceps                            for histology.                           A few 4 to 6 mm sessile polyps with no stigmata of                            recent bleeding were found in the gastric body. 3                            polyps were removed with a cold biopsy forceps.                            Resection and retrieval were complete.  The examined duodenum was normal. Biopsies for                            histology were taken with a cold forceps for                            evaluation of celiac disease. Complications:            No immediate complications. Estimated Blood Loss:     Estimated blood loss: none. Impression:               - A sleeve gastrectomy was found, characterized by                            erythema. Biopsied.                           - A few gastric polyps. Resected and retrieved x 3. Recommendation:           - Patient has a contact number available for                            emergencies. The signs and symptoms of potential                            delayed complications were discussed with the                            patient. Return to normal activities tomorrow.                            Written discharge instructions were provided to the                            patient.                           - Resume previous diet.                           - Continue present medications  including Protonix                            40 mg p.o daily.                           - Avoid ibuprofen, naproxen, or other non-steroidal                            anti-inflammatory drugs.                           - Await pathology results.                           - FU in GI clinic in 6 to 8 weeks. If still with  upper GI problems, proceed with upper GI series.                           - The findings and recommendations were discussed                            with the patient's family. Jackquline Denmark, MD 04/08/2021 2:55:27 PM This report has been signed electronically.

## 2021-04-10 ENCOUNTER — Telehealth: Payer: Self-pay | Admitting: *Deleted

## 2021-04-10 DIAGNOSIS — H209 Unspecified iridocyclitis: Secondary | ICD-10-CM | POA: Diagnosis not present

## 2021-04-10 DIAGNOSIS — M0579 Rheumatoid arthritis with rheumatoid factor of multiple sites without organ or systems involvement: Secondary | ICD-10-CM | POA: Diagnosis not present

## 2021-04-10 DIAGNOSIS — R700 Elevated erythrocyte sedimentation rate: Secondary | ICD-10-CM | POA: Diagnosis not present

## 2021-04-10 DIAGNOSIS — H9191 Unspecified hearing loss, right ear: Secondary | ICD-10-CM | POA: Diagnosis not present

## 2021-04-10 DIAGNOSIS — R768 Other specified abnormal immunological findings in serum: Secondary | ICD-10-CM | POA: Diagnosis not present

## 2021-04-10 DIAGNOSIS — Z6834 Body mass index (BMI) 34.0-34.9, adult: Secondary | ICD-10-CM | POA: Diagnosis not present

## 2021-04-10 DIAGNOSIS — M5030 Other cervical disc degeneration, unspecified cervical region: Secondary | ICD-10-CM | POA: Diagnosis not present

## 2021-04-10 DIAGNOSIS — Z79899 Other long term (current) drug therapy: Secondary | ICD-10-CM | POA: Diagnosis not present

## 2021-04-10 DIAGNOSIS — M79641 Pain in right hand: Secondary | ICD-10-CM | POA: Diagnosis not present

## 2021-04-10 DIAGNOSIS — M3500 Sicca syndrome, unspecified: Secondary | ICD-10-CM | POA: Diagnosis not present

## 2021-04-10 MED ORDER — LIDOCAINE VISCOUS HCL 2 % MT SOLN: 10.0000 mL | Freq: Four times a day (QID) | OROMUCOSAL | 0 refills | Status: DC | PRN

## 2021-04-10 NOTE — Telephone Encounter (Signed)
  Follow up Call-  Call back number 04/08/2021  Post procedure Call Back phone  # [phone]  Permission to leave phone message Yes  Some recent data might be hidden     Patient questions:  Do you have a fever, pain , or abdominal swelling? Had EGD. States pain with eating. Pain Score  4  Have you tolerated food without any problems? No  Have you been able to return to your normal activities? Yes  Do you have any questions about your discharge instructions: Diet   Yes.   Medications  No. Follow up visit  No.  Do you have questions or concerns about your Care? Yes.    Actions: * If pain score is 4 or above:  States throat pain #4 See note. Physician/ provider Notified : Jackquline Denmark MD  Have you developed a fever since your procedure? no  2.   Have you had an respiratory symptoms (SOB or cough) since your procedure? no  3.   Have you tested positive for COVID 19 since your procedure no  4.   Have you had any family members/close contacts diagnosed with the COVID 19 since your procedure? no   If yes to any of these questions please route to Joylene John, RN and Joella Prince, RN

## 2021-04-10 NOTE — Telephone Encounter (Signed)
DR. Lyndel Safe,  Upon call backs today, pt c/o sore throat, rating as "4".  States she is able to eat "a little bit."  Please advise.  Thanks, [employer]

## 2021-04-10 NOTE — Telephone Encounter (Signed)
Sore throat was expected. Plan: -Increase Protonix 40 mg p.o. twice daily x 1 week and then cut it down to once a day -GI cocktail (equal amounts of Maalox, viscous lidocaine, liquid Bentyl) 10 cc po q6hrs prn #120 ml. -She will get better in the next 48 hours RG

## 2021-04-10 NOTE — Telephone Encounter (Signed)
Spoke with pt and gave her Dr. Steve Rattler recommendations.  GI cocktail [facility].  Understanding voiced

## 2021-04-18 ENCOUNTER — Other Ambulatory Visit (HOSPITAL_BASED_OUTPATIENT_CLINIC_OR_DEPARTMENT_OTHER): Payer: Self-pay

## 2021-04-18 ENCOUNTER — Encounter: Payer: Self-pay | Admitting: Medical

## 2021-04-18 MED ORDER — METHOTREXATE SODIUM CHEMO INJECTION 50 MG/2ML
25.0000 mg | INTRAMUSCULAR | 1 refills | Status: DC
  Filled 2021-04-18: qty 2, 14d supply, fill #0

## 2021-04-24 DIAGNOSIS — M0579 Rheumatoid arthritis with rheumatoid factor of multiple sites without organ or systems involvement: Secondary | ICD-10-CM | POA: Diagnosis not present

## 2021-04-24 DIAGNOSIS — Z79899 Other long term (current) drug therapy: Secondary | ICD-10-CM | POA: Diagnosis not present

## 2021-05-01 ENCOUNTER — Encounter: Payer: Self-pay | Admitting: Gastroenterology

## 2021-06-19 DIAGNOSIS — R5383 Other fatigue: Secondary | ICD-10-CM | POA: Diagnosis not present

## 2021-06-19 DIAGNOSIS — M0579 Rheumatoid arthritis with rheumatoid factor of multiple sites without organ or systems involvement: Secondary | ICD-10-CM | POA: Diagnosis not present

## 2021-06-19 DIAGNOSIS — Z79899 Other long term (current) drug therapy: Secondary | ICD-10-CM | POA: Diagnosis not present

## 2021-06-22 DIAGNOSIS — Z20822 Contact with and (suspected) exposure to covid-19: Secondary | ICD-10-CM | POA: Diagnosis not present

## 2021-06-23 ENCOUNTER — Other Ambulatory Visit: Payer: Self-pay | Admitting: Physician Assistant

## 2021-06-23 ENCOUNTER — Telehealth: Payer: Medicare HMO | Admitting: Orthopedic Surgery

## 2021-06-23 ENCOUNTER — Telehealth: Payer: Self-pay

## 2021-06-23 ENCOUNTER — Encounter (INDEPENDENT_AMBULATORY_CARE_PROVIDER_SITE_OTHER): Payer: Self-pay

## 2021-06-23 ENCOUNTER — Telehealth: Payer: Medicare HMO | Admitting: Physician Assistant

## 2021-06-23 ENCOUNTER — Encounter: Payer: Self-pay | Admitting: Physician Assistant

## 2021-06-23 DIAGNOSIS — Z20822 Contact with and (suspected) exposure to covid-19: Secondary | ICD-10-CM | POA: Diagnosis not present

## 2021-06-23 DIAGNOSIS — U071 COVID-19: Secondary | ICD-10-CM

## 2021-06-23 MED ORDER — ALBUTEROL SULFATE HFA 108 (90 BASE) MCG/ACT IN AERS
2.0000 | INHALATION_SPRAY | Freq: Four times a day (QID) | RESPIRATORY_TRACT | 0 refills | Status: DC | PRN
Start: 2021-06-23 — End: 2021-06-23

## 2021-06-23 MED ORDER — MOLNUPIRAVIR EUA 200MG CAPSULE: 4.0000 | ORAL_CAPSULE | Freq: Two times a day (BID) | ORAL | 0 refills | Status: AC

## 2021-06-23 MED ORDER — ALBUTEROL SULFATE HFA 108 (90 BASE) MCG/ACT IN AERS: 2.0000 | INHALATION_SPRAY | Freq: Four times a day (QID) | RESPIRATORY_TRACT | 0 refills | Status: DC | PRN

## 2021-06-23 MED ORDER — BENZONATATE 100 MG PO CAPS: 100.0000 mg | ORAL_CAPSULE | Freq: Three times a day (TID) | ORAL | 0 refills | Status: DC | PRN

## 2021-06-23 MED ORDER — MOLNUPIRAVIR EUA 200MG CAPSULE: 4.0000 | ORAL_CAPSULE | Freq: Two times a day (BID) | ORAL | 0 refills | Status: DC

## 2021-06-23 NOTE — Progress Notes (Signed)
Virtual Visit Consent   Judy [name], you are scheduled for a virtual visit with a [facility] provider today.     Just as with appointments in the office, your consent must be obtained to participate.  Your consent will be active for this visit and any virtual visit you may have with one of our providers in the next 365 days.     If you have a MyChart account, a copy of this consent can be sent to you electronically.  All virtual visits are billed to your insurance company just like a traditional visit in the office.    As this is a virtual visit, video technology does not allow for your provider to perform a traditional examination.  This may limit your provider's ability to fully assess your condition.  If your provider identifies any concerns that need to be evaluated in person or the need to arrange testing (such as labs, EKG, etc.), we will make arrangements to do so.     Although advances in technology are sophisticated, we cannot ensure that it will always work on either your end or our end.  If the connection with a video visit is poor, the visit may have to be switched to a telephone visit.  With either a video or telephone visit, we are not always able to ensure that we have a secure connection.     I need to obtain your verbal consent now.   Are you willing to proceed with your visit today? Yes   Judy [name] has provided verbal consent on 06/23/2021 for a virtual visit (video or telephone).   Lisette Abu, PA-C   Date: 06/23/2021 9:19 AM   Virtual Visit via Video Note   I, Lisette Abu, connected with  Judy [name]  ([id number], May 13, 1951) on 06/23/21 at  9:15 AM EDT by a video-enabled telemedicine application and verified that I am speaking with the correct person using two identifiers.  Location: Patient: Virtual Visit Location Patient: Home Provider: Virtual Visit Location Provider: Home Office   I discussed the limitations of evaluation and management by  telemedicine and the availability of in person appointments. The patient expressed understanding and agreed to proceed.    History of Present Illness: Judy [name] is a 70 y.o. who identifies as a female who was assigned female at birth, and is being seen today for possible Covid infection. She was at a conference recently where several people were diagnosed with Covid. She began having symptoms yesterday with burning on her back and throat congestion. She took two at home tests Friday night, one of which was very faintly positive. [facility] that is not back yet. Her cough seems to be getting worse. The burning on her back feels like shingles but is bilateral and there is no associated rash.   HPI: HPI  Problems:  Patient Active Problem List   Diagnosis Date Noted   Calcific tendinitis of right shoulder 01/31/2021   Obesity (BMI 30.0-34.9) 01/02/2021   Arthritis    GERD (gastroesophageal reflux disease)    Essential hypertension 09/18/2020   Cardiac murmur 09/18/2020   Mixed dyslipidemia 09/18/2020   Palpitations 09/18/2020   Anemia    Back pain    Dysplasia of cervix    History of blood transfusion    Hypertension    Obesity    Shortness of breath    Sleep apnea    Pain in joint of  left shoulder 05/02/2020   Rheumatoid arthritis ([place]) 04/09/2020   Subacromial impingement, right 02/22/2020   Right wrist pain 02/22/2020   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   Rib cage dysfunction 03/30/2019   Rib pain on right side 03/30/2019   Urinary frequency 12/18/2017   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   Torn rotator cuff 07/2016   History of shingles 02/14/2016   Right ear pain 02/14/2016   Cough 08/26/2015   Posttraumatic stress disorder 05/16/2015   Syncope 12/12/2014   Depression 08/17/2014   Left rotator cuff tear 08/17/2014   Diarrhea 09/22/2013   Intervertebral disc protrusion 08/22/2013    Pulmonary hypertension ([place]) 08/03/2013   Leg swelling 08/03/2013   Lumbar spondylosis 07/03/2013   History of hematuria 06/25/2013   History of gastric polyp 06/25/2013   Left knee pain 05/19/2013   Insomnia 05/18/2013   Abnormal CT of spine 03/28/2013   Personal history of renal calculi 02/23/2013   Axillary mass 02/07/2013   Family history of breast cancer in sister 01/18/2013   OSA (obstructive sleep apnea) 01/18/2013   Personal history of colonic polyps 01/18/2013   Gastric polyp 01/18/2013   History of cervical dysplasia 06/15/2012   Osteoarthritis of knee 06/15/2012   Seasonal depression ([place]) 06/15/2012   Menopause 06/15/2012   Urinary incontinence 06/15/2012   Ventral hernia 06/15/2012   S/P hysterectomy 06/14/2012   Benign essential hypertension 06/14/2012   Other and unspecified hyperlipidemia 06/14/2012   Gastroesophageal reflux disease 06/14/2012   Hyperlipidemia 06/14/2012   Pulmonary embolism ([place]) 08/1975    Allergies:  Allergies  Allergen Reactions   Contrast Media [Iodinated Diagnostic Agents] Shortness Of Breath    Swelling mouth   Ioxaglate Shortness Of Breath    Swelling mouth   Ivp Dye [Iodinated Diagnostic Agents] Swelling   Metrizamide Shortness Of Breath and Swelling    Swelling mouth   Atorvastatin Other (See Comments)    Muscle aches requiring increased use of pain medication Muscle aches requiring increased use of pain medication   Medications:  Current Outpatient Medications:    candesartan (ATACAND) 16 MG tablet, TAKE 1 TABLET(16 MG) BY MOUTH DAILY, Disp: 90 tablet, Rfl: 1   celecoxib (CELEBREX) 200 MG capsule, Take 1 capsule (200 mg total) by mouth 2 (two) times daily as needed for moderate pain., Disp: 180 capsule, Rfl: 0   Cholecalciferol (VITAMIN D) 1000 UNITS capsule, Take 1,000 Units by mouth daily., Disp: , Rfl:    cyclobenzaprine (FLEXERIL) 10 MG tablet, Take 1 tablet (10 mg total) by mouth 3 (three) times daily as needed for  muscle spasms., Disp: 10 tablet, Rfl: 0   ezetimibe (ZETIA) 10 MG tablet, Take 1 tablet (10 mg total) by mouth daily. (Patient not taking: Reported on 04/08/2021), Disp: 30 tablet, Rfl: 3   ezetimibe (ZETIA) 10 MG tablet, TAKE 1 TABLET BY MOUTH ONCE DAILY (Patient not taking: Reported on 04/08/2021), Disp: 30 tablet, Rfl: 3   folic acid (FOLVITE) 1 MG tablet, Take 3 mg by mouth daily., Disp: , Rfl:    GI Cocktail (alum & mag hydroxide, lidocaine, dicyclomine) oral mixture, Take 10 mLs by mouth every 6 (six) hours as needed., Disp: 120 mL, Rfl: 0   inFLIXimab-abda (RENFLEXIS) 100 MG SOLR, Inject 100 mg into the vein every 8 (eight) weeks. Remicaid infusion every 8 weeks for RA, Disp: , Rfl:    lidocaine (LIDODERM) 5 %, Place 1 patch onto the skin daily. Remove & Discard patch within  12 hours or as directed by MD, Disp: 10 patch, Rfl: 0   methocarbamol (ROBAXIN) 500 MG tablet, Take 1 tablet (500 mg total) by mouth 2 (two) times daily as needed for muscle spasms., Disp: 14 tablet, Rfl: 0   methotrexate 50 MG/2ML injection, Inject 1 mL (25 mg total) into the muscle once a week., Disp: 2 mL, Rfl: 1   methylPREDNISolone (MEDROL) 4 MG tablet, Take 4 mg by mouth daily., Disp: , Rfl:    nystatin-triamcinolone (MYCOLOG II) cream, Apply 1 application topically 2 (two) times daily., Disp: 30 g, Rfl: 1   pantoprazole (PROTONIX) 40 MG tablet, Take 1 tablet (40 mg total) by mouth daily., Disp: 30 tablet, Rfl: 11   prednisoLONE acetate (PRED FORTE) 1 % ophthalmic suspension, Place 1 drop into the left eye daily., Disp: , Rfl:   Observations/Objective: Patient is well-developed, well-nourished in no acute distress.  Resting comfortably  at home.  Head is normocephalic, atraumatic.  No labored breathing.  Speech is clear and coherent with logical content.  Patient is alert and oriented at baseline.    Assessment and Plan: 1. Close exposure to COVID-19 virus -- Prescribed Tessalon for the cough. Advised to call  back [facility] came [facility] to start an antiviral. Went over isolation recommendations which she is already following.   Follow Up Instructions: I discussed the assessment and treatment plan with the patient. The patient was provided an opportunity to ask questions and all were answered. The patient agreed with the plan and demonstrated an understanding of the instructions.  A copy of instructions were sent to the patient via MyChart.  The patient was advised to call back or seek an in-person evaluation if the symptoms worsen or if the condition fails to improve as anticipated.  Time:  I spent 15 minutes with the patient via telehealth technology discussing the above problems/concerns.    Lisette Abu, PA-C

## 2021-06-23 NOTE — Telephone Encounter (Deleted)
No cough this am, no cough. Now with cough. Feels like a bad burn on her back.  Feels like chest congested. Dry cough.  Fluid intake 80 oz of water.  No SOB at rest or with activity. No coughing episodes. Pt is hoarse.  Pt stated her chest feels tight.  Advised pt to call 911 if coughing worsens to the point where cough medicine is not helping and if having any chest pain or pressure or SOB at rest. Advised to get humidifier and continue to drink fluids.  Advised pt to call doctor about her chest and back "burning." Advised pt to drink warm fluids to relax her airway and hard candy or cough drops.

## 2021-06-23 NOTE — Telephone Encounter (Signed)
Pt just developed coughing this afternoon.Pt stated that feels like a bad burn on her back.  Feels like chest is congested. Dry cough. No wheezing.  Fluid intake 80 oz of water.  No SOB at rest or with activity. No coughing episodes. Pt is hoarse.  Pt stated her chest feels tight.  Advised pt to call 911 if coughing worsens to the point where cough medicine is not helping and if having any chest pain or pressure or SOB at rest. Advised to get humidifier and continue to drink fluids.  Advised pt to call doctor about her chest and back "burning." Advised pt to drink warm fluids to relax her airway and hard candy or cough drops. Pt verbalized understanding.

## 2021-06-23 NOTE — Progress Notes (Signed)
Patient following up from video visit this morning after getting tested for COVID as directed by the treating provider. Was told to submit a follow-up video visit to discuss initiation of antiviral medications.   Symptoms starting Friday night into Saturday morning.  Positive result as of this afternoon, 06/23/2021 from PCP test obtained 06/22/2021.  Discussed antiviral treatment giving she has 5 listed risk factor for complications and considered high risk. She would only be candidate for molnupiravir. Rx sent in after discussion of pros/cons to antiviral medication and potential ADRs. She is to call her Rheumatologist to let them know she has COVID, so they can determine if they want to make any temporary adjustments to her immune-mediating medications.   Supportive measures, OTC medications and Vitamin Regimen reviewed with patient.  Patient has been enrolled in a [facility] monitoring program.  Strict ER precautions and quarantine guidelines reviewed. Instructions also sent to her MyChart.   Patient voiced understanding and agreement.  All questions were answered.   No charge for visit as she was evaluated earlier today via virtual urgent care visit.  Leeanne Rio, PA-C

## 2021-06-24 NOTE — Telephone Encounter (Signed)
Called patient this AM stated she still has dry cough , no SOB and denied appointment because she doesn't feel like the cough is bad but did say she will make an appointment if cough gets worse.

## 2021-06-25 ENCOUNTER — Telehealth: Payer: Self-pay

## 2021-06-25 ENCOUNTER — Encounter (INDEPENDENT_AMBULATORY_CARE_PROVIDER_SITE_OTHER): Payer: Self-pay

## 2021-06-25 NOTE — Telephone Encounter (Signed)
Contacted patient regarding covid questionnaire. Patient states that she tried to go out for a walk this morning and started having sob and weakness. Patient is back home resting and states that she is not having any sob at this time and will continue to rest and monitor symptoms. Patient advise per protocols for sob and weakness as follows:    Shortness of breath is the same: continue to monitor at home   If symptoms become severe, i.e. shortness of breath at rest, gasping for air, wheezing, CALL 911 AND SEEK TREATMENT IN THE ED   IF PATIENT HAS WORSENING WEAKNESS WITH INABILITY TO STAND OR IF PATIENT HAS TO HOLD ON TO SOMETHING TO GET BALANCE, ADVISE PATIENT TO CALL 911 AND SEEK TREATMENT IN ED

## 2021-06-29 ENCOUNTER — Encounter (INDEPENDENT_AMBULATORY_CARE_PROVIDER_SITE_OTHER): Payer: Self-pay

## 2021-06-29 ENCOUNTER — Telehealth: Payer: Self-pay

## 2021-06-29 NOTE — Telephone Encounter (Signed)
Pt c/o increased cough last night, and "sweaty." Pt is using tessalon Perles, coricidin HBP, and humidifier. Pt stated she has SOB with activity, occasional cough today with clear secretions. Lots of clearing her throat. Pt finished antivirals last night. Advised pt for cough: If cough remains the same or better: continue to treat with over the counter medications.  Hard candy or cough drops and drinking warm fluids. Adults can also use honey 2 tsp (10 ML) at bedtime.  If cough is becoming worse even with the use of over the counter medications and patient is not able to sleep at night, cough becomes productive with sputum that maybe yellow or green in color, contact PCP. (Advised MyChart virtual visit this weekend)  Weakness: Pt stated she is has less energy than yesterday. Pt stated she is able to walk independently.   If patient has worsening weakness with inability to stand or if patient has to hold on to something to get balance, advise patient to call 911 and seek treatment in ED  Pt verbalized understanding.

## 2021-07-03 ENCOUNTER — Encounter: Payer: Self-pay | Admitting: Medical

## 2021-07-03 ENCOUNTER — Telehealth: Payer: Self-pay | Admitting: *Deleted

## 2021-07-03 ENCOUNTER — Encounter (INDEPENDENT_AMBULATORY_CARE_PROVIDER_SITE_OTHER): Payer: Self-pay

## 2021-07-03 ENCOUNTER — Other Ambulatory Visit: Payer: Self-pay | Admitting: Medical

## 2021-07-03 NOTE — Telephone Encounter (Signed)
Patient having lingering cough, kept her up last night. Using Tessalon Perles-helps at times. Sleeping upright on pillows. Using lozenges and warm tea with honey for throat irritation. Discussed with patient it may linger for awhile longer but if she has increased SOB or wheezing to seek treatment immediately at the ED. Patient voiced understanding.

## 2021-07-05 ENCOUNTER — Encounter (INDEPENDENT_AMBULATORY_CARE_PROVIDER_SITE_OTHER): Payer: Self-pay

## 2021-07-05 ENCOUNTER — Other Ambulatory Visit (HOSPITAL_BASED_OUTPATIENT_CLINIC_OR_DEPARTMENT_OTHER): Payer: Self-pay

## 2021-07-05 ENCOUNTER — Encounter: Payer: Self-pay | Admitting: Medical

## 2021-07-05 ENCOUNTER — Telehealth: Payer: Self-pay

## 2021-07-05 ENCOUNTER — Ambulatory Visit (HOSPITAL_BASED_OUTPATIENT_CLINIC_OR_DEPARTMENT_OTHER)
Admission: RE | Admit: 2021-07-05 | Discharge: 2021-07-05 | Disposition: A | Discharge status: Home / Self Care | Payer: Medicare HMO | Source: Ambulatory Visit | Attending: Medical | Admitting: Medical

## 2021-07-05 ENCOUNTER — Ambulatory Visit (INDEPENDENT_AMBULATORY_CARE_PROVIDER_SITE_OTHER): Payer: Medicare HMO | Admitting: Medical

## 2021-07-05 ENCOUNTER — Other Ambulatory Visit: Payer: Self-pay

## 2021-07-05 VITALS — BP 115/67 | HR 87 | Temp 98.7°F | Resp 18 | Ht 68.0 in | Wt 222.0 lb

## 2021-07-05 DIAGNOSIS — Z8616 Personal history of COVID-19: Secondary | ICD-10-CM | POA: Diagnosis not present

## 2021-07-05 DIAGNOSIS — R350 Frequency of micturition: Secondary | ICD-10-CM

## 2021-07-05 DIAGNOSIS — R059 Cough, unspecified: Secondary | ICD-10-CM

## 2021-07-05 LAB — POC URINALSYSI DIPSTICK (AUTOMATED)
Bilirubin, UA: NEGATIVE
Blood, UA: NEGATIVE
Glucose, UA: NEGATIVE
Ketones, UA: NEGATIVE
Leukocytes, UA: NEGATIVE
Nitrite, UA: NEGATIVE
Protein, UA: NEGATIVE
Spec Grav, UA: 1.015 (ref 1.010–1.025)
Urobilinogen, UA: 0.2 E.U./dL
pH, UA: 6 (ref 5.0–8.0)

## 2021-07-05 MED ORDER — HYDROCODONE BIT-HOMATROP MBR 5-1.5 MG/5ML PO SOLN
5.0000 mL | Freq: Three times a day (TID) | ORAL | 0 refills | Status: DC | PRN
  Filled 2021-07-05: qty 120, 8d supply, fill #0
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
## 2021-07-05 NOTE — Patient Instructions (Addendum)
Post covid cough. Possible some reactive airways. Will prescribe hycodan for cough, get chest xray and advise use your albuterol if needed.   Recent frequent urination. UA done in office and culture pending.  Will decide on possible antibiotic and urine culture and chest xray review.  Follow up in 7 days or as needed

## 2021-07-05 NOTE — Telephone Encounter (Signed)
Spoke with patient and she states that her cough is worse. Patient states she just finished a appointment and was given medication. Patient advised to rest and hydrate. Patient will continue to monitor symptoms.

## 2021-07-06 LAB — URINE CULTURE
MICRO NUMBER:: 12179665
SPECIMEN QUALITY:: ADEQUATE

## 2021-07-26 DIAGNOSIS — Z6833 Body mass index (BMI) 33.0-33.9, adult: Secondary | ICD-10-CM | POA: Diagnosis not present

## 2021-07-26 DIAGNOSIS — R768 Other specified abnormal immunological findings in serum: Secondary | ICD-10-CM | POA: Diagnosis not present

## 2021-07-26 DIAGNOSIS — M3500 Sicca syndrome, unspecified: Secondary | ICD-10-CM | POA: Diagnosis not present

## 2021-07-26 DIAGNOSIS — E669 Obesity, unspecified: Secondary | ICD-10-CM | POA: Diagnosis not present

## 2021-07-26 DIAGNOSIS — M5030 Other cervical disc degeneration, unspecified cervical region: Secondary | ICD-10-CM | POA: Diagnosis not present

## 2021-07-26 DIAGNOSIS — M0579 Rheumatoid arthritis with rheumatoid factor of multiple sites without organ or systems involvement: Secondary | ICD-10-CM | POA: Diagnosis not present

## 2021-07-26 DIAGNOSIS — G629 Polyneuropathy, unspecified: Secondary | ICD-10-CM | POA: Diagnosis not present

## 2021-07-26 DIAGNOSIS — Z79899 Other long term (current) drug therapy: Secondary | ICD-10-CM | POA: Diagnosis not present

## 2021-07-26 DIAGNOSIS — H9191 Unspecified hearing loss, right ear: Secondary | ICD-10-CM | POA: Diagnosis not present

## 2021-07-26 DIAGNOSIS — H209 Unspecified iridocyclitis: Secondary | ICD-10-CM | POA: Diagnosis not present

## 2021-07-29 ENCOUNTER — Encounter: Payer: Self-pay | Admitting: Neurology

## 2021-08-13 ENCOUNTER — Ambulatory Visit (INDEPENDENT_AMBULATORY_CARE_PROVIDER_SITE_OTHER): Payer: Medicare HMO | Admitting: Family Medicine

## 2021-08-13 ENCOUNTER — Encounter: Payer: Self-pay | Admitting: Family Medicine

## 2021-08-13 ENCOUNTER — Other Ambulatory Visit: Payer: Self-pay

## 2021-08-13 VITALS — Ht 68.0 in | Wt 222.0 lb

## 2021-08-13 DIAGNOSIS — M5412 Radiculopathy, cervical region: Secondary | ICD-10-CM

## 2021-08-13 HISTORY — DX: Radiculopathy, cervical region: M54.12

## 2021-08-13 MED ORDER — BACLOFEN 10 MG PO TABS: 5.0000 mg | ORAL_TABLET | Freq: Three times a day (TID) | ORAL | 0 refills | Status: DC | PRN

## 2021-08-13 MED ORDER — HYDROCODONE-ACETAMINOPHEN 5-325 MG PO TABS: 1.0000 | ORAL_TABLET | Freq: Three times a day (TID) | ORAL | 0 refills | Status: DC | PRN

## 2021-08-13 MED ORDER — KETOROLAC TROMETHAMINE 30 MG/ML IJ SOLN
30.0000 mg | Freq: Once | INTRAMUSCULAR | Status: AC
  Administered 2021-08-13: 30 mg via INTRAMUSCULAR

## 2021-08-13 MED ORDER — METHYLPREDNISOLONE ACETATE 40 MG/ML IJ SUSP
40.0000 mg | Freq: Once | INTRAMUSCULAR | Status: AC
  Administered 2021-08-13: 40 mg via INTRAMUSCULAR

## 2021-08-13 MED ORDER — PREDNISONE 5 MG PO TABS: ORAL_TABLET | ORAL | 0 refills | Status: DC
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
## 2021-08-13 NOTE — Assessment & Plan Note (Signed)
Symptoms most consistent with radicular type pain.  CT was showing impingement and narrowing. -Counseled on home exercise therapy and supportive care. -IM Toradol and Depo-Medrol. -Baclofen. -Prednisone. -Norco. -Could consider further imaging

## 2021-08-13 NOTE — Patient Instructions (Signed)
Good to see you  Please try heat  Please try the exercises  Please use the norco as needed Please send me a message in MyChart with any questions or updates.  Please see me back in 4 weeks.   --Dr. Raeford Razor

## 2021-08-14 DIAGNOSIS — M0579 Rheumatoid arthritis with rheumatoid factor of multiple sites without organ or systems involvement: Secondary | ICD-10-CM | POA: Diagnosis not present

## 2021-08-14 DIAGNOSIS — Z79899 Other long term (current) drug therapy: Secondary | ICD-10-CM | POA: Diagnosis not present

## 2021-09-02 ENCOUNTER — Other Ambulatory Visit (HOSPITAL_BASED_OUTPATIENT_CLINIC_OR_DEPARTMENT_OTHER): Payer: Self-pay | Admitting: Medical

## 2021-09-02 DIAGNOSIS — Z1231 Encounter for screening mammogram for malignant neoplasm of breast: Secondary | ICD-10-CM

## 2021-09-19 ENCOUNTER — Telehealth: Payer: Self-pay | Admitting: Family Medicine

## 2021-09-19 ENCOUNTER — Ambulatory Visit (INDEPENDENT_AMBULATORY_CARE_PROVIDER_SITE_OTHER): Payer: Medicare HMO | Admitting: Family Medicine

## 2021-09-19 ENCOUNTER — Other Ambulatory Visit: Payer: Self-pay | Admitting: Family Medicine

## 2021-09-19 ENCOUNTER — Encounter: Payer: Self-pay | Admitting: Family Medicine

## 2021-09-19 VITALS — Ht 68.0 in | Wt 218.0 lb

## 2021-09-19 DIAGNOSIS — M5412 Radiculopathy, cervical region: Secondary | ICD-10-CM | POA: Diagnosis not present

## 2021-09-19 DIAGNOSIS — M25852 Other specified joint disorders, left hip: Secondary | ICD-10-CM | POA: Insufficient documentation

## 2021-09-19 HISTORY — DX: Other specified joint disorders, left hip: M25.852

## 2021-09-19 MED ORDER — BACLOFEN 10 MG PO TABS: 5.0000 mg | ORAL_TABLET | Freq: Three times a day (TID) | ORAL | 0 refills | Status: DC | PRN

## 2021-09-19 NOTE — Assessment & Plan Note (Addendum)
Symptoms are intermittent in nature.  There is not as severe as what they were previously. -Counseled on home exercise therapy and supportive care. -Referral to physical therapy. -Counseled on baclofen. - The patient will benefit from the at home use of a Zynex NexWave with the clinically proven, TENS, IFC and NMES modalities to reduce pain, improve function and reduce medication use.  Continued use of the above drug-free treatment options are both reasonable and medically necessary at this time. -Could consider an epidural

## 2021-09-19 NOTE — Assessment & Plan Note (Signed)
Having pain over the left posterior compartment.  More likely related to an imbalance. -Counseled on home exercise therapy and supportive care. -Referral to physical therapy. -Could consider imaging.

## 2021-09-19 NOTE — Telephone Encounter (Signed)
Refill sent. See meds.  

## 2021-09-19 NOTE — Patient Instructions (Signed)
Good to see you Please try the zynex machine  Please try physical therapy   Please send me a message in MyChart with any questions or updates.  Please see me back in 4-6 weeks.   --Dr. Raeford Razor
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
## 2021-09-19 NOTE — Telephone Encounter (Signed)
Patient called to request Rx :  baclofen (LIORESAL) 10 MG tablet [[id number]]    Order Details Dose: 5 mg Route: Oral Frequency: 3 times daily PRN for muscle spasms  Dispense Quantity: 30 each Refills: 0        Sig: Take 0.5 tablets (5 mg total) by mouth 3 (three) times daily as needed for muscle spasms.       Start Date: 08/13/21 End Date: --     _-Per patient provider adjusted dosage to ( 1 ) 5 MG pill daily  --forwarding to med asst.  --glh

## 2021-09-23 ENCOUNTER — Other Ambulatory Visit: Payer: Self-pay

## 2021-09-23 ENCOUNTER — Encounter (HOSPITAL_BASED_OUTPATIENT_CLINIC_OR_DEPARTMENT_OTHER): Payer: Medicare HMO

## 2021-09-23 ENCOUNTER — Encounter: Payer: Self-pay | Admitting: Neurology

## 2021-09-23 ENCOUNTER — Ambulatory Visit (INDEPENDENT_AMBULATORY_CARE_PROVIDER_SITE_OTHER): Payer: Medicare HMO | Admitting: Neurology

## 2021-09-23 VITALS — BP 121/80 | HR 75 | Ht 68.0 in | Wt 222.0 lb

## 2021-09-23 DIAGNOSIS — R202 Paresthesia of skin: Secondary | ICD-10-CM | POA: Diagnosis not present

## 2021-09-23 DIAGNOSIS — Z1231 Encounter for screening mammogram for malignant neoplasm of breast: Secondary | ICD-10-CM

## 2021-09-23 DIAGNOSIS — M5412 Radiculopathy, cervical region: Secondary | ICD-10-CM | POA: Diagnosis not present

## 2021-09-23 DIAGNOSIS — M25852 Other specified joint disorders, left hip: Secondary | ICD-10-CM | POA: Diagnosis not present

## 2021-09-23 NOTE — Patient Instructions (Addendum)
Nerve testing of right the arm and leg.  Do not apply lotion or oil to your skin on the day of testing.   ELECTROMYOGRAM AND NERVE CONDUCTION STUDIES (EMG/NCS) INSTRUCTIONS  How to Prepare The neurologist conducting the EMG will need to know if you have certain medical conditions. Tell the neurologist and other EMG lab personnel if you: Have a pacemaker or any other electrical medical device Take blood-thinning medications Have hemophilia, a blood-clotting disorder that causes prolonged bleeding Bathing Take a shower or bath shortly before your exam in order to remove oils from your skin. Don't apply lotions or creams before the exam.  What to Expect You'll likely be asked to change into a hospital gown for the procedure and lie down on an examination table. The following explanations can help you understand what will happen during the exam.  Electrodes. The neurologist or a technician places surface electrodes at various locations on your skin depending on where you're experiencing symptoms. Or the neurologist may insert needle electrodes at different sites depending on your symptoms.  Sensations. The electrodes will at times transmit a tiny electrical current that you may feel as a twinge or spasm. The needle electrode may cause discomfort or pain that usually ends shortly after the needle is removed. If you are concerned about discomfort or pain, you may want to talk to the neurologist about taking a short break during the exam.  Instructions. During the needle EMG, the neurologist will assess whether there is any spontaneous electrical activity when the muscle is at rest - activity that isn't present in healthy muscle tissue - and the degree of activity when you slightly contract the muscle.  He or she will give you instructions on resting and contracting a muscle at appropriate times. Depending on what muscles and nerves the neurologist is examining, he or she may ask you to change positions  during the exam.  After your EMG You may experience some temporary, minor bruising where the needle electrode was inserted into your muscle. This bruising should fade within several days. If it persists, contact your primary care doctor.
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
## 2021-09-25 ENCOUNTER — Other Ambulatory Visit: Payer: Self-pay

## 2021-09-25 DIAGNOSIS — R7689 Other specified abnormal immunological findings in serum: Secondary | ICD-10-CM | POA: Insufficient documentation

## 2021-09-25 DIAGNOSIS — M3500 Sicca syndrome, unspecified: Secondary | ICD-10-CM | POA: Insufficient documentation

## 2021-09-25 DIAGNOSIS — R700 Elevated erythrocyte sedimentation rate: Secondary | ICD-10-CM

## 2021-09-25 DIAGNOSIS — Z79899 Other long term (current) drug therapy: Secondary | ICD-10-CM

## 2021-09-25 DIAGNOSIS — R5383 Other fatigue: Secondary | ICD-10-CM

## 2021-09-25 DIAGNOSIS — M79609 Pain in unspecified limb: Secondary | ICD-10-CM

## 2021-09-25 DIAGNOSIS — G629 Polyneuropathy, unspecified: Secondary | ICD-10-CM

## 2021-09-25 DIAGNOSIS — H8102 Meniere's disease, left ear: Secondary | ICD-10-CM

## 2021-09-25 DIAGNOSIS — R768 Other specified abnormal immunological findings in serum: Secondary | ICD-10-CM

## 2021-09-25 DIAGNOSIS — H9190 Unspecified hearing loss, unspecified ear: Secondary | ICD-10-CM

## 2021-09-25 HISTORY — DX: Pain in unspecified limb: M79.609

## 2021-09-25 HISTORY — DX: Other long term (current) drug therapy: Z79.899

## 2021-09-25 HISTORY — DX: Unspecified hearing loss, unspecified ear: H91.90

## 2021-09-25 HISTORY — DX: Other fatigue: R53.83

## 2021-09-25 HISTORY — DX: Sjogren syndrome, unspecified: M35.00

## 2021-09-25 HISTORY — DX: Meniere's disease, left ear: H81.02

## 2021-09-25 HISTORY — DX: Polyneuropathy, unspecified: G62.9

## 2021-09-25 HISTORY — DX: Elevated erythrocyte sedimentation rate: R70.0

## 2021-09-25 HISTORY — DX: Other specified abnormal immunological findings in serum: R76.8

## 2021-09-30 ENCOUNTER — Other Ambulatory Visit: Payer: Self-pay

## 2021-09-30 ENCOUNTER — Ambulatory Visit (HOSPITAL_BASED_OUTPATIENT_CLINIC_OR_DEPARTMENT_OTHER)
Admission: RE | Admit: 2021-09-30 | Discharge: 2021-09-30 | Disposition: A | Discharge status: Home / Self Care | Payer: Medicare HMO | Source: Ambulatory Visit | Attending: Medical | Admitting: Medical

## 2021-09-30 ENCOUNTER — Encounter (HOSPITAL_BASED_OUTPATIENT_CLINIC_OR_DEPARTMENT_OTHER): Payer: Self-pay

## 2021-09-30 ENCOUNTER — Ambulatory Visit: Payer: Medicare HMO | Attending: Internal Medicine

## 2021-09-30 DIAGNOSIS — Z1231 Encounter for screening mammogram for malignant neoplasm of breast: Secondary | ICD-10-CM | POA: Diagnosis not present

## 2021-09-30 DIAGNOSIS — Z23 Encounter for immunization: Secondary | ICD-10-CM

## 2021-09-30 NOTE — Progress Notes (Signed)
   Covid-19 Vaccination Clinic  Name:  SHWETA AMAN    MRN: [id number] DOB: [birth year]  09/30/2021  Ms. Wiens was observed post Covid-19 immunization for 15 minutes without incident. She was provided with Vaccine Information Sheet and instruction to access the V-Safe system.   Ms. Blankenburg was instructed to call 911 with any severe reactions post vaccine: Difficulty breathing  Swelling of face and throat  A fast heartbeat  A bad rash all over body  Dizziness and weakness   Immunizations Administered     Name Date Dose VIS Date Route   Pfizer Covid-19 Vaccine Bivalent Booster 09/30/2021 10:57 AM 0.3 mL 08/07/2021 Intramuscular   Manufacturer: [place]   Lot: [id number]   [place]: [phone]

## 2021-10-03 DIAGNOSIS — M25852 Other specified joint disorders, left hip: Secondary | ICD-10-CM | POA: Diagnosis not present

## 2021-10-03 DIAGNOSIS — M5412 Radiculopathy, cervical region: Secondary | ICD-10-CM | POA: Diagnosis not present

## 2021-10-10 DIAGNOSIS — M0579 Rheumatoid arthritis with rheumatoid factor of multiple sites without organ or systems involvement: Secondary | ICD-10-CM | POA: Diagnosis not present

## 2021-10-10 DIAGNOSIS — Z79899 Other long term (current) drug therapy: Secondary | ICD-10-CM | POA: Diagnosis not present

## 2021-10-11 ENCOUNTER — Ambulatory Visit (INDEPENDENT_AMBULATORY_CARE_PROVIDER_SITE_OTHER): Payer: Medicare HMO | Admitting: Cardiology

## 2021-10-11 ENCOUNTER — Encounter: Payer: Self-pay | Admitting: Cardiology

## 2021-10-11 ENCOUNTER — Other Ambulatory Visit: Payer: Self-pay

## 2021-10-11 VITALS — BP 112/70 | HR 89 | Ht 68.0 in | Wt 223.0 lb

## 2021-10-11 DIAGNOSIS — I10 Essential (primary) hypertension: Secondary | ICD-10-CM | POA: Diagnosis not present

## 2021-10-11 DIAGNOSIS — E782 Mixed hyperlipidemia: Secondary | ICD-10-CM | POA: Diagnosis not present

## 2021-10-11 DIAGNOSIS — G4733 Obstructive sleep apnea (adult) (pediatric): Secondary | ICD-10-CM | POA: Diagnosis not present

## 2021-10-11 DIAGNOSIS — E669 Obesity, unspecified: Secondary | ICD-10-CM | POA: Diagnosis not present
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
## 2021-10-11 NOTE — Patient Instructions (Signed)
Medication Instructions:  Your physician recommends that you continue on your current medications as directed. Please refer to the Current Medication list given to you today.  *If you need a refill on your cardiac medications before your next appointment, please call your pharmacy*   Lab Work: Your physician recommends that you return for lab work in: the next few days. You need to have labs done when you are fasting.  You can come Monday through Friday 8:30 am to 12:00 pm and 1:15 to 4:30. You do not need to make an appointment as the order has already been placed. The labs you are going to have done are BMET, LFT and Lipids.  If you have labs (blood work) drawn today and your tests are completely normal, you will receive your results only by: [place] (if you have MyChart) OR A paper copy in the mail If you have any lab test that is abnormal or we need to change your treatment, we will call you to review the results.   Testing/Procedures: None ordered   Follow-Up: At [facility], you and your health needs are our priority.  As part of our continuing mission to provide you with exceptional heart care, we have created designated Provider Care Teams.  These Care Teams include your primary Cardiologist (physician) and Advanced Practice Providers (APPs -  Physician Assistants and Nurse Practitioners) who all work together to provide you with the care you need, when you need it.  We recommend signing up for the patient portal called "MyChart".  Sign up information is provided on this After Visit Summary.  MyChart is used to connect with patients for Virtual Visits (Telemedicine).  Patients are able to view lab/test results, encounter notes, upcoming appointments, etc.  Non-urgent messages can be sent to your provider as well.   To learn more about what you can do with MyChart, go to [email or web address].    Your next appointment:   6 month(s)  The format for your next  appointment:   In Person  Provider:   Jyl Heinz, MD   Other Instructions NA

## 2021-10-12 ENCOUNTER — Other Ambulatory Visit: Payer: Self-pay | Admitting: Family Medicine

## 2021-10-12 ENCOUNTER — Other Ambulatory Visit: Payer: Self-pay | Admitting: Medical

## 2021-10-12 DIAGNOSIS — I10 Essential (primary) hypertension: Secondary | ICD-10-CM

## 2021-10-14 ENCOUNTER — Ambulatory Visit: Payer: Medicare HMO | Attending: Family Medicine | Admitting: Physical Therapy

## 2021-10-14 ENCOUNTER — Encounter: Payer: Self-pay | Admitting: Physical Therapy

## 2021-10-14 ENCOUNTER — Other Ambulatory Visit: Payer: Self-pay

## 2021-10-14 DIAGNOSIS — M62838 Other muscle spasm: Secondary | ICD-10-CM | POA: Insufficient documentation

## 2021-10-14 DIAGNOSIS — M25552 Pain in left hip: Secondary | ICD-10-CM | POA: Insufficient documentation

## 2021-10-14 DIAGNOSIS — M5412 Radiculopathy, cervical region: Secondary | ICD-10-CM | POA: Diagnosis not present

## 2021-10-14 DIAGNOSIS — M25652 Stiffness of left hip, not elsewhere classified: Secondary | ICD-10-CM | POA: Diagnosis not present

## 2021-10-14 DIAGNOSIS — M542 Cervicalgia: Secondary | ICD-10-CM | POA: Insufficient documentation

## 2021-10-14 DIAGNOSIS — M25852 Other specified joint disorders, left hip: Secondary | ICD-10-CM | POA: Diagnosis not present
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
## 2021-10-16 ENCOUNTER — Ambulatory Visit: Payer: Medicare HMO

## 2021-10-16 ENCOUNTER — Other Ambulatory Visit: Payer: Self-pay

## 2021-10-16 DIAGNOSIS — M5412 Radiculopathy, cervical region: Secondary | ICD-10-CM | POA: Diagnosis not present

## 2021-10-16 DIAGNOSIS — M62838 Other muscle spasm: Secondary | ICD-10-CM

## 2021-10-16 DIAGNOSIS — M542 Cervicalgia: Secondary | ICD-10-CM | POA: Diagnosis not present

## 2021-10-16 DIAGNOSIS — M25852 Other specified joint disorders, left hip: Secondary | ICD-10-CM | POA: Diagnosis not present

## 2021-10-16 DIAGNOSIS — M25652 Stiffness of left hip, not elsewhere classified: Secondary | ICD-10-CM | POA: Diagnosis not present

## 2021-10-16 DIAGNOSIS — M25552 Pain in left hip: Secondary | ICD-10-CM | POA: Diagnosis not present
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
## 2021-10-21 ENCOUNTER — Other Ambulatory Visit: Payer: Self-pay

## 2021-10-21 ENCOUNTER — Ambulatory Visit: Payer: Medicare HMO | Admitting: Physical Therapy

## 2021-10-21 ENCOUNTER — Encounter: Payer: Self-pay | Admitting: Physical Therapy

## 2021-10-21 DIAGNOSIS — M542 Cervicalgia: Secondary | ICD-10-CM | POA: Diagnosis not present

## 2021-10-21 DIAGNOSIS — M25652 Stiffness of left hip, not elsewhere classified: Secondary | ICD-10-CM | POA: Diagnosis not present

## 2021-10-21 DIAGNOSIS — M62838 Other muscle spasm: Secondary | ICD-10-CM

## 2021-10-21 DIAGNOSIS — M5412 Radiculopathy, cervical region: Secondary | ICD-10-CM | POA: Diagnosis not present

## 2021-10-21 DIAGNOSIS — M25552 Pain in left hip: Secondary | ICD-10-CM

## 2021-10-21 DIAGNOSIS — M25852 Other specified joint disorders, left hip: Secondary | ICD-10-CM | POA: Diagnosis not present
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
## 2021-10-22 ENCOUNTER — Ambulatory Visit (INDEPENDENT_AMBULATORY_CARE_PROVIDER_SITE_OTHER): Payer: Medicare HMO | Admitting: Neurology

## 2021-10-22 DIAGNOSIS — R202 Paresthesia of skin: Secondary | ICD-10-CM | POA: Diagnosis not present

## 2021-10-22 NOTE — Procedures (Signed)
[facility]  [address] [place]  [place], [place] [address] Tel: [phone] Fax:  [phone] Test Date:  10/22/2021  Patient: Judy [name] DOB: Apr 16, 1951 Physician: Narda Amber, DO  Sex: Female Height: 5\' 8"  Ref Phys: Narda Amber, DO  ID#: [id number]   Technician:    Patient Complaints: This is a 70 year old female referred for evaluation of paresthesias of the hands and feet  NCV & EMG Findings: Extensive electrodiagnostic testing of the right upper and lower extremity shows:  Right median, ulnar, mixed palmar, sural, and superficial peroneal sensory responses are within normal limits. Right median, ulnar, peroneal, and tibial motor responses are within normal limits. Right tibial H reflex study is within normal limits. There is no evidence of active or chronic motor axonal loss changes affecting any of the tested muscles.  Motor unit configuration and recruitment pattern is within normal limits.  Impression: This is a normal study of the right upper and lower extremities.    In particular, there is no evidence of a large fiber sensorimotor polyneuropathy, cervical/lumbosacral radiculopathy, or carpal tunnel syndrome   ___________________________ Narda Amber, DO    Nerve Conduction Studies Anti Sensory Summary Table   Stim Site NR Peak (ms) Norm Peak (ms) P-T Amp (V) Norm P-T Amp  Right Median Anti Sensory (2nd Digit)  34C  Wrist    2.9 <3.8 20.0 >10  Right Sup Peroneal Anti Sensory (Ant Lat Mall)  34C  12 cm    2.1 <4.6 6.5 >3  Right Sural Anti Sensory (Lat Mall)  34C  Calf    2.6 <4.6 17.4 >3  Right Ulnar Anti Sensory (5th Digit)  34C  Wrist    2.4 <3.2 22.1 >5   Motor Summary Table   Stim Site NR Onset (ms) Norm Onset (ms) O-P Amp (mV) Norm O-P Amp Site1 Site2 Delta-0 (ms) Dist (cm) Vel (m/s) Norm Vel (m/s)  Right Median Motor (Abd Poll Brev)  34C  Wrist    3.3 <4.0 11.4 >5 Elbow Wrist 4.7 29.0 62 >50  Elbow    8.0  10.2          Right Peroneal Motor (Ext Dig Brev)  34C  Ankle    3.2 <6.0 7.0 >2.5 B Fib Ankle 7.8 38.0 49 >40  B Fib    11.0  6.6  Poplt B Fib 1.3 8.0 62 >40  Poplt    12.3  6.5         Right Tibial Motor (Abd Hall Brev)  34C  Ankle    3.2 <6.0 6.9 >4 Knee Ankle 9.0 43.0 48 >40  Knee    12.2  3.1         Right Ulnar Motor (Abd Dig Minimi)  34C  Wrist    2.0 <3.1 7.3 >7 B Elbow Wrist 3.6 22.0 61 >50  B Elbow    5.6  6.5  A Elbow B Elbow 1.8 10.0 56 >50  A Elbow    7.4  6.4          Comparison Summary Table   Stim Site NR Peak (ms) Norm Peak (ms) P-T Amp (V) Site1 Site2 Delta-P (ms) Norm Delta (ms)  Right Median/Ulnar Palm Comparison (Wrist - 8cm)  34C  Median Palm    1.8 <2.2 49.1 Median Palm Ulnar Palm 0.3   Ulnar Palm    1.5 <2.2 11.7       H Reflex Studies   NR H-Lat (ms) Lat Norm (ms) L-R H-Lat (  ms)  Right Tibial (Gastroc)  34C     30.34 <35    EMG   Side Muscle Ins Act Fibs Psw Fasc Number Recrt Dur Dur. Amp Amp. Poly Poly. Comment  Right AntTibialis Nml Nml Nml Nml Nml Nml Nml Nml Nml Nml Nml Nml N/A  Right Gastroc Nml Nml Nml Nml Nml Nml Nml Nml Nml Nml Nml Nml N/A  Right Flex Dig Long Nml Nml Nml Nml Nml Nml Nml Nml Nml Nml Nml Nml N/A  Right RectFemoris Nml Nml Nml Nml Nml Nml Nml Nml Nml Nml Nml Nml N/A  Right BicepsFemS Nml Nml Nml Nml Nml Nml Nml Nml Nml Nml Nml Nml N/A  Right 1stDorInt Nml Nml Nml Nml Nml Nml Nml Nml Nml Nml Nml Nml N/A  Right PronatorTeres Nml Nml Nml Nml Nml Nml Nml Nml Nml Nml Nml Nml N/A  Right Biceps Nml Nml Nml Nml Nml Nml Nml Nml Nml Nml Nml Nml N/A  Right Triceps Nml Nml Nml Nml Nml Nml Nml Nml Nml Nml Nml Nml N/A  Right Deltoid Nml Nml Nml Nml Nml Nml Nml Nml Nml Nml Nml Nml N/A      Waveforms:

## 2021-10-23 ENCOUNTER — Ambulatory Visit: Payer: Medicare HMO

## 2021-10-23 ENCOUNTER — Other Ambulatory Visit: Payer: Self-pay

## 2021-10-23 DIAGNOSIS — M542 Cervicalgia: Secondary | ICD-10-CM

## 2021-10-23 DIAGNOSIS — M25552 Pain in left hip: Secondary | ICD-10-CM | POA: Diagnosis not present

## 2021-10-23 DIAGNOSIS — M5412 Radiculopathy, cervical region: Secondary | ICD-10-CM | POA: Diagnosis not present

## 2021-10-23 DIAGNOSIS — M25852 Other specified joint disorders, left hip: Secondary | ICD-10-CM | POA: Diagnosis not present

## 2021-10-23 DIAGNOSIS — M25652 Stiffness of left hip, not elsewhere classified: Secondary | ICD-10-CM | POA: Diagnosis not present

## 2021-10-23 DIAGNOSIS — M62838 Other muscle spasm: Secondary | ICD-10-CM
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
## 2021-10-24 DIAGNOSIS — E782 Mixed hyperlipidemia: Secondary | ICD-10-CM | POA: Diagnosis not present

## 2021-10-24 DIAGNOSIS — I10 Essential (primary) hypertension: Secondary | ICD-10-CM | POA: Diagnosis not present

## 2021-10-24 DIAGNOSIS — M5412 Radiculopathy, cervical region: Secondary | ICD-10-CM | POA: Diagnosis not present

## 2021-10-24 DIAGNOSIS — M25852 Other specified joint disorders, left hip: Secondary | ICD-10-CM | POA: Diagnosis not present

## 2021-10-25 ENCOUNTER — Telehealth: Payer: Self-pay | Admitting: Emergency Medicine

## 2021-10-25 ENCOUNTER — Other Ambulatory Visit (HOSPITAL_BASED_OUTPATIENT_CLINIC_OR_DEPARTMENT_OTHER): Payer: Self-pay

## 2021-10-25 DIAGNOSIS — E782 Mixed hyperlipidemia: Secondary | ICD-10-CM

## 2021-10-25 LAB — LIPID PANEL
Chol/HDL Ratio: 4.1 ratio (ref 0.0–4.4)
Cholesterol, Total: 273 mg/dL — ABNORMAL HIGH (ref 100–199)
HDL: 66 mg/dL (ref >39)
LDL Chol Calc (NIH): 191 mg/dL — ABNORMAL HIGH (ref 0–99)
Triglycerides: 93 mg/dL (ref 0–149)
VLDL Cholesterol Cal: 16 mg/dL (ref 5–40)

## 2021-10-25 LAB — HEPATIC FUNCTION PANEL
ALT: 12 IU/L (ref 0–32)
AST: 17 IU/L (ref 0–40)
Albumin: 4.4 g/dL (ref 3.8–4.8)
Alkaline Phosphatase: 84 IU/L (ref 44–121)
Bilirubin Total: 0.8 mg/dL (ref 0.0–1.2)
Bilirubin, Direct: 0.13 mg/dL (ref 0.00–0.40)
Total Protein: 7 g/dL (ref 6.0–8.5)

## 2021-10-25 LAB — BASIC METABOLIC PANEL
BUN/Creatinine Ratio: 14 (ref 12–28)
BUN: 15 mg/dL (ref 8–27)
CO2: 23 mmol/L (ref 20–29)
Calcium: 9.5 mg/dL (ref 8.7–10.3)
Chloride: 105 mmol/L (ref 96–106)
Creatinine, Ser: 1.08 mg/dL — ABNORMAL HIGH (ref 0.57–1.00)
Glucose: 104 mg/dL — ABNORMAL HIGH (ref 70–99)
Potassium: 4.3 mmol/L (ref 3.5–5.2)
Sodium: 143 mmol/L (ref 134–144)
eGFR: 56 mL/min/{1.73_m2} — ABNORMAL LOW (ref >59)

## 2021-10-25 MED ORDER — ROSUVASTATIN CALCIUM 20 MG PO TABS: 20.0000 mg | ORAL_TABLET | Freq: Every day | ORAL | 1 refills | Status: DC

## 2021-10-25 MED ORDER — COENZYME Q10 200 MG PO CAPS: 200.0000 mg | ORAL_CAPSULE | Freq: Every day | ORAL | 1 refills | Status: DC

## 2021-10-25 MED ORDER — LIVALO 2 MG PO TABS: 2.0000 mg | ORAL_TABLET | Freq: Every day | ORAL | 1 refills | Status: DC

## 2021-10-25 MED ORDER — PFIZER COVID-19 VAC BIVALENT 30 MCG/0.3ML IM SUSP
INTRAMUSCULAR | 0 refills | Status: DC
  Filled 2021-10-25: qty 0.3, 1d supply, fill #0

## 2021-10-25 NOTE — Telephone Encounter (Signed)
Spoke to patient. Informed her that per Dr. Geraldo Pitter she is to have a dietician referral. Also informed her that Dr. Geraldo Pitter would like her to start livalo 2 mg daily and co-enzyme q 10 (200 mg daily) in stead of the crestor. Patient aware she will still repeat blood work in 6 weeks.    Spoke to Raquel C. With [employer] informed her to cancel the crestor RX and confirmed that the patient does need the livalo and coq 10 rx. She understood and made a note of this.

## 2021-10-25 NOTE — Addendum Note (Signed)
Addended by: Senaida Ores on: 10/25/2021 12:24 PM   Modules accepted: Orders

## 2021-10-25 NOTE — Telephone Encounter (Signed)
Referral placed for nutritionist/dietician.

## 2021-10-25 NOTE — Telephone Encounter (Signed)
-----   Message from Jenean Lindau, MD sent at 10/25/2021 10:04 AM EST ----- Please keep appointment with dietitian.  Crestor 20 mg daily and liver lipid check in 6 weeks.  Copy primary care. Jenean Lindau, MD 10/25/2021 10:04 AM

## 2021-10-25 NOTE — Telephone Encounter (Signed)
Patient informed of results. She does not have a appointment with a dietician or a referral. Her only upcoming appointments are for rehab-physical therapy. She wants me to confirm that Dr. Geraldo Pitter wants her to see a dietician. She states this was never mentioned in her appointment so she wanted to be sure if that is what he wanted.   Also she said she wants [place] to send in crestor 20 mg daily but she is going to do her own research before starting it. She had issues with lipitor causing muscle aches before so she wanted to research more before starting it but still asked I send the medication in. Patient aware to have fasting blood work done if she does start crestor in 6 weeks. I did inform patient these medication are within the same class.   Will check with Dr. Geraldo Pitter regarding dietician.

## 2021-10-27 ENCOUNTER — Other Ambulatory Visit: Payer: Self-pay | Admitting: Cardiology

## 2021-10-28 ENCOUNTER — Encounter: Payer: Self-pay | Admitting: Physical Therapy

## 2021-10-28 ENCOUNTER — Ambulatory Visit: Payer: Medicare HMO | Admitting: Physical Therapy

## 2021-10-28 ENCOUNTER — Other Ambulatory Visit: Payer: Self-pay

## 2021-10-28 DIAGNOSIS — M25552 Pain in left hip: Secondary | ICD-10-CM | POA: Diagnosis not present

## 2021-10-28 DIAGNOSIS — M62838 Other muscle spasm: Secondary | ICD-10-CM | POA: Diagnosis not present

## 2021-10-28 DIAGNOSIS — M25652 Stiffness of left hip, not elsewhere classified: Secondary | ICD-10-CM | POA: Diagnosis not present

## 2021-10-28 DIAGNOSIS — M25852 Other specified joint disorders, left hip: Secondary | ICD-10-CM | POA: Diagnosis not present

## 2021-10-28 DIAGNOSIS — M542 Cervicalgia: Secondary | ICD-10-CM | POA: Diagnosis not present

## 2021-10-28 DIAGNOSIS — M5412 Radiculopathy, cervical region: Secondary | ICD-10-CM | POA: Diagnosis not present
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
## 2021-10-29 ENCOUNTER — Ambulatory Visit: Payer: Medicare HMO | Admitting: Family Medicine

## 2021-10-30 ENCOUNTER — Encounter: Payer: Medicare HMO | Admitting: Physical Therapy

## 2021-11-03 DIAGNOSIS — M25852 Other specified joint disorders, left hip: Secondary | ICD-10-CM | POA: Diagnosis not present

## 2021-11-03 DIAGNOSIS — M5412 Radiculopathy, cervical region: Secondary | ICD-10-CM | POA: Diagnosis not present

## 2021-11-05 ENCOUNTER — Ambulatory Visit (INDEPENDENT_AMBULATORY_CARE_PROVIDER_SITE_OTHER): Payer: Medicare HMO | Admitting: Family Medicine

## 2021-11-05 ENCOUNTER — Ambulatory Visit: Payer: Medicare HMO | Admitting: Family Medicine

## 2021-11-05 ENCOUNTER — Encounter: Payer: Self-pay | Admitting: Physical Therapy

## 2021-11-05 ENCOUNTER — Ambulatory Visit: Payer: Medicare HMO | Admitting: Physical Therapy

## 2021-11-05 ENCOUNTER — Encounter: Payer: Self-pay | Admitting: Family Medicine

## 2021-11-05 ENCOUNTER — Other Ambulatory Visit: Payer: Self-pay

## 2021-11-05 DIAGNOSIS — M542 Cervicalgia: Secondary | ICD-10-CM

## 2021-11-05 DIAGNOSIS — M62838 Other muscle spasm: Secondary | ICD-10-CM

## 2021-11-05 DIAGNOSIS — M25852 Other specified joint disorders, left hip: Secondary | ICD-10-CM | POA: Diagnosis not present

## 2021-11-05 DIAGNOSIS — M25552 Pain in left hip: Secondary | ICD-10-CM

## 2021-11-05 DIAGNOSIS — M25652 Stiffness of left hip, not elsewhere classified: Secondary | ICD-10-CM | POA: Diagnosis not present

## 2021-11-05 DIAGNOSIS — M5412 Radiculopathy, cervical region: Secondary | ICD-10-CM | POA: Diagnosis not present

## 2021-11-05 NOTE — Assessment & Plan Note (Signed)
Pain is intermittent in nature but has gotten improvement with physical therapy. -Counseled on home exercise therapy and supportive care. -Continue physical therapy. -Could consider shockwave therapy.

## 2021-11-05 NOTE — Assessment & Plan Note (Signed)
Has been improved with physical therapy and the zynex.  -Counseled on home exercise therapy and supportive care. -Can continue physical therapy. -Could consider epidural.
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
## 2021-11-05 NOTE — Patient Instructions (Signed)
Access Code: [email or web address] URL: [email or web address] Date: 11/05/2021 Prepared by: Glenetta Hew  Exercises Standing Hip Abduction with Counter Support - 1 x daily - 7 x weekly - 3 sets - 10 reps Heel Raises with Counter Support - 1 x daily - 7 x weekly - 3 sets - 10 reps Standing Hip Extension with Counter Support - 1 x daily - 7 x weekly - 3 sets - 10 reps Clamshell with Resistance - 1 x daily - 7 x weekly - 3 sets - 10 reps

## 2021-11-06 ENCOUNTER — Encounter: Payer: Self-pay | Admitting: Medical

## 2021-11-06 ENCOUNTER — Encounter: Payer: Self-pay | Admitting: Neurology

## 2021-11-06 DIAGNOSIS — R202 Paresthesia of skin: Secondary | ICD-10-CM

## 2021-11-07 ENCOUNTER — Ambulatory Visit: Payer: Medicare HMO | Attending: Family Medicine | Admitting: Physical Therapy

## 2021-11-07 ENCOUNTER — Other Ambulatory Visit: Payer: Self-pay

## 2021-11-07 ENCOUNTER — Encounter: Payer: Self-pay | Admitting: Physical Therapy

## 2021-11-07 DIAGNOSIS — M542 Cervicalgia: Secondary | ICD-10-CM | POA: Diagnosis not present

## 2021-11-07 DIAGNOSIS — M25652 Stiffness of left hip, not elsewhere classified: Secondary | ICD-10-CM | POA: Diagnosis not present

## 2021-11-07 DIAGNOSIS — M62838 Other muscle spasm: Secondary | ICD-10-CM

## 2021-11-07 DIAGNOSIS — M25552 Pain in left hip: Secondary | ICD-10-CM | POA: Diagnosis not present
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
## 2021-11-07 IMAGING — CR DG RIBS W/ CHEST 3+V*R*
3 series · 3 of 3 positions shown · non-contrast
Comparison: Chest x-ray 01/13/2019, CT cardiac 09/26/2020

CLINICAL DATA: Status post fall.

EXAM:
RIGHT RIBS AND CHEST - 3+ VIEW

[w chest pa]
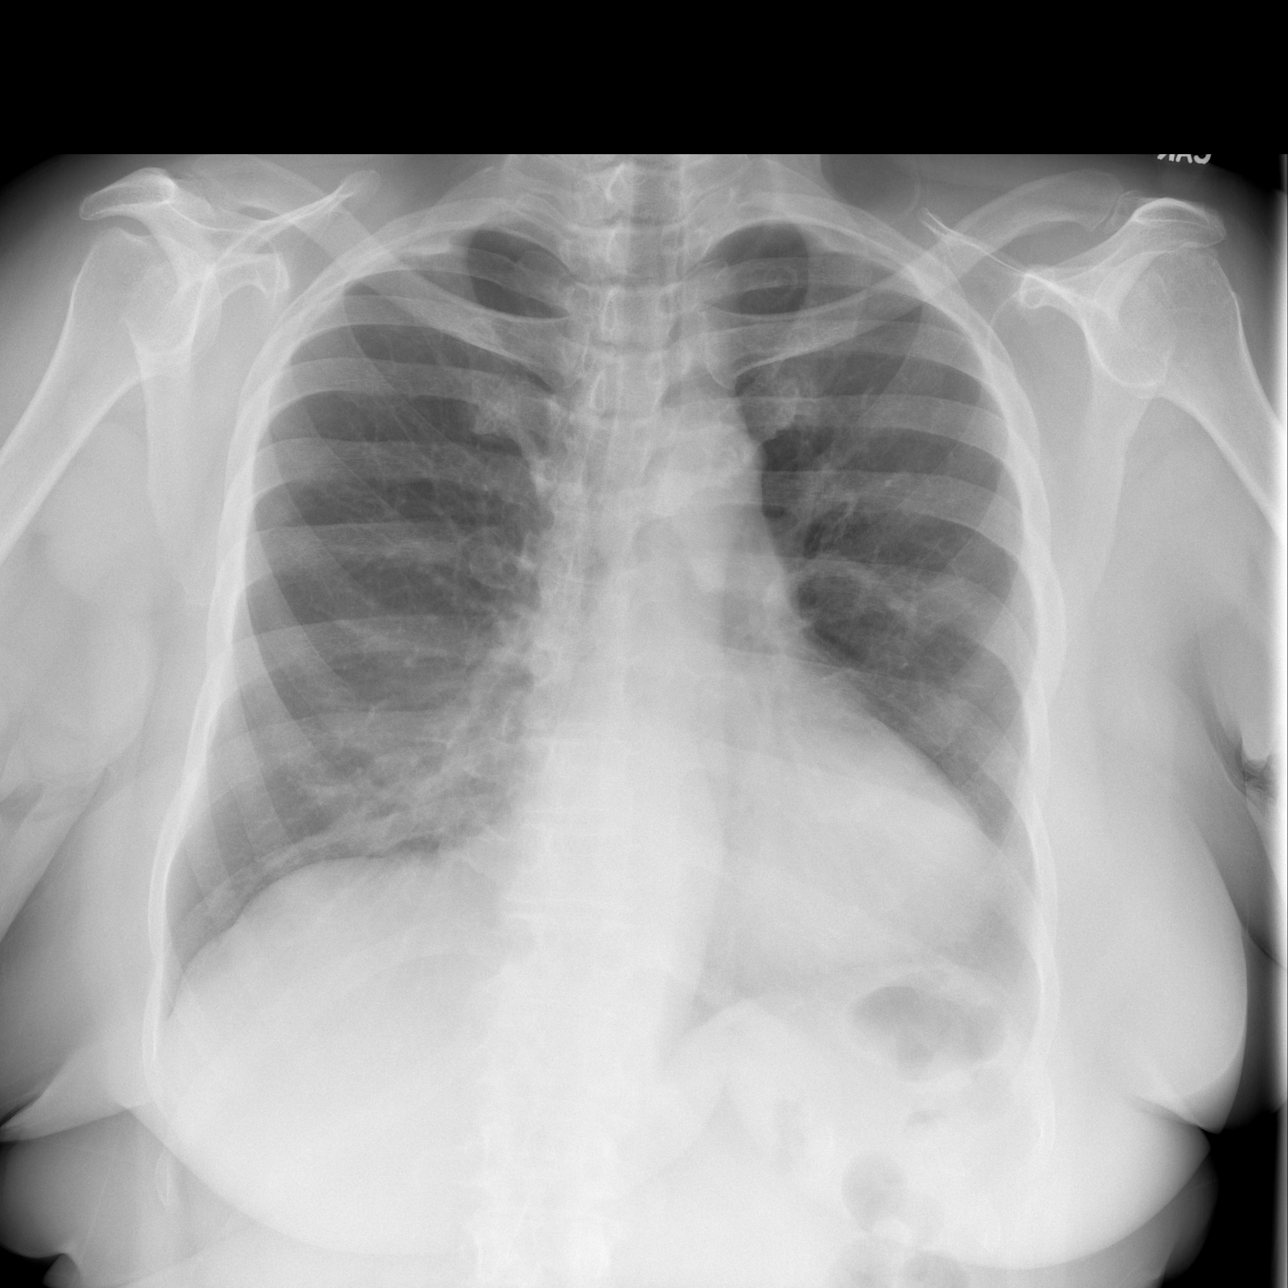

[w ribs ap/pa lower right]
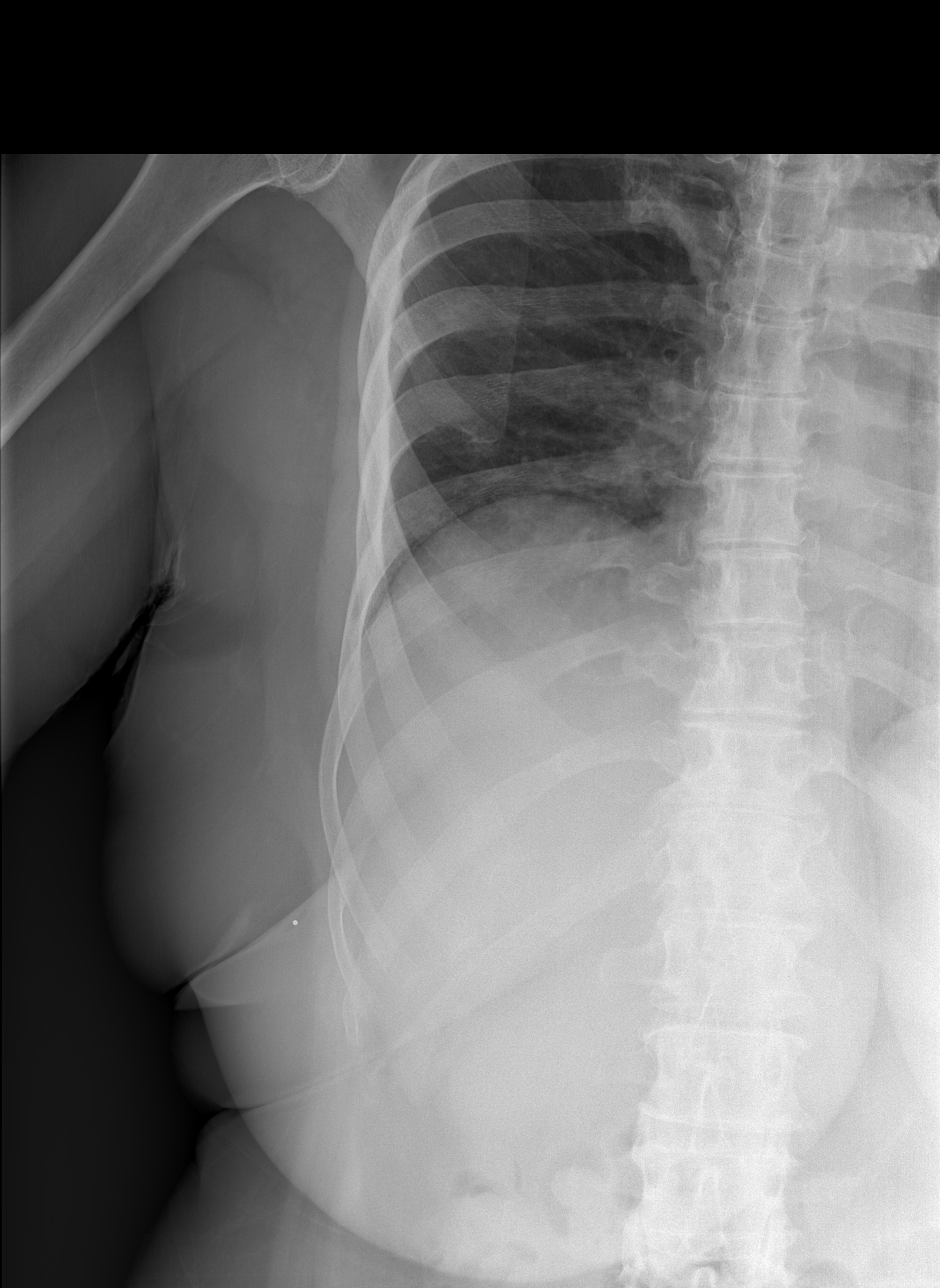

[w ribs oblique right]
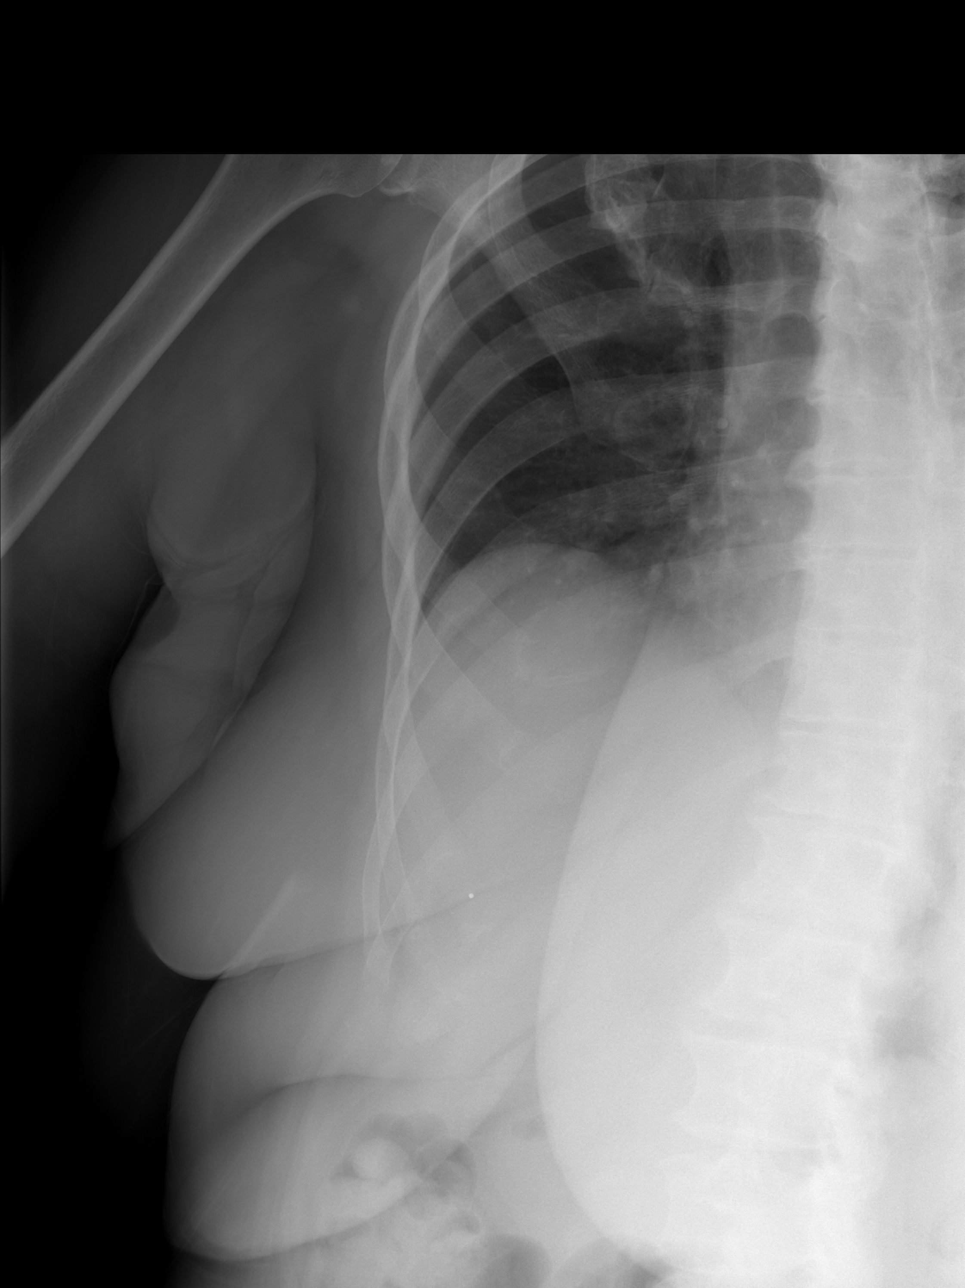

[3 of 3 positions shown; findings below may reference images not displayed]

FINDINGS: Punctate BB marker noted overlying the lower right ribs. No acute
displaced fracture or other bone lesions are seen involving the
ribs. There is no evidence of pneumothorax or pleural effusion. Both
lungs are clear. Heart size and mediastinal contours are within
normal limits.
IMPRESSION: 1. Negative.
2. Please note, nondisplaced rib fractures may be occult on
radiograph.
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
## 2021-11-08 ENCOUNTER — Ambulatory Visit (INDEPENDENT_AMBULATORY_CARE_PROVIDER_SITE_OTHER): Payer: Medicare HMO | Admitting: Medical

## 2021-11-08 VITALS — BP 139/69 | HR 65 | Resp 18 | Ht 68.0 in | Wt 224.0 lb

## 2021-11-08 DIAGNOSIS — M1990 Unspecified osteoarthritis, unspecified site: Secondary | ICD-10-CM

## 2021-11-08 DIAGNOSIS — I10 Essential (primary) hypertension: Secondary | ICD-10-CM | POA: Diagnosis not present

## 2021-11-08 DIAGNOSIS — E7849 Other hyperlipidemia: Secondary | ICD-10-CM

## 2021-11-08 DIAGNOSIS — R69 Illness, unspecified: Secondary | ICD-10-CM | POA: Diagnosis not present

## 2021-11-08 DIAGNOSIS — F32A Depression, unspecified: Secondary | ICD-10-CM

## 2021-11-08 MED ORDER — BUPROPION HCL ER (XL) 150 MG PO TB24: 150.0000 mg | ORAL_TABLET | Freq: Every day | ORAL | 2 refills | Status: DC

## 2021-11-08 NOTE — Patient Instructions (Addendum)
Depression. Placed referral to behavioral health. Hopeful that will be helpful. Discussed use of meds and willing to restart wellbutrin which you had been on before.   For arthtitis continue medrol daily and celebrex add on if needed.  Htn decently controlled. Continue atacand.  High cholesterol. Continue creator and repeat lipid panel in 6 weeks.  Follow up in one month or sooner if needed.
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
## 2021-11-11 ENCOUNTER — Other Ambulatory Visit: Payer: Self-pay

## 2021-11-11 ENCOUNTER — Ambulatory Visit (INDEPENDENT_AMBULATORY_CARE_PROVIDER_SITE_OTHER): Payer: Medicare HMO | Admitting: Podiatry

## 2021-11-11 DIAGNOSIS — G609 Hereditary and idiopathic neuropathy, unspecified: Secondary | ICD-10-CM | POA: Diagnosis not present

## 2021-11-12 ENCOUNTER — Ambulatory Visit: Payer: Medicare HMO | Admitting: Physical Therapy

## 2021-11-12 ENCOUNTER — Encounter: Payer: Self-pay | Admitting: Physical Therapy

## 2021-11-12 DIAGNOSIS — M62838 Other muscle spasm: Secondary | ICD-10-CM

## 2021-11-12 DIAGNOSIS — M542 Cervicalgia: Secondary | ICD-10-CM

## 2021-11-12 DIAGNOSIS — M25552 Pain in left hip: Secondary | ICD-10-CM | POA: Diagnosis not present

## 2021-11-12 DIAGNOSIS — M25652 Stiffness of left hip, not elsewhere classified: Secondary | ICD-10-CM
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
## 2021-11-12 NOTE — Progress Notes (Signed)
  Subjective:  Patient ID: Judy [name], female    DOB: 01-12-1951,  MRN: [id number]  Chief Complaint  Patient presents with   Peripheral Neuropathy    (New Patient) Diagnosis: R20.2 (ICD-10-CM) - Paresthesia of both feet  Referring Provider: Alda Berthold     70 y.o. female presents with the above complaint. History confirmed with patient.  She was referred to me by Dr. Posey Pronto for the possibility of a small nerve fiber density biopsy.  She has been having worsening numbness and paresthesias on the outside of both feet for the last few years.  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  She has no history of diabetes, heavy alcohol use, HIV, B12 deficiency.  She did have likely toxic exposure to benzene while living in [facility].  Objective:  Physical Exam: warm, good capillary refill, no trophic changes or ulcerative lesions, normal DP and PT pulses, normal monofilament exam, and normal sensory exam.  EMG and NCV with no abnormalities Assessment:   1. Idiopathic peripheral neuropathy      Plan:  Patient was evaluated and treated and all questions answered.  Discussed my clinical exam findings with the patient we discussed the etiologies and treatment options for neuropathy in detail.  So far she has not had any diagnostic tests that have a confirmed diagnosis however she clearly has symptoms consistent with a peripheral neuropathy.  Likely her protective sensation is intact so from a foot standpoint I think she should be likely free of consequences, proprioception may be an issue for her when ambulating.  I discussed with her that I am happy to do the nerve density biopsy if this allows [place] to get more of a diagnosis but often the results from this are inconclusive or do not necessarily guide to a particular treatment.  We discussed treatment with medications including gabapentin  and/or topical medications which she would like to hold off on for now.  We will plan to do the biopsy next week in the office  Return in about 8 days (around 11/19/2021) for nerve biopsy bilateral.

## 2021-11-13 ENCOUNTER — Telehealth: Payer: Self-pay

## 2021-11-13 NOTE — Telephone Encounter (Signed)
PA started on CMM for Livalo 2 mg. Key [id number]

## 2021-11-14 ENCOUNTER — Other Ambulatory Visit: Payer: Self-pay

## 2021-11-14 ENCOUNTER — Ambulatory Visit: Payer: Medicare HMO

## 2021-11-14 DIAGNOSIS — M62838 Other muscle spasm: Secondary | ICD-10-CM | POA: Diagnosis not present

## 2021-11-14 DIAGNOSIS — M542 Cervicalgia: Secondary | ICD-10-CM | POA: Diagnosis not present

## 2021-11-14 DIAGNOSIS — M25652 Stiffness of left hip, not elsewhere classified: Secondary | ICD-10-CM | POA: Diagnosis not present

## 2021-11-14 DIAGNOSIS — M25552 Pain in left hip: Secondary | ICD-10-CM

## 2021-11-14 MED ORDER — LIVALO 2 MG PO TABS: 2.0000 mg | ORAL_TABLET | Freq: Every day | ORAL | 1 refills | Status: DC
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
## 2021-11-14 NOTE — Telephone Encounter (Signed)
PA approved on CMM for [place] from 12/08/20-12/07/21.

## 2021-11-18 ENCOUNTER — Encounter: Payer: Self-pay | Admitting: Physical Therapy

## 2021-11-18 ENCOUNTER — Other Ambulatory Visit: Payer: Self-pay

## 2021-11-18 ENCOUNTER — Ambulatory Visit: Payer: Medicare HMO | Admitting: Physical Therapy

## 2021-11-18 DIAGNOSIS — M25552 Pain in left hip: Secondary | ICD-10-CM

## 2021-11-18 DIAGNOSIS — M542 Cervicalgia: Secondary | ICD-10-CM

## 2021-11-18 DIAGNOSIS — M25652 Stiffness of left hip, not elsewhere classified: Secondary | ICD-10-CM

## 2021-11-18 DIAGNOSIS — M62838 Other muscle spasm: Secondary | ICD-10-CM | POA: Diagnosis not present
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
## 2021-11-19 ENCOUNTER — Ambulatory Visit (INDEPENDENT_AMBULATORY_CARE_PROVIDER_SITE_OTHER): Payer: Medicare HMO | Admitting: Podiatry

## 2021-11-19 DIAGNOSIS — G609 Hereditary and idiopathic neuropathy, unspecified: Secondary | ICD-10-CM

## 2021-11-19 DIAGNOSIS — R2990 Unspecified symptoms and signs involving the nervous system: Secondary | ICD-10-CM | POA: Diagnosis not present

## 2021-11-19 NOTE — Progress Notes (Signed)
  Subjective:  Patient ID: Judy [name], female    DOB: [birth year],  MRN: [id number]  Chief Complaint  Patient presents with   Procedure      nerve biopsy bilateral    70 y.o. female presents today for epidermal nerve fiber density testing.  In brief she has a history consistent with an idiopathic peripheral neuropathy and has been having worsening numbness and paresthesias on the outside of both feet for the last few years. She has no history of diseases that would predispose her to a peripheral neuropathy including diabetes, heavy alcohol use, HIV, B12 deficiency.  Her nerve conduction testing and electromyography tests were normal on 10/22/2021  Procedure in detail:  Following sterile prep with alcohol a local proximal field block and IV fashion was performed with 1.5 cc each of 2% Xylocaine and 0.5% Marcaine with epinephrine.  An additional 2 cc of lidocaine plain and 2% was used on the left side.  The biopsy site was prepped with povidone iodine solution.  A punch biopsy was taken 10 cm proximal to the distal fibula on both the left and right legs.  Hemostasis was achieved and 3-0 nylon was used to coapt the biopsy site.  An adhesive dry sterile dressing was applied.  She tolerated the procedure well.  Plan:  Patient was evaluated and treated and all questions answered.  -May remove bandage and shower in 2 days -Return for suture removal in 2 weeks -Hopeful to have biopsy results by her 2-week visit otherwise we will discuss over the phone with her  Return in about 2 weeks (around 12/03/2021) for suture removal.

## 2021-11-21 ENCOUNTER — Encounter: Payer: Medicare HMO | Admitting: Physical Therapy

## 2021-11-22 ENCOUNTER — Ambulatory Visit: Payer: Medicare HMO

## 2021-11-22 ENCOUNTER — Other Ambulatory Visit: Payer: Self-pay

## 2021-11-22 DIAGNOSIS — M62838 Other muscle spasm: Secondary | ICD-10-CM

## 2021-11-22 DIAGNOSIS — M542 Cervicalgia: Secondary | ICD-10-CM

## 2021-11-22 DIAGNOSIS — M25652 Stiffness of left hip, not elsewhere classified: Secondary | ICD-10-CM | POA: Diagnosis not present

## 2021-11-22 DIAGNOSIS — M25552 Pain in left hip: Secondary | ICD-10-CM | POA: Diagnosis not present
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
## 2021-11-23 DIAGNOSIS — M5412 Radiculopathy, cervical region: Secondary | ICD-10-CM | POA: Diagnosis not present

## 2021-11-23 DIAGNOSIS — M25852 Other specified joint disorders, left hip: Secondary | ICD-10-CM | POA: Diagnosis not present

## 2021-11-25 ENCOUNTER — Other Ambulatory Visit: Payer: Self-pay

## 2021-11-25 ENCOUNTER — Ambulatory Visit: Payer: Medicare HMO | Admitting: Physical Therapy

## 2021-11-25 ENCOUNTER — Encounter: Payer: Self-pay | Admitting: Physical Therapy

## 2021-11-25 DIAGNOSIS — M62838 Other muscle spasm: Secondary | ICD-10-CM

## 2021-11-25 DIAGNOSIS — M542 Cervicalgia: Secondary | ICD-10-CM

## 2021-11-25 DIAGNOSIS — M25652 Stiffness of left hip, not elsewhere classified: Secondary | ICD-10-CM

## 2021-11-25 DIAGNOSIS — M25552 Pain in left hip: Secondary | ICD-10-CM
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
## 2021-12-03 ENCOUNTER — Ambulatory Visit: Payer: Medicare HMO | Admitting: Podiatry

## 2021-12-03 DIAGNOSIS — M5412 Radiculopathy, cervical region: Secondary | ICD-10-CM | POA: Diagnosis not present

## 2021-12-03 DIAGNOSIS — M25852 Other specified joint disorders, left hip: Secondary | ICD-10-CM | POA: Diagnosis not present

## 2021-12-05 ENCOUNTER — Ambulatory Visit: Payer: Medicare HMO | Admitting: Podiatry

## 2021-12-05 DIAGNOSIS — Z79899 Other long term (current) drug therapy: Secondary | ICD-10-CM | POA: Diagnosis not present

## 2021-12-05 DIAGNOSIS — M0579 Rheumatoid arthritis with rheumatoid factor of multiple sites without organ or systems involvement: Secondary | ICD-10-CM | POA: Diagnosis not present

## 2021-12-10 ENCOUNTER — Ambulatory Visit (INDEPENDENT_AMBULATORY_CARE_PROVIDER_SITE_OTHER): Payer: Medicare HMO | Admitting: Podiatry

## 2021-12-10 ENCOUNTER — Other Ambulatory Visit: Payer: Self-pay

## 2021-12-10 DIAGNOSIS — G609 Hereditary and idiopathic neuropathy, unspecified: Secondary | ICD-10-CM | POA: Diagnosis not present

## 2021-12-11 ENCOUNTER — Ambulatory Visit (INDEPENDENT_AMBULATORY_CARE_PROVIDER_SITE_OTHER): Payer: Medicare HMO | Admitting: Medical

## 2021-12-11 VITALS — BP 108/64 | HR 68 | Temp 98.6°F | Resp 18 | Ht 68.0 in | Wt 219.0 lb

## 2021-12-11 DIAGNOSIS — E7849 Other hyperlipidemia: Secondary | ICD-10-CM | POA: Diagnosis not present

## 2021-12-11 DIAGNOSIS — F32A Depression, unspecified: Secondary | ICD-10-CM

## 2021-12-11 DIAGNOSIS — M1990 Unspecified osteoarthritis, unspecified site: Secondary | ICD-10-CM

## 2021-12-11 DIAGNOSIS — R739 Hyperglycemia, unspecified: Secondary | ICD-10-CM

## 2021-12-11 DIAGNOSIS — R69 Illness, unspecified: Secondary | ICD-10-CM | POA: Diagnosis not present

## 2021-12-11 DIAGNOSIS — Z7185 Encounter for immunization safety counseling: Secondary | ICD-10-CM | POA: Diagnosis not present

## 2021-12-11 DIAGNOSIS — I10 Essential (primary) hypertension: Secondary | ICD-10-CM

## 2021-12-11 LAB — CBC WITH DIFFERENTIAL/PLATELET
Basophils Absolute: 0.1 10*3/uL (ref 0.0–0.1)
Basophils Relative: 0.9 % (ref 0.0–3.0)
Eosinophils Absolute: 0.1 10*3/uL (ref 0.0–0.7)
Eosinophils Relative: 1.3 % (ref 0.0–5.0)
HCT: 37 % (ref 36.0–46.0)
Hemoglobin: 11.8 g/dL — ABNORMAL LOW (ref 12.0–15.0)
Lymphocytes Relative: 31.6 % (ref 12.0–46.0)
Lymphs Abs: 2 10*3/uL (ref 0.7–4.0)
MCHC: 32.1 g/dL (ref 30.0–36.0)
MCV: 86 fl (ref 78.0–100.0)
Monocytes Absolute: 0.5 10*3/uL (ref 0.1–1.0)
Monocytes Relative: 8.2 % (ref 3.0–12.0)
Neutro Abs: 3.6 10*3/uL (ref 1.4–7.7)
Neutrophils Relative %: 58 % (ref 43.0–77.0)
Platelets: 313 10*3/uL (ref 150.0–400.0)
RBC: 4.3 Mil/uL (ref 3.87–5.11)
RDW: 15.4 % (ref 11.5–15.5)
WBC: 6.2 10*3/uL (ref 4.0–10.5)

## 2021-12-11 LAB — HEMOGLOBIN A1C: Hgb A1c MFr Bld: 6.1 % (ref 4.6–6.5)

## 2021-12-11 NOTE — Patient Instructions (Addendum)
Depression significantly improved with Wellbutrin. Continue current dose.   Htn-very tightly controlled/rechecked today. Check cbc today/blood volume. Stay well hydrated. Check bp daily. If less than 184 systolic consistently let me know.Might need to consider dose reduction of atacand.  For RA continue current med regimen prescribed by specialist.  For high cholesterol continue crestor.  Vaccine counseling regarding flu vaccine.  On review mild sugar elevation. Will get a1c.  Follow up date to be determined after lab review.If lab normal and blood pressure stable would recommend follow up in about 4 months.
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
## 2021-12-12 NOTE — Progress Notes (Signed)
°  Subjective:  Patient ID: Judy [name], female    DOB: 1951/09/22,  MRN: [id number]  Chief Complaint  Patient presents with   Peripheral Neuropathy     post nerve biopsy 2 wk f/u    71 y.o. female presents today for epidermal nerve fiber density testing.  In brief she has a history consistent with an idiopathic peripheral neuropathy and has been having worsening numbness and paresthesias on the outside of both feet for the last few years. She has no history of diseases that would predispose her to a peripheral neuropathy including diabetes, heavy alcohol use, HIV, B12 deficiency.  Her nerve conduction testing and electromyography tests were normal on 10/22/2021  Interval history: She returns for follow-up after her peripheral nerve fiber density biopsy.  Objective:  Sutures healing well no signs of infection  Pathology results of the bilateral calf biopsy site shows normative range of intraepidermal nerve fiber counts.  Mild morphologic degenerative changes are seen among intraepidermal nerve fibers  Plan:  Patient was evaluated and treated and all questions answered.  -Sutures removed today. -I reviewed the results of her biopsy with her.  Discussed that the test is essentially inconclusive she currently does not meet criteria based on this exact test for peripheral neuropathy but the morphologic changes could be predictive of development of future neuropathy.  Discussed with her that the inconclusive results does not mean she does not have neuropathy because she clearly clinically has symptoms of this.  She is tried multiple things at this point including dietary changes going to a more vegan diet anti-inflammatory diet to see if this makes an improvement and I think all possibilities are worth a try at this point.  If she was having paresthesias and severe pain gabapentin may help but I do not think this likely would improve much with the numbness.  I did discuss with her we could repeat  the test in about a year and see if there is a change in the fiber density.  She will follow-up with me as needed at this point.  Return if symptoms worsen or fail to improve.

## 2021-12-24 DIAGNOSIS — M25852 Other specified joint disorders, left hip: Secondary | ICD-10-CM | POA: Diagnosis not present

## 2021-12-24 DIAGNOSIS — M5412 Radiculopathy, cervical region: Secondary | ICD-10-CM | POA: Diagnosis not present

## 2021-12-27 ENCOUNTER — Other Ambulatory Visit: Payer: Self-pay | Admitting: Medical

## 2021-12-30 NOTE — Telephone Encounter (Signed)
Okay to refill. It has a high interaction warning. Pt was last seen 12/11/21

## 2022-01-03 DIAGNOSIS — M5412 Radiculopathy, cervical region: Secondary | ICD-10-CM | POA: Diagnosis not present

## 2022-01-03 DIAGNOSIS — M25852 Other specified joint disorders, left hip: Secondary | ICD-10-CM | POA: Diagnosis not present

## 2022-01-03 NOTE — Telephone Encounter (Signed)
I have called the pt and informed her of the message and she will follow up with Rheumatology. Pt reports that she has been taking the combination of medication for years and she has been fine on it.

## 2022-01-09 DIAGNOSIS — E782 Mixed hyperlipidemia: Secondary | ICD-10-CM | POA: Diagnosis not present

## 2022-01-10 ENCOUNTER — Other Ambulatory Visit: Payer: Self-pay

## 2022-01-10 LAB — HEPATIC FUNCTION PANEL
ALT: 15 IU/L (ref 0–32)
AST: 22 IU/L (ref 0–40)
Albumin: 4.3 g/dL (ref 3.8–4.8)
Alkaline Phosphatase: 79 IU/L (ref 44–121)
Bilirubin Total: 0.6 mg/dL (ref 0.0–1.2)
Bilirubin, Direct: 0.14 mg/dL (ref 0.00–0.40)
Total Protein: 7 g/dL (ref 6.0–8.5)

## 2022-01-10 LAB — LIPID PANEL
Chol/HDL Ratio: 4 ratio (ref 0.0–4.4)
Cholesterol, Total: 259 mg/dL — ABNORMAL HIGH (ref 100–199)
HDL: 65 mg/dL (ref >39)
LDL Chol Calc (NIH): 174 mg/dL — ABNORMAL HIGH (ref 0–99)
Triglycerides: 115 mg/dL (ref 0–149)
VLDL Cholesterol Cal: 20 mg/dL (ref 5–40)

## 2022-01-10 NOTE — Progress Notes (Signed)
Pt report she is taking Pitvastatin.

## 2022-01-13 ENCOUNTER — Encounter: Payer: Self-pay | Admitting: Cardiology

## 2022-01-24 DIAGNOSIS — M25852 Other specified joint disorders, left hip: Secondary | ICD-10-CM | POA: Diagnosis not present

## 2022-01-24 DIAGNOSIS — M5412 Radiculopathy, cervical region: Secondary | ICD-10-CM | POA: Diagnosis not present

## 2022-01-30 ENCOUNTER — Other Ambulatory Visit: Payer: Self-pay | Admitting: Medical

## 2022-01-30 DIAGNOSIS — M0579 Rheumatoid arthritis with rheumatoid factor of multiple sites without organ or systems involvement: Secondary | ICD-10-CM | POA: Diagnosis not present

## 2022-01-30 DIAGNOSIS — Z79899 Other long term (current) drug therapy: Secondary | ICD-10-CM | POA: Diagnosis not present

## 2022-02-03 DIAGNOSIS — M5412 Radiculopathy, cervical region: Secondary | ICD-10-CM | POA: Diagnosis not present

## 2022-02-03 DIAGNOSIS — M25852 Other specified joint disorders, left hip: Secondary | ICD-10-CM | POA: Diagnosis not present

## 2022-02-12 ENCOUNTER — Ambulatory Visit (INDEPENDENT_AMBULATORY_CARE_PROVIDER_SITE_OTHER): Payer: Medicare HMO | Admitting: Psychology

## 2022-02-12 DIAGNOSIS — F4321 Adjustment disorder with depressed mood: Secondary | ICD-10-CM | POA: Diagnosis not present

## 2022-02-12 DIAGNOSIS — R69 Illness, unspecified: Secondary | ICD-10-CM | POA: Diagnosis not present
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
## 2022-02-13 ENCOUNTER — Ambulatory Visit: Payer: Medicare HMO | Admitting: Psychology

## 2022-02-14 DIAGNOSIS — Z79899 Other long term (current) drug therapy: Secondary | ICD-10-CM | POA: Diagnosis not present

## 2022-02-14 DIAGNOSIS — Z6832 Body mass index (BMI) 32.0-32.9, adult: Secondary | ICD-10-CM | POA: Diagnosis not present

## 2022-02-14 DIAGNOSIS — H209 Unspecified iridocyclitis: Secondary | ICD-10-CM | POA: Diagnosis not present

## 2022-02-14 DIAGNOSIS — M0579 Rheumatoid arthritis with rheumatoid factor of multiple sites without organ or systems involvement: Secondary | ICD-10-CM | POA: Diagnosis not present

## 2022-02-14 DIAGNOSIS — M3500 Sicca syndrome, unspecified: Secondary | ICD-10-CM | POA: Diagnosis not present

## 2022-02-14 DIAGNOSIS — R768 Other specified abnormal immunological findings in serum: Secondary | ICD-10-CM | POA: Diagnosis not present

## 2022-02-14 DIAGNOSIS — H9191 Unspecified hearing loss, right ear: Secondary | ICD-10-CM | POA: Diagnosis not present

## 2022-02-14 DIAGNOSIS — M5030 Other cervical disc degeneration, unspecified cervical region: Secondary | ICD-10-CM | POA: Diagnosis not present

## 2022-02-14 DIAGNOSIS — G629 Polyneuropathy, unspecified: Secondary | ICD-10-CM | POA: Diagnosis not present

## 2022-02-14 DIAGNOSIS — E669 Obesity, unspecified: Secondary | ICD-10-CM | POA: Diagnosis not present

## 2022-02-19 ENCOUNTER — Other Ambulatory Visit (INDEPENDENT_AMBULATORY_CARE_PROVIDER_SITE_OTHER): Payer: Medicare HMO

## 2022-02-19 ENCOUNTER — Telehealth (INDEPENDENT_AMBULATORY_CARE_PROVIDER_SITE_OTHER): Payer: Medicare HMO | Admitting: Medical

## 2022-02-19 VITALS — BP 133/74 | HR 74

## 2022-02-19 DIAGNOSIS — R739 Hyperglycemia, unspecified: Secondary | ICD-10-CM

## 2022-02-19 DIAGNOSIS — R69 Illness, unspecified: Secondary | ICD-10-CM | POA: Diagnosis not present

## 2022-02-19 DIAGNOSIS — F32A Depression, unspecified: Secondary | ICD-10-CM

## 2022-02-19 DIAGNOSIS — K219 Gastro-esophageal reflux disease without esophagitis: Secondary | ICD-10-CM | POA: Diagnosis not present

## 2022-02-19 DIAGNOSIS — M1990 Unspecified osteoarthritis, unspecified site: Secondary | ICD-10-CM

## 2022-02-19 DIAGNOSIS — M79672 Pain in left foot: Secondary | ICD-10-CM | POA: Diagnosis not present

## 2022-02-19 DIAGNOSIS — M79671 Pain in right foot: Secondary | ICD-10-CM | POA: Diagnosis not present

## 2022-02-19 DIAGNOSIS — R8290 Unspecified abnormal findings in urine: Secondary | ICD-10-CM

## 2022-02-19 DIAGNOSIS — R059 Cough, unspecified: Secondary | ICD-10-CM

## 2022-02-19 DIAGNOSIS — R350 Frequency of micturition: Secondary | ICD-10-CM

## 2022-02-19 DIAGNOSIS — R5383 Other fatigue: Secondary | ICD-10-CM

## 2022-02-19 DIAGNOSIS — Z111 Encounter for screening for respiratory tuberculosis: Secondary | ICD-10-CM

## 2022-02-19 LAB — POC URINALSYSI DIPSTICK (AUTOMATED)
Bilirubin, UA: NEGATIVE
Blood, UA: NEGATIVE
Glucose, UA: NEGATIVE
Ketones, UA: NEGATIVE
Nitrite, UA: POSITIVE
Protein, UA: NEGATIVE
Spec Grav, UA: 1.01 (ref 1.010–1.025)
Urobilinogen, UA: 0.2 E.U./dL
pH, UA: 6 (ref 5.0–8.0)

## 2022-02-19 LAB — COMPREHENSIVE METABOLIC PANEL
ALT: 11 U/L (ref 0–35)
AST: 15 U/L (ref 0–37)
Albumin: 4.1 g/dL (ref 3.5–5.2)
Alkaline Phosphatase: 72 U/L (ref 39–117)
BUN: 24 mg/dL — ABNORMAL HIGH (ref 6–23)
CO2: 32 mEq/L (ref 19–32)
Calcium: 9.4 mg/dL (ref 8.4–10.5)
Chloride: 103 mEq/L (ref 96–112)
Creatinine, Ser: 1.06 mg/dL (ref 0.40–1.20)
GFR: 53.98 mL/min — ABNORMAL LOW (ref >60.00)
Glucose, Bld: 93 mg/dL (ref 70–99)
Potassium: 4.2 mEq/L (ref 3.5–5.1)
Sodium: 140 mEq/L (ref 135–145)
Total Bilirubin: 0.7 mg/dL (ref 0.2–1.2)
Total Protein: 6.6 g/dL (ref 6.0–8.3)

## 2022-02-19 LAB — CBC WITH DIFFERENTIAL/PLATELET
Basophils Absolute: 0 10*3/uL (ref 0.0–0.1)
Basophils Relative: 0.5 % (ref 0.0–3.0)
Eosinophils Absolute: 0.1 10*3/uL (ref 0.0–0.7)
Eosinophils Relative: 1.3 % (ref 0.0–5.0)
HCT: 36.1 % (ref 36.0–46.0)
Hemoglobin: 11.9 g/dL — ABNORMAL LOW (ref 12.0–15.0)
Lymphocytes Relative: 30.1 % (ref 12.0–46.0)
Lymphs Abs: 2 10*3/uL (ref 0.7–4.0)
MCHC: 32.8 g/dL (ref 30.0–36.0)
MCV: 87.1 fl (ref 78.0–100.0)
Monocytes Absolute: 0.5 10*3/uL (ref 0.1–1.0)
Monocytes Relative: 8 % (ref 3.0–12.0)
Neutro Abs: 4.1 10*3/uL (ref 1.4–7.7)
Neutrophils Relative %: 60.1 % (ref 43.0–77.0)
Platelets: 317 10*3/uL (ref 150.0–400.0)
RBC: 4.15 Mil/uL (ref 3.87–5.11)
RDW: 15.3 % (ref 11.5–15.5)
WBC: 6.8 10*3/uL (ref 4.0–10.5)

## 2022-02-19 LAB — VITAMIN B12: Vitamin B-12: 744 pg/mL (ref 211–911)

## 2022-02-19 LAB — TSH: TSH: 1.26 u[IU]/mL (ref 0.35–5.50)

## 2022-02-19 LAB — C-REACTIVE PROTEIN: CRP: <1 mg/dL (ref 0.5–20.0)

## 2022-02-19 LAB — SEDIMENTATION RATE: Sed Rate: 18 mm/hr (ref 0–30)

## 2022-02-19 LAB — URIC ACID: Uric Acid, Serum: 5.1 mg/dL (ref 2.4–7.0)

## 2022-02-19 LAB — T4, FREE: Free T4: 0.81 ng/dL (ref 0.60–1.60)

## 2022-02-19 MED ORDER — BUPROPION HCL ER (XL) 300 MG PO TB24: 300.0000 mg | ORAL_TABLET | Freq: Every day | ORAL | 3 refills | Status: DC

## 2022-02-19 NOTE — Addendum Note (Signed)
Addended by: Kelle Darting A on: 02/19/2022 11:45 AM ? ? Modules accepted: Orders ? ?

## 2022-02-19 NOTE — Patient Instructions (Addendum)
Depression- phq-9 score of 12. Discussed with pt and agreed on increasing wellburin xl to 300 mg dail. ? ?Arthritis with recent pain more in feet than other areas- recent flare improved after taper prednisone and steroid injection. Will get inflammatory labs and include uric acid level. ? ?Fatigue- cbc, cmp, tsh, t4, b12 and iron level.  ? ? ?Urine odor with increased frequency of urination- poct ua and urine culture. ? ?Cough- don't think allergy related as had steroid injection, taper prednisone and on zyrtec. If you do have allergy type symptoms can add flonase nasal spray. Consider reflux flare. Continue protonix. If cough worsening particulary after meals can add famotadine. If cough persist then get chest xray. ? ? ?Follow up date to be determined after lab review. If labs normal would recommend one month to assess how doing with increase wellbutrin. ? ? ?

## 2022-02-19 NOTE — Addendum Note (Signed)
Addended by: Manuela Schwartz on: 02/19/2022 10:06 AM ? ? Modules accepted: Orders ? ?

## 2022-02-19 NOTE — Progress Notes (Signed)
? ?Subjective:  ? ? Patient ID: Judy [name], female    DOB: 01-Jul-1951, 71 y.o.   MRN: [id number] ? ?HPI ?Virtual Visit via Video Note ? ?I connected with Judy [name] on 02/19/22 at  8:20 AM EDT by a video enabled telemedicine application and verified that I am speaking with the correct person using two identifiers. ? ?Location: ?Patient: home ?Provider: office ?  ?I discussed the limitations of evaluation and management by telemedicine and the availability of in person appointments. The patient expressed understanding and agreed to proceed. ? ?History of Present Illness: ?Pt states she has slowed down a lot. She states when initialy started wellbutrin she was more motivated and had more energy. Some depressed mood per phq-9 score of 12. But just recently she states took 4 hours to clean her kitchen. Took longer than expected due to fatigue, pain and getting distracted(not finishing) ? ?Pt states around that time her feet started to hurt. Also had hip pain. Rheumatologist increased steroid dose. No changes to infusion dose. Describes briefly had taper dose and steroid injection. ? ?She expresses frustration related to pain from RA. ? ?Odor to urine for one week. ? ?Cough recently. Specialist thought maybe reflux flare.  ?  ?Observations/Objective: ? ?General-no acute distress, pleasant, oriented. ?Lungs- on inspection lungs appear unlabored. ?Neck- no tracheal deviation or jvd on inspection. ?Neuro- gross motor function appears intact.  ? ?Assessment and Plan: ? ?Patient Instructions  ?Depression- phq-9 score of 12. Discussed with pt and agreed on increasing wellburin xl to 300 mg dail. ? ?Arthritis with recent pain more in feet than other areas- recent flare improved after taper prednisone and steroid injection. Will get inflammatory labs and include uric acid level. ? ?Fatigue- cbc, cmp, tsh, t4, b12 and iron level.  ? ? ?Urine odor with increased frequency of urination- poct ua and urine culture. ? ?Cough- don't  think allergy related as had steroid injection, taper prednisone and on zyrtec. If you do have allergy type symptoms can add flonase nasal spray. Consider reflux flare. Continue protonix. If cough worsening particulary after meals can add famotadine. If cough persist then get chest xray. ? ? ?Follow up date to be determined after lab review. If labs normal would recommend one month to assess how doing with increase wellbutrin. ? ?  ?Mackie Pai, PA-C  ? ? ?Follow Up Instructions: ? ?  ?I discussed the assessment and treatment plan with the patient. The patient was provided an opportunity to ask questions and all were answered. The patient agreed with the plan and demonstrated an understanding of the instructions. ?  ?The patient was advised to call back or seek an in-person evaluation if the symptoms worsen or if the condition fails to improve as anticipated. ? ? ?Mackie Pai, PA-C  ? ?Review of Systems  ?HENT:  Negative for congestion.   ?Respiratory:  Negative for cough, chest tightness, shortness of breath and wheezing.   ?Cardiovascular:  Negative for chest pain and palpitations.  ?Gastrointestinal:  Negative for abdominal pain, blood in stool, constipation, diarrhea and nausea.  ?Genitourinary:  Positive for frequency. Negative for hematuria, pelvic pain and urgency.  ?     Odor to urine.  ?Musculoskeletal:  Positive for arthralgias. Negative for back pain and myalgias.  ?Skin:  Negative for rash.  ?Neurological:  Negative for facial asymmetry, speech difficulty, weakness and headaches.  ?Hematological:  Negative for adenopathy. Does not bruise/bleed easily.  ?Psychiatric/Behavioral:  Negative for behavioral problems and confusion.   ? ? ?  Past Medical History:  ?Diagnosis Date  ? Abnormal CT of spine 03/28/2013  ? ?? 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 Overview:  ?? 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 Anemia   ? sickle cell trait  ? Axillary mass 02/07/2013  ? Benign essential  hypertension 06/14/2012  ? Formatting of this note might be different from the original. Last Assessment & Plan:  Well controlled, no changes to meds. Encouraged heart healthy diet such as the DASH diet and exercise as tolerated.  Overview:  Last Assessment & Plan:  Well controlled.  Continue current medications and low sodium Dash type diet. Formatting of this note might be different from the original. Last Assessment & Pl  ? BPPV (benign paroxysmal positional vertigo), left 03/06/2021  ? Calcific tendinitis of right shoulder 01/31/2021  ? Cardiac murmur 09/18/2020  ? Cataract 2020  ? bilateral  ? Cervical radiculopathy 08/13/2021  ? Depression   ? Dysplasia of cervix   ? Elevated erythrocyte sedimentation rate 09/25/2021  ? Essential hypertension 09/18/2020  ? Family history of breast cancer in sister 01/18/2013  ? CA in paternal half sister x 2 and maternal GGM  Formatting of this note might be different from the original. Overview:  CA in paternal half sister x 2 and maternal GGM  ? Fatigue 09/25/2021  ? Gastric polyp 01/18/2013  ? Dr Ferdinand Lango.  Advised repeat 09/2012   ? Gastroesophageal reflux disease 06/14/2012  ? GERD (gastroesophageal reflux disease)   ? Hearing loss 09/25/2021  ? Hip impingement syndrome, left 09/19/2021  ? History of blood transfusion   ? History of cervical dysplasia 06/15/2012  ? History of gastric polyp 06/25/2013  ? History of shingles 02/14/2016  ? Hyperlipidemia   ? Hypertension   ? Insomnia 05/18/2013  ? Intervertebral disc protrusion 08/22/2013  ? See MRI  03/2013   ? Left rotator cuff tear 08/17/2014  ? See MRI 2015   ? Leg swelling 08/03/2013  ? Venous dopplers neg 08/04/13    ? Lumbar spondylosis 07/03/2013  ? Meniere disease, right 03/06/2021  ? 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 Menopause 06/15/2012  ? Mixed dyslipidemia 09/18/2020  ? Neuropathy 09/25/2021  ? Obesity   ? Obesity (BMI 30.0-34.9) 01/02/2021  ? OSA (obstructive sleep apnea)   ? Osteoarthritis of knee 06/15/2012   ? Formatting of this note might be different from the original. Last Assessment & Plan:  On chronic Celebrex, refill provided  ? Other and unspecified hyperlipidemia 06/14/2012  ? Other long term (current) drug therapy 09/25/2021  ? Other specified abnormal immunological findings in serum 09/25/2021  ? Pain in limb 09/25/2021  ? Personal history of colonic polyps 01/18/2013  ? Personal history of renal calculi 02/23/2013  ? nonobstructing stone L kidney   Formatting of this note might be different from the original. Overview:  nonobstructing stone L kidney  ? Posttraumatic stress disorder 05/16/2015  ? 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 Formatting of this note might be different from the original. Last Assessment & Plan:  Improvement with the glenohumeral injection. -Counseled on home exercise therapy and supportive care. -Could consider physical therapy.  ? Pulmonary embolism ([place]) 08/1975  ? Pulmonary hypertension ([place]) 08/03/2013  ? H/o PE VQ, duplex neg 08/2013 - Echo 08/15/2013 >>PA peak pressure: 56m Hg   Formatting of this note might be different from the original. Overview:  H/o PE VQ, duplex neg 08/2013 - Echo 08/15/2013 >>PA peak  pressure: 70m Hg  Last Assessment & Plan:  Rpt echo in 1 yr  ? [place] 04/09[place]  ? Right wrist pain 02/22/2020  ? S/P hysterectomy 06/14/2012  ? Pap 10/2011 negative  Formatting of this note might be different from the original. Overview:  Pap 10/2011 negative  ? 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 [place]al depression ([place] 06/15/2012  ? Per pt.  On Prozac in past  Formatting of this note might be different from the original. Overview:  Per pt.  On Prozac in past  Last Assessment & Plan:  Indicates getting some meds from psychologist and some from primary Polypharmacy contributing to risk of syncope  Would simplify  On zoloft now  ? Sensorineural hearing loss (SNHL) of both ears 03/06/2021  ? Sickle cell anemia (H[facility])   ? Sjogren  syndrome, unspecified ([facility] 09/25/2021  ? Sleep apnea   ? does have a cpap  ? Subacromial impingement, right 02/22/2020  ? Syncope 12/12/2014  ? Torn rotator cuff 07/2016  ? left  ? Urinary frequency 01

## 2022-02-21 DIAGNOSIS — M25852 Other specified joint disorders, left hip: Secondary | ICD-10-CM | POA: Diagnosis not present

## 2022-02-21 DIAGNOSIS — M5412 Radiculopathy, cervical region: Secondary | ICD-10-CM | POA: Diagnosis not present

## 2022-02-21 LAB — ANTI-NUCLEAR AB-TITER (ANA TITER): ANA Titer 1: 1:320 {titer} — ABNORMAL HIGH

## 2022-02-21 LAB — ANA: Anti Nuclear Antibody (ANA): POSITIVE — AB

## 2022-02-21 LAB — RHEUMATOID FACTOR: Rheumatoid fact SerPl-aCnc: 16 IU/mL — ABNORMAL HIGH (ref <14)

## 2022-02-21 MED ORDER — FLUCONAZOLE 150 MG PO TABS: 150.0000 mg | ORAL_TABLET | Freq: Every day | ORAL | 0 refills | Status: DC

## 2022-02-21 MED ORDER — CIPROFLOXACIN HCL 500 MG PO TABS: 500.0000 mg | ORAL_TABLET | Freq: Two times a day (BID) | ORAL | 0 refills | Status: DC

## 2022-02-21 NOTE — Addendum Note (Signed)
Addended by: Anabel Halon on: 02/21/2022 05:07 PM ? ? Modules accepted: Orders ? ?

## 2022-02-22 LAB — URINE CULTURE
MICRO NUMBER:: 13134177
SPECIMEN QUALITY:: ADEQUATE

## 2022-02-27 ENCOUNTER — Ambulatory Visit: Payer: Medicare HMO | Admitting: Psychology

## 2022-03-03 DIAGNOSIS — M25852 Other specified joint disorders, left hip: Secondary | ICD-10-CM | POA: Diagnosis not present

## 2022-03-03 DIAGNOSIS — M5412 Radiculopathy, cervical region: Secondary | ICD-10-CM | POA: Diagnosis not present

## 2022-03-11 ENCOUNTER — Ambulatory Visit (INDEPENDENT_AMBULATORY_CARE_PROVIDER_SITE_OTHER): Payer: Medicare HMO | Admitting: Psychology

## 2022-03-11 DIAGNOSIS — R69 Illness, unspecified: Secondary | ICD-10-CM | POA: Diagnosis not present

## 2022-03-11 DIAGNOSIS — F4321 Adjustment disorder with depressed mood: Secondary | ICD-10-CM

## 2022-03-11 NOTE — Progress Notes (Signed)
[facility] Counselor/Therapist Progress Note ? ?Patient ID: Judy [name], MRN: [id number],   ? ?Date: 03/11/2022 ? ?Time Spent: 11:00am - 11:55am   55 minutes  ? ?Treatment Type: Individual Therapy ? ?Reported Symptoms: decrease energy ? ?Mental Status Exam: ?Appearance:  Casual and Neat     ?Behavior: Appropriate  ?Motor: Normal  ?Speech/Language:  Normal Rate  ?Affect: Appropriate  ?Mood: normal  ?Thought process: normal  ?Thought content:   WNL  ?Sensory/Perceptual disturbances:   WNL  ?Orientation: oriented to person, place, time/date, and situation  ?Attention: Good  ?Concentration: Good  ?Memory: WNL  ?Fund of knowledge:  Good  ?Insight:   Good  ?Judgment:  Good  ?Impulse Control: Good  ? ?Risk Assessment: ?Danger to Self:  No ?Self-injurious Behavior: No ?Danger to Others: No ?Duty to Warn:no ?Physical Aggression / Violence:No  ?Access to Firearms a concern: No  ?Gang Involvement:No  ? ?Subjective: Pt present for face-to-face individual therapy via video Webex.  Pt consents to telehealth video session due to COVID 19 pandemic. ?Location of pt: home ?Location of therapist: home office.  ?Pt talked about having a meeting this morning with [employer].  She is very involved in keeping the organization going. ?Pt talked about her health.  She still has a lot of fatigue.    She is working on getting things organized but feels she needs help with that.   ?Pt talked about her work.  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   It can be stressful work.  Addressed the stressors and worked on Child psychotherapist.   ?Provided supportive therapy.   ? ?Interventions: Cognitive Behavioral Therapy and Insight-Oriented ? 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 ?Plan: Plan to meet every two weeks.   Pt is progressing toward treatment goals.   ? ?Treatment Plan  (Treatment Plan Target Date: 02/13/2023) ?Client Abilities/Strengths  ?Pt is bright, engaging, and motivated for therapy.   ?Client Treatment Preferences   ?Individual therapy.  ?Client Statement of Needs  ?Improve coping skills.  ?Symptoms  ?Depressed or irritable mood. ?Lack of energy. ?Social withdrawal. ?Unresolved grief issues. ? ? ?Problems Addressed  ?Unipolar Depression ?Goals ?1. Alleviate depressive symptoms and return to previous level of effective functioning. ?2. Appropriately grieve the loss in order to normalize mood and to return to previously adaptive level of functioning. ?Objective ?Learn and implement behavioral strategies to overcome depression. ?Target Date: 2023-02-13 Frequency: Biweekly  ?Progress: 20 Modality: individual  ?Related Interventions ?Engage the client in "behavioral activation," increasing his/her activity level and contact with sources of reward, while identifying processes that inhibit activation.  Use behavioral techniques such as instruction, rehearsal, role-playing, role reversal, as needed, to facilitate activity in the client's daily life; reinforce success. ?Assist the client in developing skills that increase the likelihood of deriving pleasure from behavioral activation (e.g., assertiveness skills, developing an exercise plan, less internal/more external focus, increased social involvement); reinforce success. ?Objective ?Identify important people in life, past and present, and describe the quality, good and poor, of those relationships. ?Target Date: 2023-02-13 Frequency: Biweekly  ?Progress: 20 Modality: individual  ?Related Interventions ?Conduct Interpersonal Therapy beginning with the assessment of the client's "interpersonal inventory" of important past and present relationships; develop a case formulation linking depression to grief, interpersonal role disputes, role transitions, and/or interpersonal deficits). ?Objective ?Learn and implement problem-solving and decision-making skills. ?Target Date: 2023-02-13 Frequency: Biweekly  ?Progress: 20 Modality: individual  ?Related Interventions ?Conduct Problem-Solving  Therapy using techniques such as psychoeducation, modeling, and role-playing  to teach client problem-solving skills (i.e., defining a problem specifically, generating possible solutions, evaluating the pros and cons of each solution, selecting and implementing a plan of action, evaluating the efficacy of the plan, accepting or revising the plan); role-play application of the problem-solving skill to a real life issue. ?Encourage in the client the development of a positive problem orientation in which problems and solving them are viewed as a natural part of life and not something to be feared, despaired, or avoided. ?3. Develop healthy interpersonal relationships that lead to the alleviation and help prevent the relapse of depression. ?4. Develop healthy thinking patterns and beliefs about self, others, and the world that lead to the alleviation and help prevent the relapse of depression. ?5. Recognize, accept, and cope with feelings of depression. ?Diagnosis ?F43.21 ?Conditions For Discharge ?Achievement of treatment goals and objectives  ? ?Lajean Boese, LCSW ? ? ? ?

## 2022-03-24 DIAGNOSIS — M25852 Other specified joint disorders, left hip: Secondary | ICD-10-CM | POA: Diagnosis not present

## 2022-03-24 DIAGNOSIS — M5412 Radiculopathy, cervical region: Secondary | ICD-10-CM | POA: Diagnosis not present

## 2022-03-27 DIAGNOSIS — Z79899 Other long term (current) drug therapy: Secondary | ICD-10-CM | POA: Diagnosis not present

## 2022-03-27 DIAGNOSIS — R5383 Other fatigue: Secondary | ICD-10-CM | POA: Diagnosis not present

## 2022-03-27 DIAGNOSIS — M0579 Rheumatoid arthritis with rheumatoid factor of multiple sites without organ or systems involvement: Secondary | ICD-10-CM | POA: Diagnosis not present

## 2022-04-09 ENCOUNTER — Ambulatory Visit (INDEPENDENT_AMBULATORY_CARE_PROVIDER_SITE_OTHER): Payer: Medicare HMO | Admitting: Psychology

## 2022-04-09 DIAGNOSIS — R69 Illness, unspecified: Secondary | ICD-10-CM | POA: Diagnosis not present

## 2022-04-09 DIAGNOSIS — F4321 Adjustment disorder with depressed mood: Secondary | ICD-10-CM

## 2022-04-09 NOTE — Progress Notes (Signed)
[facility] Counselor/Therapist Progress Note ? ?Patient ID: Judy [name], MRN: [id number],   ? ?Date: 04/09/2022 ? ?Time Spent: 4:00pm - 4:55pm   55 minutes  ? ?Treatment Type: Individual Therapy ? ?Reported Symptoms: stress ? ?Mental Status Exam: ?Appearance:  Casual and Neat     ?Behavior: Appropriate  ?Motor: Normal  ?Speech/Language:  Normal Rate  ?Affect: Appropriate  ?Mood: normal  ?Thought process: normal  ?Thought content:   WNL  ?Sensory/Perceptual disturbances:   WNL  ?Orientation: oriented to person, place, time/date, and situation  ?Attention: Good  ?Concentration: Good  ?Memory: WNL  ?Fund of knowledge:  Good  ?Insight:   Good  ?Judgment:  Good  ?Impulse Control: Good  ? ?Risk Assessment: ?Danger to Self:  No ?Self-injurious Behavior: No ?Danger to Others: No ?Duty to Warn:no ?Physical Aggression / Violence:No  ?Access to Firearms a concern: No  ?Gang Involvement:No  ? ?Subjective:  ?Pt present for face-to-face individual therapy via video Webex.  Pt consents to telehealth video session due to COVID 19 pandemic. ?Location of pt: home ?Location of therapist: home office.  ?Pt states that she changed her Wellbutrin to '300mg'$ .  The increase has helped her.   Pt has also had some medication adjustment for her rheumatologist.  Pt is experiencing more energy which she is feeling positive about.  Pt feels like she is getting some things done.  She still does not feel like she is at her optimal energy but it has improved.   Pt's mood has also improved.   ?Pt is working on putting together a Customer service manager.  She is trying to get it out to [employer] and [employer].  Pt is also working on getting [employer] up.  ?Pt wants to start getting back into her physical activity routine.  [facility]  She finds the water enjoyable and relaxing.   ?Pt talked about her health.  She wants to get back to a plan based diet to decrease her cholesterol.  She has had some  flares in her rheumatoid arthritis.  Addressed pt's health concerns.    ?Addressed how pt can increase self care.  ?Provided supportive therapy.   ? ?Interventions: Cognitive Behavioral Therapy and Insight-Oriented ? 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 ?Plan: Plan to meet every two weeks.   Pt is progressing toward treatment goals.   ? ?Treatment Plan  (Treatment Plan Target Date: 02/13/2023) ?Client Abilities/Strengths  ?Pt is bright, engaging, and motivated for therapy.   ?Client Treatment Preferences  ?Individual therapy.  ?Client Statement of Needs  ?Improve coping skills.  ?Symptoms  ?Depressed or irritable mood. ?Lack of energy. ?Social withdrawal. ?Unresolved grief issues. ? ? ?Problems Addressed  ?Unipolar Depression ?Goals ?1. Alleviate depressive symptoms and return to previous level of effective functioning. ?2. Appropriately grieve the loss in order to normalize mood and to return to previously adaptive level of functioning. ?Objective ?Learn and implement behavioral strategies to overcome depression. ?Target Date: 2023-02-13 Frequency: Biweekly  ?Progress: 20 Modality: individual  ?Related Interventions ?Engage the client in "behavioral activation," increasing his/her activity level and contact with sources of reward, while identifying processes that inhibit activation.  Use behavioral techniques such as instruction, rehearsal, role-playing, role reversal, as needed, to facilitate activity in the client's daily life; reinforce success. ?Assist the client in developing skills that increase the likelihood of deriving pleasure from behavioral activation (e.g., assertiveness skills, developing an exercise plan, less internal/more external focus, increased social involvement);  reinforce success. ?Objective ?Identify important people in life, past and present, and describe the quality, good and poor, of those relationships. ?Target Date: 2023-02-13 Frequency: Biweekly  ?Progress: 20 Modality: individual  ?Related  Interventions ?Conduct Interpersonal Therapy beginning with the assessment of the client's "interpersonal inventory" of important past and present relationships; develop a case formulation linking depression to grief, interpersonal role disputes, role transitions, and/or interpersonal deficits). ?Objective ?Learn and implement problem-solving and decision-making skills. ?Target Date: 2023-02-13 Frequency: Biweekly  ?Progress: 20 Modality: individual  ?Related Interventions ?Conduct Problem-Solving Therapy using techniques such as psychoeducation, modeling, and role-playing to teach client problem-solving skills (i.e., defining a problem specifically, generating possible solutions, evaluating the pros and cons of each solution, selecting and implementing a plan of action, evaluating the efficacy of the plan, accepting or revising the plan); role-play application of the problem-solving skill to a real life issue. ?Encourage in the client the development of a positive problem orientation in which problems and solving them are viewed as a natural part of life and not something to be feared, despaired, or avoided. ?3. Develop healthy interpersonal relationships that lead to the alleviation and help prevent the relapse of depression. ?4. Develop healthy thinking patterns and beliefs about self, others, and the world that lead to the alleviation and help prevent the relapse of depression. ?5. Recognize, accept, and cope with feelings of depression. ?Diagnosis ?F43.21 ?Conditions For Discharge ?Achievement of treatment goals and objectives  ? ?Israel Wunder, LCSW ? ? ? ?

## 2022-04-18 ENCOUNTER — Telehealth: Payer: Self-pay | Admitting: Medical

## 2022-04-18 ENCOUNTER — Other Ambulatory Visit: Payer: Self-pay | Admitting: Medical

## 2022-04-18 DIAGNOSIS — I10 Essential (primary) hypertension: Secondary | ICD-10-CM

## 2022-04-18 NOTE — Telephone Encounter (Signed)
Patient dropped off form to be filled out ? ?Placed in [place] bin up front ? ?Patient would like the form faxed to [phone] after completing  ?

## 2022-04-18 NOTE — Telephone Encounter (Signed)
Forms placed in provider's red folder ?

## 2022-04-23 DIAGNOSIS — M25852 Other specified joint disorders, left hip: Secondary | ICD-10-CM | POA: Diagnosis not present

## 2022-04-23 DIAGNOSIS — M5412 Radiculopathy, cervical region: Secondary | ICD-10-CM | POA: Diagnosis not present

## 2022-04-24 ENCOUNTER — Encounter: Payer: Self-pay | Admitting: Medical

## 2022-04-24 ENCOUNTER — Telehealth: Payer: Self-pay | Admitting: Medical

## 2022-04-24 NOTE — Telephone Encounter (Signed)
I have form for Judy [name] states readiness/lack of contagion. More than 2 months since last seen. What is the need to fill this for out. She had cough in march. Is that cough still present and thus [facility] if she is infectious. If this is reason then would probably want to get tb gold blood test and chest xray  before signing offas they want me to state no health condition not infectious.

## 2022-04-25 NOTE — Telephone Encounter (Signed)
Patient states she starts work next week , and needs form prior working

## 2022-04-25 NOTE — Telephone Encounter (Signed)
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

## 2022-04-25 NOTE — Telephone Encounter (Signed)
No I dont have her paperwork

## 2022-04-25 NOTE — Addendum Note (Signed)
Addended by: Anabel Halon on: 04/25/2022 12:48 PM   Modules accepted: Orders

## 2022-04-25 NOTE — Addendum Note (Signed)
Addended by: Anabel Halon on: 04/25/2022 01:08 PM   Modules accepted: Orders

## 2022-04-28 ENCOUNTER — Ambulatory Visit (INDEPENDENT_AMBULATORY_CARE_PROVIDER_SITE_OTHER): Payer: Medicare HMO | Admitting: Medical

## 2022-04-28 VITALS — BP 136/70 | HR 75 | Temp 98.3°F | Resp 18 | Ht 68.0 in | Wt 213.0 lb

## 2022-04-28 DIAGNOSIS — R7303 Prediabetes: Secondary | ICD-10-CM

## 2022-04-28 DIAGNOSIS — R350 Frequency of micturition: Secondary | ICD-10-CM | POA: Diagnosis not present

## 2022-04-28 DIAGNOSIS — Z111 Encounter for screening for respiratory tuberculosis: Secondary | ICD-10-CM

## 2022-04-28 DIAGNOSIS — R1013 Epigastric pain: Secondary | ICD-10-CM

## 2022-04-28 DIAGNOSIS — K219 Gastro-esophageal reflux disease without esophagitis: Secondary | ICD-10-CM | POA: Diagnosis not present

## 2022-04-28 DIAGNOSIS — J301 Allergic rhinitis due to pollen: Secondary | ICD-10-CM | POA: Diagnosis not present

## 2022-04-28 DIAGNOSIS — R739 Hyperglycemia, unspecified: Secondary | ICD-10-CM | POA: Diagnosis not present

## 2022-04-28 LAB — POC URINALSYSI DIPSTICK (AUTOMATED)
Bilirubin, UA: NEGATIVE
Glucose, UA: NEGATIVE
Ketones, UA: NEGATIVE
Leukocytes, UA: NEGATIVE
Nitrite, UA: NEGATIVE
Protein, UA: NEGATIVE
Spec Grav, UA: 1.02 (ref 1.010–1.025)
Urobilinogen, UA: 0.2 E.U./dL
pH, UA: 6 (ref 5.0–8.0)

## 2022-04-28 MED ORDER — FAMOTIDINE 20 MG PO TABS: 20.0000 mg | ORAL_TABLET | Freq: Every day | ORAL | 0 refills | Status: DC

## 2022-04-28 NOTE — Patient Instructions (Addendum)
Gerd with some epigastric tenderness on exam. CBC, CMP and lipase today. Recommend stop pepto and start famotadine.  Frequent urination. Get poct ua and urine culture. 2 months ago had uti so will make sure not the case.  Elevated sugar. Will get a1c. Then decide on referral to endocrinologist as you requested.   Tb gold screening. If negative then fill out work form.   PND on exam with some cough. Continue otc claritin.  Follow up date 4 weeks or sooner if needed.
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
## 2022-04-29 LAB — COMPREHENSIVE METABOLIC PANEL
ALT: 12 U/L (ref 0–35)
AST: 17 U/L (ref 0–37)
Albumin: 4.2 g/dL (ref 3.5–5.2)
Alkaline Phosphatase: 66 U/L (ref 39–117)
BUN: 17 mg/dL (ref 6–23)
CO2: 29 mEq/L (ref 19–32)
Calcium: 9.4 mg/dL (ref 8.4–10.5)
Chloride: 103 mEq/L (ref 96–112)
Creatinine, Ser: 0.96 mg/dL (ref 0.40–1.20)
GFR: 60.71 mL/min (ref >60.00)
Glucose, Bld: 93 mg/dL (ref 70–99)
Potassium: 4.2 mEq/L (ref 3.5–5.1)
Sodium: 139 mEq/L (ref 135–145)
Total Bilirubin: 0.6 mg/dL (ref 0.2–1.2)
Total Protein: 7.1 g/dL (ref 6.0–8.3)

## 2022-04-29 LAB — URINE CULTURE
MICRO NUMBER:: 13427410
SPECIMEN QUALITY:: ADEQUATE

## 2022-04-29 LAB — CBC WITH DIFFERENTIAL/PLATELET
Basophils Absolute: 0.1 10*3/uL (ref 0.0–0.1)
Basophils Relative: 1.2 % (ref 0.0–3.0)
Eosinophils Absolute: 0.1 10*3/uL (ref 0.0–0.7)
Eosinophils Relative: 0.7 % (ref 0.0–5.0)
HCT: 36.2 % (ref 36.0–46.0)
Hemoglobin: 11.7 g/dL — ABNORMAL LOW (ref 12.0–15.0)
Lymphocytes Relative: 21 % (ref 12.0–46.0)
Lymphs Abs: 1.6 10*3/uL (ref 0.7–4.0)
MCHC: 32.4 g/dL (ref 30.0–36.0)
MCV: 87.5 fl (ref 78.0–100.0)
Monocytes Absolute: 0.4 10*3/uL (ref 0.1–1.0)
Monocytes Relative: 5.3 % (ref 3.0–12.0)
Neutro Abs: 5.4 10*3/uL (ref 1.4–7.7)
Neutrophils Relative %: 71.8 % (ref 43.0–77.0)
Platelets: 322 10*3/uL (ref 150.0–400.0)
RBC: 4.13 Mil/uL (ref 3.87–5.11)
RDW: 15.2 % (ref 11.5–15.5)
WBC: 7.5 10*3/uL (ref 4.0–10.5)

## 2022-04-29 LAB — HEMOGLOBIN A1C: Hgb A1c MFr Bld: 5.9 % (ref 4.6–6.5)

## 2022-04-29 LAB — LIPASE: Lipase: 6 U/L — ABNORMAL LOW (ref 11.0–59.0)

## 2022-04-29 NOTE — Addendum Note (Signed)
Addended by: Anabel Halon on: 04/29/2022 07:58 PM   Modules accepted: Orders

## 2022-05-02 LAB — QUANTIFERON-TB GOLD PLUS
Mitogen-NIL: >10 IU/mL
NIL: 0.04 IU/mL
QuantiFERON-TB Gold Plus: NEGATIVE
TB1-NIL: <0 IU/mL
TB2-NIL: 0.01 IU/mL

## 2022-05-12 ENCOUNTER — Other Ambulatory Visit: Payer: Self-pay | Admitting: Medical

## 2022-05-12 DIAGNOSIS — K219 Gastro-esophageal reflux disease without esophagitis: Secondary | ICD-10-CM

## 2022-05-13 ENCOUNTER — Ambulatory Visit (INDEPENDENT_AMBULATORY_CARE_PROVIDER_SITE_OTHER): Payer: Medicare HMO | Admitting: Family Medicine

## 2022-05-13 ENCOUNTER — Ambulatory Visit (HOSPITAL_BASED_OUTPATIENT_CLINIC_OR_DEPARTMENT_OTHER)
Admission: RE | Admit: 2022-05-13 | Discharge: 2022-05-13 | Disposition: A | Discharge status: Home / Self Care | Payer: Medicare HMO | Source: Ambulatory Visit | Attending: Family Medicine | Admitting: Family Medicine

## 2022-05-13 ENCOUNTER — Encounter: Payer: Self-pay | Admitting: Family Medicine

## 2022-05-13 VITALS — BP 122/80 | Ht 68.0 in | Wt 210.0 lb

## 2022-05-13 DIAGNOSIS — M1611 Unilateral primary osteoarthritis, right hip: Secondary | ICD-10-CM | POA: Insufficient documentation

## 2022-05-13 DIAGNOSIS — M25551 Pain in right hip: Secondary | ICD-10-CM | POA: Diagnosis not present

## 2022-05-13 HISTORY — DX: Unilateral primary osteoarthritis, right hip: M16.11

## 2022-05-13 MED ORDER — CELECOXIB 200 MG PO CAPS: 200.0000 mg | ORAL_CAPSULE | Freq: Two times a day (BID) | ORAL | 3 refills | Status: DC
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
## 2022-05-13 NOTE — Assessment & Plan Note (Signed)
Acute on chronic in nature.  Symptoms concerning for involving the joint.  She has risk factors with her sickle cell trait as well as long steroid use. -Counseled on home exercise therapy and supportive care. -Refilled Celebrex. -X-ray of the hips.

## 2022-05-14 DIAGNOSIS — Z79631 Long term (current) use of antimetabolite agent: Secondary | ICD-10-CM | POA: Diagnosis not present

## 2022-05-14 DIAGNOSIS — I10 Essential (primary) hypertension: Secondary | ICD-10-CM | POA: Diagnosis not present

## 2022-05-14 DIAGNOSIS — Z79620 Long term (current) use of immunosuppressive biologic: Secondary | ICD-10-CM | POA: Diagnosis not present

## 2022-05-14 DIAGNOSIS — D84821 Immunodeficiency due to drugs: Secondary | ICD-10-CM | POA: Diagnosis not present

## 2022-05-14 DIAGNOSIS — Z6831 Body mass index (BMI) 31.0-31.9, adult: Secondary | ICD-10-CM | POA: Diagnosis not present

## 2022-05-14 DIAGNOSIS — M1990 Unspecified osteoarthritis, unspecified site: Secondary | ICD-10-CM | POA: Diagnosis not present

## 2022-05-14 DIAGNOSIS — Z791 Long term (current) use of non-steroidal anti-inflammatories (NSAID): Secondary | ICD-10-CM | POA: Diagnosis not present

## 2022-05-14 DIAGNOSIS — R69 Illness, unspecified: Secondary | ICD-10-CM | POA: Diagnosis not present

## 2022-05-14 DIAGNOSIS — K219 Gastro-esophageal reflux disease without esophagitis: Secondary | ICD-10-CM | POA: Diagnosis not present

## 2022-05-14 DIAGNOSIS — Z008 Encounter for other general examination: Secondary | ICD-10-CM | POA: Diagnosis not present

## 2022-05-14 DIAGNOSIS — M0550 Rheumatoid polyneuropathy with rheumatoid arthritis of unspecified site: Secondary | ICD-10-CM | POA: Diagnosis not present

## 2022-05-14 DIAGNOSIS — Z7952 Long term (current) use of systemic steroids: Secondary | ICD-10-CM | POA: Diagnosis not present

## 2022-05-14 DIAGNOSIS — E669 Obesity, unspecified: Secondary | ICD-10-CM | POA: Diagnosis not present

## 2022-05-16 ENCOUNTER — Telehealth: Payer: Self-pay | Admitting: Family Medicine

## 2022-05-16 DIAGNOSIS — M1611 Unilateral primary osteoarthritis, right hip: Secondary | ICD-10-CM

## 2022-05-16 NOTE — Telephone Encounter (Signed)
Informed of her right hip x-ray results.  She continues to have worsening right hip pain.  Concern for avascular necrosis given her long history of steroid use as well as having sickle cell trait.  We will pursue the MRI of the right hip to evaluate for avascular necrosis and for presurgical planning.  Rosemarie Ax, MD [facility] 05/16/2022, 2:09 PM

## 2022-05-21 DIAGNOSIS — Z6832 Body mass index (BMI) 32.0-32.9, adult: Secondary | ICD-10-CM | POA: Diagnosis not present

## 2022-05-21 DIAGNOSIS — M25551 Pain in right hip: Secondary | ICD-10-CM | POA: Diagnosis not present

## 2022-05-21 DIAGNOSIS — Z79899 Other long term (current) drug therapy: Secondary | ICD-10-CM | POA: Diagnosis not present

## 2022-05-21 DIAGNOSIS — E669 Obesity, unspecified: Secondary | ICD-10-CM | POA: Diagnosis not present

## 2022-05-21 DIAGNOSIS — M3500 Sicca syndrome, unspecified: Secondary | ICD-10-CM | POA: Diagnosis not present

## 2022-05-21 DIAGNOSIS — M0579 Rheumatoid arthritis with rheumatoid factor of multiple sites without organ or systems involvement: Secondary | ICD-10-CM | POA: Diagnosis not present

## 2022-05-21 DIAGNOSIS — M5030 Other cervical disc degeneration, unspecified cervical region: Secondary | ICD-10-CM | POA: Diagnosis not present

## 2022-05-21 DIAGNOSIS — R768 Other specified abnormal immunological findings in serum: Secondary | ICD-10-CM | POA: Diagnosis not present

## 2022-05-21 DIAGNOSIS — G629 Polyneuropathy, unspecified: Secondary | ICD-10-CM | POA: Diagnosis not present

## 2022-05-21 DIAGNOSIS — H9191 Unspecified hearing loss, right ear: Secondary | ICD-10-CM | POA: Diagnosis not present

## 2022-05-21 DIAGNOSIS — H209 Unspecified iridocyclitis: Secondary | ICD-10-CM | POA: Diagnosis not present

## 2022-05-22 DIAGNOSIS — Z79899 Other long term (current) drug therapy: Secondary | ICD-10-CM | POA: Diagnosis not present

## 2022-05-22 DIAGNOSIS — M0579 Rheumatoid arthritis with rheumatoid factor of multiple sites without organ or systems involvement: Secondary | ICD-10-CM | POA: Diagnosis not present

## 2022-05-24 DIAGNOSIS — M5412 Radiculopathy, cervical region: Secondary | ICD-10-CM | POA: Diagnosis not present

## 2022-05-24 DIAGNOSIS — M25852 Other specified joint disorders, left hip: Secondary | ICD-10-CM | POA: Diagnosis not present

## 2022-05-27 ENCOUNTER — Other Ambulatory Visit: Payer: Self-pay | Admitting: Medical

## 2022-05-27 ENCOUNTER — Ambulatory Visit (INDEPENDENT_AMBULATORY_CARE_PROVIDER_SITE_OTHER): Payer: Medicare HMO

## 2022-05-27 DIAGNOSIS — M533 Sacrococcygeal disorders, not elsewhere classified: Secondary | ICD-10-CM | POA: Diagnosis not present

## 2022-05-27 DIAGNOSIS — M1611 Unilateral primary osteoarthritis, right hip: Secondary | ICD-10-CM

## 2022-05-29 ENCOUNTER — Ambulatory Visit: Payer: Medicare HMO | Admitting: Psychology

## 2022-05-29 DIAGNOSIS — H8101 Meniere's disease, right ear: Secondary | ICD-10-CM | POA: Diagnosis not present

## 2022-05-29 DIAGNOSIS — H918X9 Other specified hearing loss, unspecified ear: Secondary | ICD-10-CM | POA: Diagnosis not present

## 2022-05-29 DIAGNOSIS — H903 Sensorineural hearing loss, bilateral: Secondary | ICD-10-CM | POA: Diagnosis not present

## 2022-05-30 ENCOUNTER — Encounter: Payer: Self-pay | Admitting: Family Medicine

## 2022-05-30 ENCOUNTER — Telehealth (INDEPENDENT_AMBULATORY_CARE_PROVIDER_SITE_OTHER): Payer: Medicare HMO | Admitting: Family Medicine

## 2022-05-30 DIAGNOSIS — M1611 Unilateral primary osteoarthritis, right hip: Secondary | ICD-10-CM

## 2022-05-30 NOTE — Assessment & Plan Note (Signed)
Acutely occurring.  She has been doing home exercises and therapy with limited improvement.  She will still exacerbate her pain with limited activity. -Counseled on home exercise therapy and supportive care. -Could pursue hip joint injection.

## 2022-06-02 ENCOUNTER — Ambulatory Visit: Payer: Medicare HMO | Admitting: Medical

## 2022-06-11 ENCOUNTER — Ambulatory Visit (INDEPENDENT_AMBULATORY_CARE_PROVIDER_SITE_OTHER): Payer: Medicare HMO | Admitting: "Endocrinology

## 2022-06-11 ENCOUNTER — Encounter: Payer: Self-pay | Admitting: "Endocrinology

## 2022-06-11 VITALS — BP 136/64 | HR 68 | Ht 68.0 in | Wt 216.4 lb

## 2022-06-11 DIAGNOSIS — I10 Essential (primary) hypertension: Secondary | ICD-10-CM

## 2022-06-11 DIAGNOSIS — E782 Mixed hyperlipidemia: Secondary | ICD-10-CM

## 2022-06-11 DIAGNOSIS — R7303 Prediabetes: Secondary | ICD-10-CM

## 2022-06-11 DIAGNOSIS — E6609 Other obesity due to excess calories: Secondary | ICD-10-CM

## 2022-06-11 DIAGNOSIS — Z6832 Body mass index (BMI) 32.0-32.9, adult: Secondary | ICD-10-CM | POA: Diagnosis not present

## 2022-06-11 HISTORY — DX: Prediabetes: R73.03

## 2022-06-11 NOTE — Patient Instructions (Signed)
                                     Advice for Weight Management  -For most of us the best way to lose weight is by diet management. Generally speaking, diet management means consuming less calories intentionally which over time brings about progressive weight loss.  This can be achieved more effectively by avoiding ultra processed carbohydrates, processed meats, unhealthy fats.    It is critically important to know your numbers: how much calorie you are consuming and how much calorie you need. More importantly, our carbohydrates sources should be unprocessed naturally occurring  complex starch food items.  It is always important to balance nutrition also by  appropriate intake of proteins (mainly plant-based), healthy fats/oils, plenty of fruits and vegetables.   -The American College of Lifestyle Medicine (ACL M) recommends nutrition derived mostly from Whole Food, Plant Predominant Sources example an apple instead of applesauce or apple pie. Eat Plenty of vegetables, Mushrooms, fruits, Legumes, Whole Grains, Nuts, seeds in lieu of processed meats, processed snacks/pastries red meat, poultry, eggs.  Use only water or unsweetened tea for hydration.  The College also recommends the need to stay away from risky substances including alcohol, smoking; obtaining 7-9 hours of restorative sleep, at least 150 minutes of moderate intensity exercise weekly, importance of healthy social connections, and being mindful of stress and seek help when it is overwhelming.    -Sticking to a routine mealtime to eat 3 meals a day and avoiding unnecessary snacks is shown to have a big role in weight control. Under normal circumstances, the only time we burn stored energy is when we are hungry, so allow  some hunger to take place- hunger means no food between appropriate meal times, only water.  It is not advisable to starve.   -It is better to avoid simple carbohydrates including:  Cakes, Sweet Desserts, Ice Cream, Soda (diet and regular), Sweet Tea, Candies, Chips, Cookies, Store Bought Juices, Alcohol in Excess of  1-2 drinks a day, Lemonade,  Artificial Sweeteners, Doughnuts, Coffee Creamers, "Sugar-free" Products, etc, etc.  This is not a complete list.....    -Consulting with certified diabetes educators is proven to provide you with the most accurate and current information on diet.  Also, you may be  interested in discussing diet options/exchanges , we can schedule a visit with Judy [name], RDN, CDE for individualized nutrition education.  -Exercise: If you are able: 30 -60 minutes a day ,4 days a week, or 150 minutes of moderate intensity exercise weekly.    The longer the better if tolerated.  Combine stretch, strength, and aerobic activities.  If you were told in the past that you have high risk for cardiovascular diseases, or if you are currently symptomatic, you may seek evaluation by your heart doctor prior to initiating moderate to intense exercise programs.                                  Additional Care Considerations for Diabetes/Prediabetes   -Diabetes  is a chronic disease.  The most important care consideration is regular follow-up with your diabetes care provider with the goal being avoiding or delaying its complications and to take advantage of advances in medications and technology.  If appropriate actions are taken early enough, type 2 diabetes can even be   reversed.  Seek information from the right source.  - Whole Food, Plant Predominant Nutrition is highly recommended: Eat Plenty of vegetables, Mushrooms, fruits, Legumes, Whole Grains, Nuts, seeds in lieu of processed meats, processed snacks/pastries red meat, poultry, eggs as recommended by American College of  Lifestyle Medicine (ACLM).  -Type 2 diabetes is known to coexist with other important comorbidities such as high blood pressure and high cholesterol.  It is critical to control not only the  diabetes but also the high blood pressure and high cholesterol to minimize and delay the risk of complications including coronary artery disease, stroke, amputations, blindness, etc.  The good news is that this diet recommendation for type 2 diabetes is also very helpful for managing high cholesterol and high blood blood pressure.  - Studies showed that people with diabetes will benefit from a class of medications known as ACE inhibitors and statins.  Unless there are specific reasons not to be on these medications, the standard of care is to consider getting one from these groups of medications at an optimal doses.  These medications are generally considered safe and proven to help protect the heart and the kidneys.    - People with diabetes are encouraged to initiate and maintain regular follow-up with eye doctors, foot doctors, dentists , and if necessary heart and kidney doctors.     - It is highly recommended that people with diabetes quit smoking or stay away from smoking, and get yearly  flu vaccine and pneumonia vaccine at least every 5 years.  See above for additional recommendations on exercise, sleep, stress management , and healthy social connections.      
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
## 2022-06-12 DIAGNOSIS — I6782 Cerebral ischemia: Secondary | ICD-10-CM | POA: Diagnosis not present

## 2022-06-12 DIAGNOSIS — H9190 Unspecified hearing loss, unspecified ear: Secondary | ICD-10-CM | POA: Diagnosis not present

## 2022-06-12 DIAGNOSIS — H918X1 Other specified hearing loss, right ear: Secondary | ICD-10-CM | POA: Diagnosis not present

## 2022-06-18 ENCOUNTER — Encounter: Payer: Self-pay | Admitting: Family Medicine

## 2022-06-18 ENCOUNTER — Ambulatory Visit (INDEPENDENT_AMBULATORY_CARE_PROVIDER_SITE_OTHER): Payer: Medicare HMO | Admitting: Family Medicine

## 2022-06-18 ENCOUNTER — Ambulatory Visit: Payer: Self-pay

## 2022-06-18 VITALS — BP 110/70 | Ht 68.0 in | Wt 216.0 lb

## 2022-06-18 DIAGNOSIS — M25551 Pain in right hip: Secondary | ICD-10-CM

## 2022-06-18 DIAGNOSIS — M1611 Unilateral primary osteoarthritis, right hip: Secondary | ICD-10-CM

## 2022-06-18 HISTORY — DX: Pain in right hip: M25.551

## 2022-06-18 MED ORDER — METHYLPREDNISOLONE ACETATE 40 MG/ML IJ SUSP
40.0000 mg | Freq: Once | INTRAMUSCULAR | Status: AC
  Administered 2022-06-18: 40 mg via INTRA_ARTICULAR

## 2022-06-18 NOTE — Patient Instructions (Signed)
Good to see you Please try ice as needed   Please send me a message in MyChart with any questions or updates.  Please see me back in 4 weeks or as needed if better.   --Dr. Raeford Razor
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
## 2022-06-18 NOTE — Assessment & Plan Note (Signed)
MRI was demonstrating mild degenerative changes.  Could consider an intra-articular injection if needed.

## 2022-06-18 NOTE — Assessment & Plan Note (Signed)
Having more pain laterally today. -Trochanteric bursa injection today.

## 2022-06-23 ENCOUNTER — Other Ambulatory Visit: Payer: Self-pay | Admitting: Medical

## 2022-06-23 DIAGNOSIS — M5412 Radiculopathy, cervical region: Secondary | ICD-10-CM | POA: Diagnosis not present

## 2022-06-23 DIAGNOSIS — M25852 Other specified joint disorders, left hip: Secondary | ICD-10-CM | POA: Diagnosis not present

## 2022-07-01 ENCOUNTER — Telehealth: Payer: Self-pay | Admitting: Medical

## 2022-07-01 NOTE — Telephone Encounter (Signed)
Left message for patient to call back and schedule Medicare Annual Wellness Visit (AWV).   Please offer to do virtually or by telephone.  Left office number and my jabber [phone].  AWVI eligible as of 08/09/2019  Please schedule at anytime with Nurse Health Advisor.

## 2022-07-02 ENCOUNTER — Ambulatory Visit (INDEPENDENT_AMBULATORY_CARE_PROVIDER_SITE_OTHER): Payer: Medicare HMO | Admitting: Psychology

## 2022-07-02 DIAGNOSIS — F4321 Adjustment disorder with depressed mood: Secondary | ICD-10-CM

## 2022-07-02 DIAGNOSIS — R69 Illness, unspecified: Secondary | ICD-10-CM | POA: Diagnosis not present

## 2022-07-02 NOTE — Progress Notes (Signed)
[facility] Counselor/Therapist Progress Note  Patient ID: SHYANNE MCCLARY, MRN: [id number],    Date: 07/02/2022  Time Spent: 11:00am-11:50am   50 minutes   Treatment Type: Individual Therapy  Reported Symptoms: stress, depression  Mental Status Exam: Appearance:  Casual and Neat     Behavior: Appropriate  Motor: Normal  Speech/Language:  Normal Rate  Affect: Appropriate  Mood: normal  Thought process: normal  Thought content:   WNL  Sensory/Perceptual disturbances:   WNL  Orientation: oriented to person, place, time/date, and situation  Attention: Good  Concentration: Good  Memory: WNL  Fund of knowledge:  Good  Insight:   Good  Judgment:  Good  Impulse Control: Good   Risk Assessment: Danger to Self:  No Self-injurious Behavior: No Danger to Others: No Duty to Warn:no Physical Aggression / Violence:No  Access to Firearms a concern: No  Gang Involvement:No   Subjective:  Pt present for face-to-face individual therapy via video Webex.  Pt consents to telehealth video session due to COVID 19 pandemic. Location of pt: home Location of therapist: home office.  Pt talked about her health.  She is still dealing with having rheumatoid arthritis.   She has started to work with a lifestyle physician and will be adjusting her diet.  He is an Armed forces training and education officer and lifestyle physician.   Pt will be changing her diet to a plant based diet.  Pt has a goal to do food prep on the weekends so she has healthy food to grab on the go.   Addressed pt's health concerns.    Pt states she still has periods of depression even though she is taking Wellbutrin.   Pt has not had the energy she would like to have to get things done.  Worked on Radiographer, therapeutic.   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  Pt hired an Environmental consultant but does not think he will work out.  Pt thinks she will need to fire him.   She had an interaction with her  assistant where she felt he was too agitated to discuss his work Systems analyst.   Addressed pt's concerns and frustrations and worked on how she can communicate with him.   Addressed how pt can increase self care.  Provided supportive therapy.    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   Pt is progressing toward treatment goals.    Treatment Plan  (Treatment Plan Target Date: 02/13/2023) Client Abilities/Strengths  Pt is bright, engaging, and motivated for therapy.   Client Treatment Preferences  Individual therapy.  Client Statement of Needs  Improve coping skills.  Symptoms  Depressed or irritable mood. Lack of energy. Social withdrawal. Unresolved grief issues.   Problems Addressed  Unipolar Depression Goals 1. Alleviate depressive symptoms and return to previous level of effective functioning. 2. Appropriately grieve the loss in order to normalize mood and to return to previously adaptive level of functioning. Objective Learn and implement behavioral strategies to overcome depression. Target Date: 2023-02-13 Frequency: Biweekly  Progress: 20 Modality: individual  Related Interventions Engage the client in "behavioral activation," increasing his/her activity level and contact with sources of reward, while identifying processes that inhibit activation.  Use behavioral techniques such as instruction, rehearsal, role-playing, role reversal, as needed, to facilitate activity in the client's daily life; reinforce success. Assist the client in developing skills that increase the likelihood of deriving pleasure  from behavioral activation (e.g., assertiveness skills, developing an exercise plan, less internal/more external focus, increased social involvement); reinforce success. Objective Identify important people in life, past and present, and describe the quality, good and poor, of those  relationships. Target Date: 2023-02-13 Frequency: Biweekly  Progress: 20 Modality: individual  Related Interventions Conduct Interpersonal Therapy beginning with the assessment of the client's "interpersonal inventory" of important past and present relationships; develop a case formulation linking depression to grief, interpersonal role disputes, role transitions, and/or interpersonal deficits). Objective Learn and implement problem-solving and decision-making skills. Target Date: 2023-02-13 Frequency: Biweekly  Progress: 20 Modality: individual  Related Interventions Conduct Problem-Solving Therapy using techniques such as psychoeducation, modeling, and role-playing to teach client problem-solving skills (i.e., defining a problem specifically, generating possible solutions, evaluating the pros and cons of each solution, selecting and implementing a plan of action, evaluating the efficacy of the plan, accepting or revising the plan); role-play application of the problem-solving skill to a real life issue. Encourage in the client the development of a positive problem orientation in which problems and solving them are viewed as a natural part of life and not something to be feared, despaired, or avoided. 3. Develop healthy interpersonal relationships that lead to the alleviation and help prevent the relapse of depression. 4. Develop healthy thinking patterns and beliefs about self, others, and the world that lead to the alleviation and help prevent the relapse of depression. 5. Recognize, accept, and cope with feelings of depression. 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

## 2022-07-15 ENCOUNTER — Other Ambulatory Visit: Payer: Self-pay | Admitting: Medical

## 2022-07-17 DIAGNOSIS — Z79899 Other long term (current) drug therapy: Secondary | ICD-10-CM | POA: Diagnosis not present

## 2022-07-17 DIAGNOSIS — M0579 Rheumatoid arthritis with rheumatoid factor of multiple sites without organ or systems involvement: Secondary | ICD-10-CM | POA: Diagnosis not present

## 2022-07-24 ENCOUNTER — Other Ambulatory Visit: Payer: Self-pay

## 2022-07-24 ENCOUNTER — Other Ambulatory Visit: Payer: Self-pay | Admitting: Medical

## 2022-07-24 DIAGNOSIS — M5412 Radiculopathy, cervical region: Secondary | ICD-10-CM | POA: Diagnosis not present

## 2022-07-24 DIAGNOSIS — I10 Essential (primary) hypertension: Secondary | ICD-10-CM

## 2022-07-24 DIAGNOSIS — M25852 Other specified joint disorders, left hip: Secondary | ICD-10-CM | POA: Diagnosis not present

## 2022-07-24 NOTE — Patient Outreach (Addendum)
  Care Coordination   07/24/2022 Name: Judy [name] MRN: [id number] DOB: 1951-08-21   Care Coordination Outreach Attempts:  Contact was made with the patient today to offer care coordination services as a benefit of their health plan. The patient requested a return call on a later date.  Patient requested RN contact her after 11:30 am today.  Follow Up Plan:  Additional outreach attempts will be made to offer the patient care coordination information and services.   Encounter Outcome:  Pt. Request to Call Back  Care Coordination Interventions Activated:  No   Care Coordination Interventions:  No, not indicated    Thea Silversmith, RN, MSN, BSN, CCM Care Coordinator [phone]

## 2022-07-24 NOTE — Patient Outreach (Signed)
  Care Coordination   07/24/2022 Name: Judy [name] MRN: [id number] DOB: Jun 25, 1951   Care Coordination Outreach Attempts:  An unsuccessful telephone outreach was attempted today to offer the patient information about available care coordination services as a benefit of their health plan.   Follow Up Plan:  Additional outreach attempts will be made to offer the patient care coordination information and services.   Encounter Outcome:  No Answer  Care Coordination Interventions Activated:  No   Care Coordination Interventions:  No, not indicated    Thea Silversmith, RN, MSN, BSN, CCM Care Coordinator [phone]

## 2022-07-25 DIAGNOSIS — Z961 Presence of intraocular lens: Secondary | ICD-10-CM | POA: Diagnosis not present

## 2022-07-25 DIAGNOSIS — H43813 Vitreous degeneration, bilateral: Secondary | ICD-10-CM | POA: Diagnosis not present

## 2022-07-30 ENCOUNTER — Ambulatory Visit (INDEPENDENT_AMBULATORY_CARE_PROVIDER_SITE_OTHER): Payer: Medicare HMO | Admitting: Psychology

## 2022-07-30 DIAGNOSIS — R69 Illness, unspecified: Secondary | ICD-10-CM | POA: Diagnosis not present

## 2022-07-30 DIAGNOSIS — F4321 Adjustment disorder with depressed mood: Secondary | ICD-10-CM
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
## 2022-08-07 ENCOUNTER — Other Ambulatory Visit: Payer: Self-pay | Admitting: Medical

## 2022-08-24 DIAGNOSIS — M25852 Other specified joint disorders, left hip: Secondary | ICD-10-CM | POA: Diagnosis not present

## 2022-08-24 DIAGNOSIS — M5412 Radiculopathy, cervical region: Secondary | ICD-10-CM | POA: Diagnosis not present

## 2022-08-28 ENCOUNTER — Telehealth: Payer: Self-pay

## 2022-08-28 NOTE — Patient Outreach (Signed)
  Care Coordination   Initial Visit Note   08/28/2022 Name: ODDIE KUHLMANN MRN: [id number] DOB: 1951/08/10  KAMARIA LUCIA is a 71 y.o. year old female who sees [facility], Percell Miller, [place] for primary care. I spoke with  [name] by phone today.  What matters to the patients health and wellness today?  Patient denies any care coordination, resource or disease management needs at this time.     Goals Addressed             This Visit's Progress    COMPLETED: Care Coordination Activities-no follow up required       Care Coordination Interventions: Discussed care coordination program Encouraged to contact Care coordinator and/or primary care provider if care coordination needs in the future. Contact number provided.         SDOH assessments and interventions completed:  Yes  SDOH Interventions Today    Flowsheet Row Most Recent Value  SDOH Interventions   Food Insecurity Interventions Intervention Not Indicated  Housing Interventions Intervention Not Indicated  Transportation Interventions Intervention Not Indicated  Utilities Interventions Intervention Not Indicated        Care Coordination Interventions Activated:  Yes  Care Coordination Interventions:  Yes, provided   Follow up plan: No further intervention required.   Encounter Outcome:  Pt. Visit Completed   Thea Silversmith, RN, MSN, BSN, [place] Coordinator [phone]

## 2022-08-29 ENCOUNTER — Ambulatory Visit (INDEPENDENT_AMBULATORY_CARE_PROVIDER_SITE_OTHER): Payer: Medicare HMO | Admitting: Family Medicine

## 2022-08-29 ENCOUNTER — Ambulatory Visit (INDEPENDENT_AMBULATORY_CARE_PROVIDER_SITE_OTHER): Payer: Medicare HMO | Admitting: *Deleted

## 2022-08-29 ENCOUNTER — Encounter: Payer: Self-pay | Admitting: Family Medicine

## 2022-08-29 VITALS — BP 120/70 | Ht 68.0 in | Wt 216.0 lb

## 2022-08-29 DIAGNOSIS — Z780 Asymptomatic menopausal state: Secondary | ICD-10-CM | POA: Diagnosis not present

## 2022-08-29 DIAGNOSIS — S39012A Strain of muscle, fascia and tendon of lower back, initial encounter: Secondary | ICD-10-CM

## 2022-08-29 DIAGNOSIS — Z0000 Encounter for general adult medical examination without abnormal findings: Secondary | ICD-10-CM

## 2022-08-29 HISTORY — DX: Strain of muscle, fascia and tendon of lower back, initial encounter: S39.012A

## 2022-08-29 MED ORDER — KETOROLAC TROMETHAMINE 30 MG/ML IJ SOLN
30.0000 mg | Freq: Once | INTRAMUSCULAR | Status: AC
  Administered 2022-08-29: 30 mg via INTRAMUSCULAR

## 2022-08-29 MED ORDER — METHYLPREDNISOLONE ACETATE 40 MG/ML IJ SUSP
40.0000 mg | Freq: Once | INTRAMUSCULAR | Status: AC
  Administered 2022-08-29: 40 mg via INTRAMUSCULAR
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
## 2022-08-29 NOTE — Assessment & Plan Note (Signed)
Acutely occurring. Most consistent with a strain. Localized to the lower left area.  - counseled on home exercise therapy and supportive care - IM depo-medrol and toradol  - counseled on compression  - could consider shockwave therapy or PT.

## 2022-08-29 NOTE — Patient Instructions (Signed)
Judy [name] , Thank you for taking time to come for your Medicare Wellness Visit. I appreciate your ongoing commitment to your health goals. Please review the following plan we discussed and let me know if I can assist you in the future.   These are the goals we discussed:  Goals   None     This is a list of the screening recommended for you and due dates:  Health Maintenance  Topic Date Due   Complete foot exam   Never done   Eye exam for diabetics  Never done   Yearly kidney health urinalysis for diabetes  Never done   Hepatitis C Screening: USPSTF Recommendation to screen - Ages 42-79 yo.  Never done   Zoster (Shingles) Vaccine (1 of 2) Never done   Tetanus Vaccine  12/08/2017   DEXA scan (bone density measurement)  Never done   COVID-19 Vaccine (6 - Pfizer risk series) 06/16/2022   Flu Shot  07/08/2022   Pneumonia Vaccine (1 - PCV) 12/11/2022*   Hemoglobin A1C  10/29/2022   Yearly kidney function blood test for diabetes  04/29/2023   Mammogram  10/01/2023   Colon Cancer Screening  04/09/2031   HPV Vaccine  Aged Out  *Topic was postponed. The date shown is not the original due date.      Next appointment: Follow up in one year for your annual wellness visit.   Preventive Care 69 Years and Older, Female Preventive care refers to lifestyle choices and visits with your health care provider that can promote health and wellness. What does preventive care include? A yearly physical exam. This is also called an annual well check. Dental exams once or twice a year. Routine eye exams. Ask your health care provider how often you should have your eyes checked. Personal lifestyle choices, including: Daily care of your teeth and gums. Regular physical activity. Eating a healthy diet. Avoiding tobacco and drug use. Limiting alcohol use. Practicing safe sex. Taking low-dose aspirin every day. Taking vitamin and mineral supplements as recommended by your health care provider. What  happens during an annual well check? The services and screenings done by your health care provider during your annual well check will depend on your age, overall health, lifestyle risk factors, and family history of disease. Counseling  Your health care provider may ask you questions about your: Alcohol use. Tobacco use. Drug use. Emotional well-being. Home and relationship well-being. Sexual activity. Eating habits. History of falls. Memory and ability to understand (cognition). Work and work Statistician. Reproductive health. Screening  You may have the following tests or measurements: Height, weight, and BMI. Blood pressure. Lipid and cholesterol levels. These may be checked every 5 years, or more frequently if you are over 24 years old. Skin check. Lung cancer screening. You may have this screening every year starting at age 69 if you have a 30-pack-year history of smoking and currently smoke or have quit within the past 15 years. Fecal occult blood test (FOBT) of the stool. You may have this test every year starting at age 69. Flexible sigmoidoscopy or colonoscopy. You may have a sigmoidoscopy every 5 years or a colonoscopy every 10 years starting at age 69. Hepatitis C blood test. Hepatitis B blood test. Sexually transmitted disease (STD) testing. Diabetes screening. This is done by checking your blood sugar (glucose) after you have not eaten for a while (fasting). You may have this done every 1-3 years. Bone density scan. This is done to screen for osteoporosis.  You may have this done starting at age 69. Mammogram. This may be done every 1-2 years. Talk to your health care provider about how often you should have regular mammograms. Talk with your health care provider about your test results, treatment options, and if necessary, the need for more tests. Vaccines  Your health care provider may recommend certain vaccines, such as: Influenza vaccine. This is recommended every  year. Tetanus, diphtheria, and acellular pertussis (Tdap, Td) vaccine. You may need a Td booster every 10 years. Zoster vaccine. You may need this after age 69. Pneumococcal 13-valent conjugate (PCV13) vaccine. One dose is recommended after age 69. Pneumococcal polysaccharide (PPSV23) vaccine. One dose is recommended after age 69. Talk to your health care provider about which screenings and vaccines you need and how often you need them. This information is not intended to replace advice given to you by your health care provider. Make sure you discuss any questions you have with your health care provider. Document Released: 12/21/2015 Document Revised: 08/13/2016 Document Reviewed: 09/25/2015 Elsevier Interactive Patient Education  2017 [employer] Prevention in the Home Falls can cause injuries. They can happen to people of all ages. There are many things you can do to make your home safe and to help prevent falls. What can I do on the outside of my home? Regularly fix the edges of walkways and driveways and fix any cracks. Remove anything that might make you trip as you walk through a door, such as a raised step or threshold. Trim any bushes or trees on the path to your home. Use bright outdoor lighting. Clear any walking paths of anything that might make someone trip, such as rocks or tools. Regularly check to see if handrails are loose or broken. Make sure that both sides of any steps have handrails. Any raised decks and porches should have guardrails on the edges. Have any leaves, snow, or ice cleared regularly. Use sand or salt on walking paths during winter. Clean up any spills in your garage right away. This includes oil or grease spills. What can I do in the bathroom? Use night lights. Install grab bars by the toilet and in the tub and shower. Do not use towel bars as grab bars. Use non-skid mats or decals in the tub or shower. If you need to sit down in the shower, use a  plastic, non-slip stool. Keep the floor dry. Clean up any water that spills on the floor as soon as it happens. Remove soap buildup in the tub or shower regularly. Attach bath mats securely with double-sided non-slip rug tape. Do not have throw rugs and other things on the floor that can make you trip. What can I do in the bedroom? Use night lights. Make sure that you have a light by your bed that is easy to reach. Do not use any sheets or blankets that are too big for your bed. They should not hang down onto the floor. Have a firm chair that has side arms. You can use this for support while you get dressed. Do not have throw rugs and other things on the floor that can make you trip. What can I do in the kitchen? Clean up any spills right away. Avoid walking on wet floors. Keep items that you use a lot in easy-to-reach places. If you need to reach something above you, use a strong step stool that has a grab bar. Keep electrical cords out of the way. Do not use  floor polish or wax that makes floors slippery. If you must use wax, use non-skid floor wax. Do not have throw rugs and other things on the floor that can make you trip. What can I do with my stairs? Do not leave any items on the stairs. Make sure that there are handrails on both sides of the stairs and use them. Fix handrails that are broken or loose. Make sure that handrails are as long as the stairways. Check any carpeting to make sure that it is firmly attached to the stairs. Fix any carpet that is loose or worn. Avoid having throw rugs at the top or bottom of the stairs. If you do have throw rugs, attach them to the floor with carpet tape. Make sure that you have a light switch at the top of the stairs and the bottom of the stairs. If you do not have them, ask someone to add them for you. What else can I do to help prevent falls? Wear shoes that: Do not have high heels. Have rubber bottoms. Are comfortable and fit you  well. Are closed at the toe. Do not wear sandals. If you use a stepladder: Make sure that it is fully opened. Do not climb a closed stepladder. Make sure that both sides of the stepladder are locked into place. Ask someone to hold it for you, if possible. Clearly mark and make sure that you can see: Any grab bars or handrails. First and last steps. Where the edge of each step is. Use tools that help you move around (mobility aids) if they are needed. These include: Canes. Walkers. Scooters. Crutches. Turn on the lights when you go into a dark area. Replace any light bulbs as soon as they burn out. Set up your furniture so you have a clear path. Avoid moving your furniture around. If any of your floors are uneven, fix them. If there are any pets around you, be aware of where they are. Review your medicines with your doctor. Some medicines can make you feel dizzy. This can increase your chance of falling. Ask your doctor what other things that you can do to help prevent falls. This information is not intended to replace advice given to you by your health care provider. Make sure you discuss any questions you have with your health care provider. Document Released: 09/20/2009 Document Revised: 05/01/2016 Document Reviewed: 12/29/2014 Elsevier Interactive Patient Education  2017 [employer].

## 2022-08-29 NOTE — Patient Instructions (Signed)
Good to see you Please use heat  Please consider compression  Please try the stretches   Please send me a message in MyChart with any questions or updates.  Please see me back in 4 weeks or as needed if better.   --Dr. Raeford Razor
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
## 2022-09-02 ENCOUNTER — Other Ambulatory Visit: Payer: Self-pay | Admitting: Family Medicine

## 2022-09-02 ENCOUNTER — Encounter: Payer: Self-pay | Admitting: Family Medicine

## 2022-09-02 ENCOUNTER — Telehealth: Payer: Self-pay | Admitting: Medical

## 2022-09-02 DIAGNOSIS — I10 Essential (primary) hypertension: Secondary | ICD-10-CM

## 2022-09-02 DIAGNOSIS — K219 Gastro-esophageal reflux disease without esophagitis: Secondary | ICD-10-CM

## 2022-09-02 MED ORDER — CANDESARTAN CILEXETIL 16 MG PO TABS: 16.0000 mg | ORAL_TABLET | Freq: Every day | ORAL | 1 refills | Status: DC

## 2022-09-02 MED ORDER — PANTOPRAZOLE SODIUM 40 MG PO TBEC: 40.0000 mg | DELAYED_RELEASE_TABLET | Freq: Every day | ORAL | 3 refills | Status: DC

## 2022-09-02 MED ORDER — BACLOFEN 10 MG PO TABS
ORAL_TABLET | ORAL | 1 refills | Status: DC
Start: 2022-09-02 — End: 2022-09-30
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
## 2022-09-02 NOTE — Telephone Encounter (Signed)
Rx sent 

## 2022-09-08 ENCOUNTER — Ambulatory Visit: Payer: Medicare HMO | Admitting: Psychology

## 2022-09-10 DIAGNOSIS — Z79899 Other long term (current) drug therapy: Secondary | ICD-10-CM | POA: Diagnosis not present

## 2022-09-10 DIAGNOSIS — R5383 Other fatigue: Secondary | ICD-10-CM | POA: Diagnosis not present

## 2022-09-10 DIAGNOSIS — M0579 Rheumatoid arthritis with rheumatoid factor of multiple sites without organ or systems involvement: Secondary | ICD-10-CM | POA: Diagnosis not present

## 2022-09-10 DIAGNOSIS — Z111 Encounter for screening for respiratory tuberculosis: Secondary | ICD-10-CM | POA: Diagnosis not present

## 2022-09-11 ENCOUNTER — Ambulatory Visit: Payer: Medicare HMO | Admitting: "Endocrinology

## 2022-09-16 ENCOUNTER — Other Ambulatory Visit (HOSPITAL_BASED_OUTPATIENT_CLINIC_OR_DEPARTMENT_OTHER): Payer: Medicare HMO

## 2022-09-17 ENCOUNTER — Ambulatory Visit: Payer: Medicare HMO | Admitting: Family Medicine

## 2022-09-17 ENCOUNTER — Ambulatory Visit: Payer: Self-pay

## 2022-09-17 ENCOUNTER — Ambulatory Visit (INDEPENDENT_AMBULATORY_CARE_PROVIDER_SITE_OTHER): Payer: Medicare HMO | Admitting: Family Medicine

## 2022-09-17 ENCOUNTER — Encounter: Payer: Self-pay | Admitting: Family Medicine

## 2022-09-17 ENCOUNTER — Ambulatory Visit (HOSPITAL_BASED_OUTPATIENT_CLINIC_OR_DEPARTMENT_OTHER)
Admission: RE | Admit: 2022-09-17 | Discharge: 2022-09-17 | Disposition: A | Discharge status: Home / Self Care | Payer: Medicare HMO | Source: Ambulatory Visit | Attending: Family Medicine | Admitting: Family Medicine

## 2022-09-17 VITALS — BP 127/72 | Ht 68.0 in | Wt 216.0 lb

## 2022-09-17 DIAGNOSIS — M47817 Spondylosis without myelopathy or radiculopathy, lumbosacral region: Secondary | ICD-10-CM

## 2022-09-17 DIAGNOSIS — M533 Sacrococcygeal disorders, not elsewhere classified: Secondary | ICD-10-CM

## 2022-09-17 DIAGNOSIS — M545 Low back pain, unspecified: Secondary | ICD-10-CM | POA: Diagnosis not present

## 2022-09-17 HISTORY — DX: Sacrococcygeal disorders, not elsewhere classified: M53.3

## 2022-09-17 HISTORY — DX: Spondylosis without myelopathy or radiculopathy, lumbosacral region: M47.817

## 2022-09-17 MED ORDER — TRIAMCINOLONE ACETONIDE 40 MG/ML IJ SUSP
40.0000 mg | Freq: Once | INTRAMUSCULAR | Status: AC
  Administered 2022-09-17: 40 mg via INTRA_ARTICULAR

## 2022-09-17 NOTE — Patient Instructions (Signed)
Good to see you Please use heat  Please continue stretching  Please keep me updated on your pain   Please send me a message in MyChart with any questions or updates.  Please see me back later this week if you want to inject the other side .   --Dr. Raeford Razor

## 2022-09-17 NOTE — Assessment & Plan Note (Signed)
Acutely occurring with severe pain.  Also having pain on the right side. -Counseled on home exercise therapy and supportive care. -Injection.

## 2022-09-17 NOTE — Assessment & Plan Note (Signed)
Acute on chronic in nature.  Has has known facet arthropathy from previous MRI in 2010.  Continues to have low back pain that is severe in nature.  Has tried medications and greater than 6 weeks of physician directed home exercise therapy. -Counseled on home exercise therapy and supportive care -X-ray. -MRI of the lumbar spine to evaluate for facet arthropathy for the consideration of facet injections.
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
## 2022-09-18 ENCOUNTER — Ambulatory Visit: Payer: Medicare HMO | Admitting: "Endocrinology

## 2022-09-18 ENCOUNTER — Telehealth: Payer: Self-pay | Admitting: Family Medicine

## 2022-09-18 DIAGNOSIS — M47817 Spondylosis without myelopathy or radiculopathy, lumbosacral region: Secondary | ICD-10-CM

## 2022-09-18 DIAGNOSIS — M533 Sacrococcygeal disorders, not elsewhere classified: Secondary | ICD-10-CM

## 2022-09-18 NOTE — Telephone Encounter (Signed)
Informed of results.   Rosemarie Ax, MD [facility] 09/18/2022, 2:51 PM

## 2022-09-20 ENCOUNTER — Other Ambulatory Visit: Payer: Self-pay | Admitting: Medical

## 2022-09-23 ENCOUNTER — Inpatient Hospital Stay (HOSPITAL_BASED_OUTPATIENT_CLINIC_OR_DEPARTMENT_OTHER): Admission: RE | Admit: 2022-09-23 | Payer: Medicare HMO | Source: Ambulatory Visit

## 2022-09-23 DIAGNOSIS — M5412 Radiculopathy, cervical region: Secondary | ICD-10-CM | POA: Diagnosis not present

## 2022-09-23 DIAGNOSIS — M25852 Other specified joint disorders, left hip: Secondary | ICD-10-CM | POA: Diagnosis not present

## 2022-09-24 NOTE — Addendum Note (Signed)
Addended by: Cresenciano Lick on: 09/24/2022 02:55 PM   Modules accepted: Orders

## 2022-09-30 ENCOUNTER — Ambulatory Visit (INDEPENDENT_AMBULATORY_CARE_PROVIDER_SITE_OTHER): Payer: Medicare HMO | Admitting: Family Medicine

## 2022-09-30 ENCOUNTER — Other Ambulatory Visit (HOSPITAL_BASED_OUTPATIENT_CLINIC_OR_DEPARTMENT_OTHER): Payer: Self-pay | Admitting: Medical

## 2022-09-30 ENCOUNTER — Ambulatory Visit (HOSPITAL_BASED_OUTPATIENT_CLINIC_OR_DEPARTMENT_OTHER)
Admission: RE | Admit: 2022-09-30 | Discharge: 2022-09-30 | Disposition: A | Discharge status: Home / Self Care | Payer: Medicare HMO | Source: Ambulatory Visit | Attending: Medical | Admitting: Medical

## 2022-09-30 ENCOUNTER — Encounter: Payer: Self-pay | Admitting: Family Medicine

## 2022-09-30 VITALS — BP 110/66 | Ht 68.0 in | Wt 216.0 lb

## 2022-09-30 DIAGNOSIS — M5416 Radiculopathy, lumbar region: Secondary | ICD-10-CM | POA: Insufficient documentation

## 2022-09-30 DIAGNOSIS — M533 Sacrococcygeal disorders, not elsewhere classified: Secondary | ICD-10-CM

## 2022-09-30 DIAGNOSIS — M47817 Spondylosis without myelopathy or radiculopathy, lumbosacral region: Secondary | ICD-10-CM | POA: Diagnosis not present

## 2022-09-30 DIAGNOSIS — Z1231 Encounter for screening mammogram for malignant neoplasm of breast: Secondary | ICD-10-CM

## 2022-09-30 DIAGNOSIS — Z780 Asymptomatic menopausal state: Secondary | ICD-10-CM | POA: Diagnosis not present

## 2022-09-30 HISTORY — DX: Radiculopathy, lumbar region: M54.16

## 2022-09-30 MED ORDER — PREDNISONE 5 MG PO TABS: ORAL_TABLET | ORAL | 0 refills | Status: DC

## 2022-09-30 MED ORDER — KETOROLAC TROMETHAMINE 30 MG/ML IJ SOLN
30.0000 mg | Freq: Once | INTRAMUSCULAR | Status: AC
  Administered 2022-09-30: 30 mg via INTRAMUSCULAR

## 2022-09-30 MED ORDER — HYDROCODONE-ACETAMINOPHEN 5-325 MG PO TABS: 1.0000 | ORAL_TABLET | Freq: Three times a day (TID) | ORAL | 0 refills | Status: DC | PRN

## 2022-09-30 MED ORDER — CYCLOBENZAPRINE HCL 10 MG PO TABS: 10.0000 mg | ORAL_TABLET | Freq: Three times a day (TID) | ORAL | 1 refills | Status: DC | PRN

## 2022-09-30 NOTE — Patient Instructions (Signed)
Good to see you Please continue heat  Please use the pain medicine as needed   Please send me a message in MyChart with any questions or updates.  We'll setup a virtual visit once the MRi is resulted.   --Dr. Raeford Razor
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
## 2022-09-30 NOTE — Assessment & Plan Note (Signed)
She did get relief with recent injection.  Pain has returned. -Counseled on home exercise therapy and supportive care. -Could consider physical therapy.

## 2022-09-30 NOTE — Assessment & Plan Note (Signed)
Acutely occurring with intermittent right-sided radicular type pain.  Having MRI this next Sunday. -Counseled on home exercise therapy and supportive care. - could consider epidural

## 2022-09-30 NOTE — Assessment & Plan Note (Signed)
Acutely occurring.  She is having pain in the left gluteus which could be coming from the facet joints.  Did get improvement with recent SI joint injection. -Counseled on home exercise therapy and supportive care. -Prednisone. -IM Toradol. -Flexeril. -Norco -Could consider facet injection

## 2022-10-05 ENCOUNTER — Ambulatory Visit
Admission: RE | Admit: 2022-10-05 | Discharge: 2022-10-05 | Disposition: A | Discharge status: Home / Self Care | Payer: Medicare HMO | Source: Ambulatory Visit | Attending: Family Medicine | Admitting: Family Medicine

## 2022-10-05 DIAGNOSIS — M533 Sacrococcygeal disorders, not elsewhere classified: Secondary | ICD-10-CM

## 2022-10-05 DIAGNOSIS — M5127 Other intervertebral disc displacement, lumbosacral region: Secondary | ICD-10-CM | POA: Diagnosis not present

## 2022-10-05 DIAGNOSIS — M47817 Spondylosis without myelopathy or radiculopathy, lumbosacral region: Secondary | ICD-10-CM

## 2022-10-05 DIAGNOSIS — M48061 Spinal stenosis, lumbar region without neurogenic claudication: Secondary | ICD-10-CM | POA: Diagnosis not present

## 2022-10-06 ENCOUNTER — Ambulatory Visit (INDEPENDENT_AMBULATORY_CARE_PROVIDER_SITE_OTHER): Payer: Medicare HMO | Admitting: Psychology

## 2022-10-06 DIAGNOSIS — R69 Illness, unspecified: Secondary | ICD-10-CM | POA: Diagnosis not present

## 2022-10-06 DIAGNOSIS — F4321 Adjustment disorder with depressed mood: Secondary | ICD-10-CM | POA: Diagnosis not present

## 2022-10-06 NOTE — Progress Notes (Signed)
[place] Counselor/Therapist Progress Note  Patient ID: Judy [name], MRN: [id number],    Date: 10/06/2022  Time Spent: 3:00pm-3:50pm   50 minutes   Treatment Type: Individual Therapy  Reported Symptoms: stress  Mental Status Exam: Appearance:  Casual and Neat     Behavior: Appropriate  Motor: Normal  Speech/Language:  Normal Rate  Affect: Appropriate  Mood: normal  Thought process: normal  Thought content:   WNL  Sensory/Perceptual disturbances:   WNL  Orientation: oriented to person, place, time/date, and situation  Attention: Good  Concentration: Good  Memory: WNL  Fund of knowledge:  Good  Insight:   Good  Judgment:  Good  Impulse Control: Good   Risk Assessment: Danger to Self:  No Self-injurious Behavior: No Danger to Others: No Duty to Warn:no Physical Aggression / Violence:No  Access to Firearms a concern: No  Gang Involvement:No   Subjective:  Pt present for face-to-face individual therapy via video Webex.  Pt consents to telehealth video session due to COVID 19 pandemic. Location of pt: home Location of therapist: home office.  Pt talked about her father dying two weeks ago unexpectedly.   He was 69 years old but was doing well.   He was active and involved in delivering meals for their church.   Helped pt process her feelings and grief.  Pt talked about her relationship with her father.  She states they had a "decent relationship".    Pt got to talk with him a couple of days before he passed.  Pt and family are still handling her father's affairs and making funeral plans.   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   She is working up a talk that she hopes to get on TED talks.  Pt is motivated to make a positive difference. Addressed how pt can increase self care.  Provided supportive therapy.    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   Pt is progressing toward treatment goals.    Treatment Plan  (Treatment Plan Target Date: 02/13/2023) Client Abilities/Strengths  Pt is bright, engaging, and motivated for therapy.   Client Treatment Preferences  Individual therapy.  Client Statement of Needs  Improve coping skills.  Symptoms  Depressed or irritable mood. Lack of energy. Social withdrawal. Unresolved grief issues.   Problems Addressed  Unipolar Depression Goals 1. Alleviate depressive symptoms and return to previous level of effective functioning. 2. Appropriately grieve the loss in order to normalize mood and to return to previously adaptive level of functioning. Objective Learn and implement behavioral strategies to overcome depression. Target Date: 2023-02-13 Frequency: Biweekly  Progress: 20 Modality: individual  Related Interventions Engage the client in "behavioral activation," increasing his/her activity level and contact with sources of reward, while identifying processes that inhibit activation.  Use behavioral techniques such as instruction, rehearsal, role-playing, role reversal, as needed, to facilitate activity in the client's daily life; reinforce success. Assist the client in developing skills that increase the likelihood of deriving pleasure from behavioral activation (e.g., assertiveness skills, developing an exercise plan, less internal/more external focus, increased social involvement); reinforce success. Objective Identify important people in life, past and present, and describe the quality, good and poor, of those relationships. Target Date: 2023-02-13 Frequency: Biweekly  Progress: 20 Modality: individual  Related Interventions Conduct Interpersonal Therapy beginning with the assessment of the client's "interpersonal inventory" of important  past and present relationships; develop a case formulation linking depression to grief, interpersonal role  disputes, role transitions, and/or interpersonal deficits). Objective Learn and implement problem-solving and decision-making skills. Target Date: 2023-02-13 Frequency: Biweekly  Progress: 20 Modality: individual  Related Interventions Conduct Problem-Solving Therapy using techniques such as psychoeducation, modeling, and role-playing to teach client problem-solving skills (i.e., defining a problem specifically, generating possible solutions, evaluating the pros and cons of each solution, selecting and implementing a plan of action, evaluating the efficacy of the plan, accepting or revising the plan); role-play application of the problem-solving skill to a real life issue. Encourage in the client the development of a positive problem orientation in which problems and solving them are viewed as a natural part of life and not something to be feared, despaired, or avoided. 3. Develop healthy interpersonal relationships that lead to the alleviation and help prevent the relapse of depression. 4. Develop healthy thinking patterns and beliefs about self, others, and the world that lead to the alleviation and help prevent the relapse of depression. 5. Recognize, accept, and cope with feelings of depression. 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

## 2022-10-07 ENCOUNTER — Inpatient Hospital Stay (HOSPITAL_BASED_OUTPATIENT_CLINIC_OR_DEPARTMENT_OTHER): Admission: RE | Admit: 2022-10-07 | Payer: Medicare HMO | Source: Ambulatory Visit

## 2022-10-16 ENCOUNTER — Ambulatory Visit: Payer: Medicare HMO | Admitting: "Endocrinology

## 2022-10-22 ENCOUNTER — Telehealth: Payer: Medicare HMO | Admitting: Family Medicine

## 2022-10-24 ENCOUNTER — Other Ambulatory Visit: Payer: Self-pay | Admitting: Medical

## 2022-10-24 DIAGNOSIS — M25852 Other specified joint disorders, left hip: Secondary | ICD-10-CM | POA: Diagnosis not present

## 2022-10-24 DIAGNOSIS — M5412 Radiculopathy, cervical region: Secondary | ICD-10-CM | POA: Diagnosis not present

## 2022-10-28 ENCOUNTER — Encounter: Payer: Self-pay | Admitting: Family Medicine

## 2022-10-28 ENCOUNTER — Telehealth (INDEPENDENT_AMBULATORY_CARE_PROVIDER_SITE_OTHER): Payer: Medicare HMO | Admitting: Family Medicine

## 2022-10-28 VITALS — Ht 68.0 in | Wt 216.0 lb

## 2022-10-28 DIAGNOSIS — M5416 Radiculopathy, lumbar region: Secondary | ICD-10-CM | POA: Diagnosis not present

## 2022-10-28 DIAGNOSIS — M47817 Spondylosis without myelopathy or radiculopathy, lumbosacral region: Secondary | ICD-10-CM

## 2022-10-28 NOTE — Assessment & Plan Note (Signed)
Recently has gotten improvement in previously debilitating pain.  MRI was showing different levels and areas of progression of her previous bulging and hypertrophy. -Counseled on exercise therapy and supportive care. -Could consider facet injection, epidural or PT.

## 2022-10-28 NOTE — Progress Notes (Signed)
Virtual Visit via Telephone Note  I connected with Judy [name] on 10/28/22 at  8:00 AM EST by telephone and verified that I am speaking with the correct person using two identifiers.  Location: Patient: work Provider: office   I discussed the limitations, risks, security and privacy concerns of performing an evaluation and management service by telephone and the availability of in person appointments. I also discussed with the patient that there may be a patient responsible charge related to this service. The patient expressed understanding and agreed to proceed.   History of Present Illness:  Judy [name] is a 71 year old female following up after the MRI of her lumbar spine.  This was demonstrating progression of the disc bulge and arthropathy at the L4-5 area.  She has currently gotten some improvement of the pain.    Observations/Objective:   Assessment and Plan:  Lumbar radiculopathy, facet arthropathy: Recently has gotten improvement in previously debilitating pain.  MRI was showing different levels and areas of progression of her previous bulging and hypertrophy. -Counseled on exercise therapy and supportive care. -Could consider facet injection, epidural or PT.  Follow Up Instructions:    I discussed the assessment and treatment plan with the patient. The patient was provided an opportunity to ask questions and all were answered. The patient agreed with the plan and demonstrated an understanding of the instructions.   The patient was advised to call back or seek an in-person evaluation if the symptoms worsen or if the condition fails to improve as anticipated.  I provided 6 minutes of non-face-to-face time during this encounter.   Clearance Coots, MD

## 2022-11-01 ENCOUNTER — Other Ambulatory Visit: Payer: Self-pay | Admitting: Family Medicine

## 2022-11-03 ENCOUNTER — Ambulatory Visit (INDEPENDENT_AMBULATORY_CARE_PROVIDER_SITE_OTHER): Payer: Medicare HMO | Admitting: Psychology

## 2022-11-03 ENCOUNTER — Ambulatory Visit (HOSPITAL_BASED_OUTPATIENT_CLINIC_OR_DEPARTMENT_OTHER)
Admission: RE | Admit: 2022-11-03 | Discharge: 2022-11-03 | Disposition: A | Discharge status: Home / Self Care | Payer: Medicare HMO | Source: Ambulatory Visit | Attending: Medical | Admitting: Medical

## 2022-11-03 ENCOUNTER — Encounter (HOSPITAL_BASED_OUTPATIENT_CLINIC_OR_DEPARTMENT_OTHER): Payer: Self-pay

## 2022-11-03 DIAGNOSIS — F4321 Adjustment disorder with depressed mood: Secondary | ICD-10-CM

## 2022-11-03 DIAGNOSIS — Z1231 Encounter for screening mammogram for malignant neoplasm of breast: Secondary | ICD-10-CM | POA: Diagnosis not present

## 2022-11-03 DIAGNOSIS — R69 Illness, unspecified: Secondary | ICD-10-CM | POA: Diagnosis not present

## 2022-11-03 NOTE — Progress Notes (Signed)
[facility] Counselor/Therapist Progress Note  Patient ID: PATI THINNES, MRN: [id number],    Date: 11/03/2022  Time Spent: 3:00pm-3:50pm   50 minutes   Treatment Type: Individual Therapy  Reported Symptoms: stress  Mental Status Exam: Appearance:  Casual and Neat     Behavior: Appropriate  Motor: Normal  Speech/Language:  Normal Rate  Affect: Appropriate  Mood: normal  Thought process: normal  Thought content:   WNL  Sensory/Perceptual disturbances:   WNL  Orientation: oriented to person, place, time/date, and situation  Attention: Good  Concentration: Good  Memory: WNL  Fund of knowledge:  Good  Insight:   Good  Judgment:  Good  Impulse Control: Good   Risk Assessment: Danger to Self:  No Self-injurious Behavior: No Danger to Others: No Duty to Warn:no Physical Aggression / Violence:No  Access to Firearms a concern: No  Gang Involvement:No   Subjective:  Pt present for face-to-face individual therapy via video Webex.  Pt consents to telehealth video session due to COVID 19 pandemic. Location of pt: home Location of therapist: home office.  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  Pt is working a couple of days a week.  Pt is glad to have a job where she can help people.   Many of the patients have heart breaking living situations and stories.  Addressed how this impacts pt.   Pt states she has learned that when she feels down she needs to get busy with something meaningful.   Pt has been getting back in the pool to do some exercises to help her back.   Addressed how pt can increase self care.  Provided supportive therapy.    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   Pt is progressing toward treatment goals.    Treatment Plan  (Treatment Plan Target Date: 02/13/2023) Client Abilities/Strengths  Pt is bright, engaging, and motivated for  therapy.   Client Treatment Preferences  Individual therapy.  Client Statement of Needs  Improve coping skills.  Symptoms  Depressed or irritable mood. Lack of energy. Social withdrawal. Unresolved grief issues.   Problems Addressed  Unipolar Depression Goals 1. Alleviate depressive symptoms and return to previous level of effective functioning. 2. Appropriately grieve the loss in order to normalize mood and to return to previously adaptive level of functioning. Objective Learn and implement behavioral strategies to overcome depression. Target Date: 2023-02-13 Frequency: Biweekly  Progress: 20 Modality: individual  Related Interventions Engage the client in "behavioral activation," increasing his/her activity level and contact with sources of reward, while identifying processes that inhibit activation.  Use behavioral techniques such as instruction, rehearsal, role-playing, role reversal, as needed, to facilitate activity in the client's daily life; reinforce success. Assist the client in developing skills that increase the likelihood of deriving pleasure from behavioral activation (e.g., assertiveness skills, developing an exercise plan, less internal/more external focus, increased social involvement); reinforce success. Objective Identify important people in life, past and present, and describe the quality, good and poor, of those relationships. Target Date: 2023-02-13 Frequency: Biweekly  Progress: 20 Modality: individual  Related Interventions Conduct Interpersonal Therapy beginning with the assessment of the client's "interpersonal inventory" of important past and present relationships; develop a case formulation linking depression to grief, interpersonal role disputes, role transitions, and/or interpersonal deficits). Objective Learn and implement problem-solving and decision-making skills. Target Date: 2023-02-13 Frequency: Biweekly  Progress: 20 Modality: individual  Related  Interventions Conduct Problem-Solving Therapy using techniques such as psychoeducation, modeling, and role-playing to teach client problem-solving skills (i.e., defining a problem specifically, generating possible solutions, evaluating the pros and cons of each solution, selecting and implementing a plan of action, evaluating the efficacy of the plan, accepting or revising the plan); role-play application of the problem-solving skill to a real life issue. Encourage in the client the development of a positive problem orientation in which problems and solving them are viewed as a natural part of life and not something to be feared, despaired, or avoided. 3. Develop healthy interpersonal relationships that lead to the alleviation and help prevent the relapse of depression. 4. Develop healthy thinking patterns and beliefs about self, others, and the world that lead to the alleviation and help prevent the relapse of depression. 5. Recognize, accept, and cope with feelings of depression. 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

## 2022-11-05 DIAGNOSIS — Z79899 Other long term (current) drug therapy: Secondary | ICD-10-CM | POA: Diagnosis not present

## 2022-11-05 DIAGNOSIS — M0579 Rheumatoid arthritis with rheumatoid factor of multiple sites without organ or systems involvement: Secondary | ICD-10-CM | POA: Diagnosis not present

## 2022-11-16 ENCOUNTER — Encounter: Payer: Self-pay | Admitting: "Endocrinology

## 2022-11-17 ENCOUNTER — Ambulatory Visit (INDEPENDENT_AMBULATORY_CARE_PROVIDER_SITE_OTHER): Payer: Medicare HMO | Admitting: Medical

## 2022-11-17 VITALS — BP 138/69 | HR 88 | Resp 18 | Ht 68.0 in | Wt 214.0 lb

## 2022-11-17 DIAGNOSIS — R3915 Urgency of urination: Secondary | ICD-10-CM | POA: Diagnosis not present

## 2022-11-17 DIAGNOSIS — R300 Dysuria: Secondary | ICD-10-CM | POA: Diagnosis not present

## 2022-11-17 LAB — POCT URINALYSIS DIPSTICK
Bilirubin, UA: NEGATIVE
Blood, UA: NEGATIVE
Clarity, UA: NEGATIVE
Color, UA: NEGATIVE
Glucose, UA: NEGATIVE
Ketones, UA: NEGATIVE
Leukocytes, UA: NEGATIVE
Nitrite, UA: NEGATIVE
Protein, UA: NEGATIVE
Spec Grav, UA: 1.01 (ref 1.010–1.025)
Urobilinogen, UA: 0.2 E.U./dL
pH, UA: 7 (ref 5.0–8.0)

## 2022-11-17 NOTE — Patient Instructions (Addendum)
Possible early urinary tract infection but urine looks clear.  Holding off on antibiotic presently unless symptoms worsen pending culture results. Nofify me if that occurs. Hydrate well. I am sending out a urine culture. We will notify your when the culture results are back.  If urine culture negative consider referral to urologist vs gyn  Also pt is scheduled for plastic surgery in [facility]. Pt has a preop form but she can't get it printed yet. Planning to get surgery December 29, 2021.      Follow up in 7 days for any urinary symptoms  or sooner if  needed.
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
## 2022-11-18 DIAGNOSIS — R3915 Urgency of urination: Secondary | ICD-10-CM | POA: Diagnosis not present

## 2022-11-18 DIAGNOSIS — R300 Dysuria: Secondary | ICD-10-CM | POA: Diagnosis not present

## 2022-11-19 ENCOUNTER — Ambulatory Visit: Payer: Medicare HMO | Admitting: "Endocrinology

## 2022-11-19 LAB — URINE CULTURE
MICRO NUMBER:: 14303230
Result:: NO GROWTH
SPECIMEN QUALITY:: ADEQUATE

## 2022-11-22 ENCOUNTER — Other Ambulatory Visit: Payer: Self-pay | Admitting: Medical

## 2022-11-23 DIAGNOSIS — M5412 Radiculopathy, cervical region: Secondary | ICD-10-CM | POA: Diagnosis not present

## 2022-11-23 DIAGNOSIS — M25852 Other specified joint disorders, left hip: Secondary | ICD-10-CM | POA: Diagnosis not present

## 2022-12-03 ENCOUNTER — Ambulatory Visit (HOSPITAL_BASED_OUTPATIENT_CLINIC_OR_DEPARTMENT_OTHER)
Admission: RE | Admit: 2022-12-03 | Discharge: 2022-12-03 | Disposition: A | Discharge status: Home / Self Care | Payer: Medicare HMO | Source: Ambulatory Visit | Attending: Medical | Admitting: Medical

## 2022-12-03 ENCOUNTER — Ambulatory Visit (INDEPENDENT_AMBULATORY_CARE_PROVIDER_SITE_OTHER): Payer: Medicare HMO | Admitting: Medical

## 2022-12-03 VITALS — BP 134/60 | HR 80 | Temp 98.2°F | Resp 18 | Ht 68.0 in | Wt 208.0 lb

## 2022-12-03 DIAGNOSIS — I10 Essential (primary) hypertension: Secondary | ICD-10-CM

## 2022-12-03 DIAGNOSIS — Z01818 Encounter for other preprocedural examination: Secondary | ICD-10-CM

## 2022-12-03 LAB — CBC WITH DIFFERENTIAL/PLATELET
Basophils Absolute: 0 10*3/uL (ref 0.0–0.1)
Basophils Relative: 0.5 % (ref 0.0–3.0)
Eosinophils Absolute: 0 10*3/uL (ref 0.0–0.7)
Eosinophils Relative: 0.2 % (ref 0.0–5.0)
HCT: 37 % (ref 36.0–46.0)
Hemoglobin: 12.1 g/dL (ref 12.0–15.0)
Lymphocytes Relative: 9.5 % — ABNORMAL LOW (ref 12.0–46.0)
Lymphs Abs: 0.6 10*3/uL — ABNORMAL LOW (ref 0.7–4.0)
MCHC: 32.6 g/dL (ref 30.0–36.0)
MCV: 86.8 fl (ref 78.0–100.0)
Monocytes Absolute: 0.2 10*3/uL (ref 0.1–1.0)
Monocytes Relative: 2.7 % — ABNORMAL LOW (ref 3.0–12.0)
Neutro Abs: 5.1 10*3/uL (ref 1.4–7.7)
Neutrophils Relative %: 87.1 % — ABNORMAL HIGH (ref 43.0–77.0)
Platelets: 342 10*3/uL (ref 150.0–400.0)
RBC: 4.26 Mil/uL (ref 3.87–5.11)
RDW: 14.8 % (ref 11.5–15.5)
WBC: 5.8 10*3/uL (ref 4.0–10.5)

## 2022-12-03 LAB — COMPREHENSIVE METABOLIC PANEL
ALT: 13 U/L (ref 0–35)
AST: 21 U/L (ref 0–37)
Albumin: 4.2 g/dL (ref 3.5–5.2)
Alkaline Phosphatase: 65 U/L (ref 39–117)
BUN: 10 mg/dL (ref 6–23)
CO2: 30 mEq/L (ref 19–32)
Calcium: 9.4 mg/dL (ref 8.4–10.5)
Chloride: 103 mEq/L (ref 96–112)
Creatinine, Ser: 0.96 mg/dL (ref 0.40–1.20)
GFR: 60.46 mL/min (ref >60.00)
Glucose, Bld: 108 mg/dL — ABNORMAL HIGH (ref 70–99)
Potassium: 4.3 mEq/L (ref 3.5–5.1)
Sodium: 138 mEq/L (ref 135–145)
Total Bilirubin: 0.8 mg/dL (ref 0.2–1.2)
Total Protein: 7.3 g/dL (ref 6.0–8.3)
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
## 2022-12-03 NOTE — Patient Instructions (Addendum)
Preop exam normal. Appears stable presently for upcoming surgery. Will get cbc, cmp and cxr for medical clearance evaluation.  EKG showed nsr but rt atrial enlargement borderline. Will plan to send note to your cardiologist to see if any significance in terms of clearing particularly in light of your prior echo, zio ptach and cardiac ct all which were good/normal.   Will fill out forms pending lab and cxr review.   Htn- well controlled under current meds.  RA- follow up with rheumatologist and remind him of your upcoming surgery.   Follow up date to be determined after lab review. If everything normal then 6 months of sooner if needed.

## 2022-12-06 DIAGNOSIS — M5412 Radiculopathy, cervical region: Secondary | ICD-10-CM | POA: Diagnosis not present

## 2022-12-06 DIAGNOSIS — M25852 Other specified joint disorders, left hip: Secondary | ICD-10-CM | POA: Diagnosis not present

## 2022-12-09 ENCOUNTER — Ambulatory Visit (INDEPENDENT_AMBULATORY_CARE_PROVIDER_SITE_OTHER): Payer: Medicare HMO | Admitting: Podiatry

## 2022-12-09 DIAGNOSIS — L603 Nail dystrophy: Secondary | ICD-10-CM | POA: Diagnosis not present

## 2022-12-09 DIAGNOSIS — L608 Other nail disorders: Secondary | ICD-10-CM | POA: Diagnosis not present

## 2022-12-09 DIAGNOSIS — L601 Onycholysis: Secondary | ICD-10-CM | POA: Diagnosis not present

## 2022-12-10 ENCOUNTER — Other Ambulatory Visit: Payer: Self-pay

## 2022-12-10 DIAGNOSIS — E782 Mixed hyperlipidemia: Secondary | ICD-10-CM

## 2022-12-10 DIAGNOSIS — Z6831 Body mass index (BMI) 31.0-31.9, adult: Secondary | ICD-10-CM | POA: Diagnosis not present

## 2022-12-10 DIAGNOSIS — H9191 Unspecified hearing loss, right ear: Secondary | ICD-10-CM | POA: Diagnosis not present

## 2022-12-10 DIAGNOSIS — R768 Other specified abnormal immunological findings in serum: Secondary | ICD-10-CM | POA: Diagnosis not present

## 2022-12-10 DIAGNOSIS — H209 Unspecified iridocyclitis: Secondary | ICD-10-CM | POA: Diagnosis not present

## 2022-12-10 DIAGNOSIS — M0579 Rheumatoid arthritis with rheumatoid factor of multiple sites without organ or systems involvement: Secondary | ICD-10-CM | POA: Diagnosis not present

## 2022-12-10 DIAGNOSIS — M5030 Other cervical disc degeneration, unspecified cervical region: Secondary | ICD-10-CM | POA: Diagnosis not present

## 2022-12-10 DIAGNOSIS — M25551 Pain in right hip: Secondary | ICD-10-CM | POA: Diagnosis not present

## 2022-12-10 DIAGNOSIS — G629 Polyneuropathy, unspecified: Secondary | ICD-10-CM | POA: Diagnosis not present

## 2022-12-10 DIAGNOSIS — E669 Obesity, unspecified: Secondary | ICD-10-CM | POA: Diagnosis not present

## 2022-12-10 DIAGNOSIS — Z79899 Other long term (current) drug therapy: Secondary | ICD-10-CM | POA: Diagnosis not present

## 2022-12-10 NOTE — Progress Notes (Signed)
  Subjective:  Patient ID: Judy [name], female    DOB: 03-03-1951,  MRN: [id number]  Chief Complaint  Patient presents with   Nail Problem    Big toes very painful and dark discoloration in middle of toes. Having a sheet touch them is very painful    72 y.o. female presents with the above complaint. History confirmed with patient.  This developed very acutely a few weeks ago.  She removed her nail polish and noted discoloration, the pain is improving but the discoloration remains  Objective:  Physical Exam: warm, good capillary refill, no trophic changes or ulcerative lesions, normal DP and PT pulses, normal sensory exam, and bilateral hallux nail dystrophy and brown-yellow discoloration with subungual debris.  Assessment:   1. Nail dystrophy      Plan:  Patient was evaluated and treated and all questions answered.  Suspect she is developing onychomycosis infection with dystrophy.  I recommended culture of the nail plates today.  These were taken and sent to [facility] pathology.  We discussed treatment of this including oral and topical therapy.  I will let her know what her results show and we will discuss further treatment and followup thereafter.  Return if symptoms worsen or fail to improve.

## 2022-12-11 LAB — LIPID PANEL
Chol/HDL Ratio: 4 ratio (ref 0.0–4.4)
Cholesterol, Total: 238 mg/dL — ABNORMAL HIGH (ref 100–199)
HDL: 60 mg/dL (ref >39)
LDL Chol Calc (NIH): 161 mg/dL — ABNORMAL HIGH (ref 0–99)
Triglycerides: 97 mg/dL (ref 0–149)
VLDL Cholesterol Cal: 17 mg/dL (ref 5–40)

## 2022-12-15 ENCOUNTER — Encounter: Payer: Self-pay | Admitting: Medical

## 2022-12-15 NOTE — Telephone Encounter (Signed)
Forms faxed

## 2022-12-17 ENCOUNTER — Ambulatory Visit (INDEPENDENT_AMBULATORY_CARE_PROVIDER_SITE_OTHER): Payer: Medicare HMO | Admitting: "Endocrinology

## 2022-12-17 ENCOUNTER — Encounter: Payer: Self-pay | Admitting: "Endocrinology

## 2022-12-17 VITALS — BP 136/72 | HR 88 | Ht 68.0 in | Wt 212.2 lb

## 2022-12-17 DIAGNOSIS — R7303 Prediabetes: Secondary | ICD-10-CM | POA: Diagnosis not present

## 2022-12-17 DIAGNOSIS — E782 Mixed hyperlipidemia: Secondary | ICD-10-CM

## 2022-12-17 DIAGNOSIS — I10 Essential (primary) hypertension: Secondary | ICD-10-CM

## 2022-12-17 LAB — POCT GLYCOSYLATED HEMOGLOBIN (HGB A1C): HbA1c, POC (prediabetic range): 5.4 % — AB (ref 5.7–6.4)

## 2022-12-17 NOTE — Patient Instructions (Signed)
                                     Advice for Weight Management  -For most of us the best way to lose weight is by diet management. Generally speaking, diet management means consuming less calories intentionally which over time brings about progressive weight loss.  This can be achieved more effectively by avoiding ultra processed carbohydrates, processed meats, unhealthy fats.    It is critically important to know your numbers: how much calorie you are consuming and how much calorie you need. More importantly, our carbohydrates sources should be unprocessed naturally occurring  complex starch food items.  It is always important to balance nutrition also by  appropriate intake of proteins (mainly plant-based), healthy fats/oils, plenty of fruits and vegetables.   -The American College of Lifestyle Medicine (ACL M) recommends nutrition derived mostly from Whole Food, Plant Predominant Sources example an apple instead of applesauce or apple pie. Eat Plenty of vegetables, Mushrooms, fruits, Legumes, Whole Grains, Nuts, seeds in lieu of processed meats, processed snacks/pastries red meat, poultry, eggs.  Use only water or unsweetened tea for hydration.  The College also recommends the need to stay away from risky substances including alcohol, smoking; obtaining 7-9 hours of restorative sleep, at least 150 minutes of moderate intensity exercise weekly, importance of healthy social connections, and being mindful of stress and seek help when it is overwhelming.    -Sticking to a routine mealtime to eat 3 meals a day and avoiding unnecessary snacks is shown to have a big role in weight control. Under normal circumstances, the only time we burn stored energy is when we are hungry, so allow  some hunger to take place- hunger means no food between appropriate meal times, only water.  It is not advisable to starve.   -It is better to avoid simple carbohydrates including:  Cakes, Sweet Desserts, Ice Cream, Soda (diet and regular), Sweet Tea, Candies, Chips, Cookies, Store Bought Juices, Alcohol in Excess of  1-2 drinks a day, Lemonade,  Artificial Sweeteners, Doughnuts, Coffee Creamers, "Sugar-free" Products, etc, etc.  This is not a complete list.....    -Consulting with certified diabetes educators is proven to provide you with the most accurate and current information on diet.  Also, you may be  interested in discussing diet options/exchanges , we can schedule a visit with Judy [name], RDN, CDE for individualized nutrition education.  -Exercise: If you are able: 30 -60 minutes a day ,4 days a week, or 150 minutes of moderate intensity exercise weekly.    The longer the better if tolerated.  Combine stretch, strength, and aerobic activities.  If you were told in the past that you have high risk for cardiovascular diseases, or if you are currently symptomatic, you may seek evaluation by your heart doctor prior to initiating moderate to intense exercise programs.                                  Additional Care Considerations for Diabetes/Prediabetes   -Diabetes  is a chronic disease.  The most important care consideration is regular follow-up with your diabetes care provider with the goal being avoiding or delaying its complications and to take advantage of advances in medications and technology.  If appropriate actions are taken early enough, type 2 diabetes can even be   reversed.  Seek information from the right source.  - Whole Food, Plant Predominant Nutrition is highly recommended: Eat Plenty of vegetables, Mushrooms, fruits, Legumes, Whole Grains, Nuts, seeds in lieu of processed meats, processed snacks/pastries red meat, poultry, eggs as recommended by American College of  Lifestyle Medicine (ACLM).  -Type 2 diabetes is known to coexist with other important comorbidities such as high blood pressure and high cholesterol.  It is critical to control not only the  diabetes but also the high blood pressure and high cholesterol to minimize and delay the risk of complications including coronary artery disease, stroke, amputations, blindness, etc.  The good news is that this diet recommendation for type 2 diabetes is also very helpful for managing high cholesterol and high blood blood pressure.  - Studies showed that people with diabetes will benefit from a class of medications known as ACE inhibitors and statins.  Unless there are specific reasons not to be on these medications, the standard of care is to consider getting one from these groups of medications at an optimal doses.  These medications are generally considered safe and proven to help protect the heart and the kidneys.    - People with diabetes are encouraged to initiate and maintain regular follow-up with eye doctors, foot doctors, dentists , and if necessary heart and kidney doctors.     - It is highly recommended that people with diabetes quit smoking or stay away from smoking, and get yearly  flu vaccine and pneumonia vaccine at least every 5 years.  See above for additional recommendations on exercise, sleep, stress management , and healthy social connections.      
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
## 2022-12-18 ENCOUNTER — Other Ambulatory Visit: Payer: Self-pay | Admitting: Family Medicine

## 2022-12-22 ENCOUNTER — Other Ambulatory Visit: Payer: Self-pay | Admitting: Medical

## 2022-12-23 ENCOUNTER — Encounter: Payer: Self-pay | Admitting: Podiatry

## 2023-01-02 ENCOUNTER — Ambulatory Visit (INDEPENDENT_AMBULATORY_CARE_PROVIDER_SITE_OTHER): Payer: Medicare HMO | Admitting: Psychology

## 2023-01-02 DIAGNOSIS — F4321 Adjustment disorder with depressed mood: Secondary | ICD-10-CM

## 2023-01-02 DIAGNOSIS — R69 Illness, unspecified: Secondary | ICD-10-CM | POA: Diagnosis not present

## 2023-01-02 NOTE — Progress Notes (Signed)
[place] Counselor/Therapist Progress Note  Patient ID: Judy [name], MRN: [id number],    Date: 01/02/2023  Time Spent: 3:00pm-3:55pm   55 minutes   Treatment Type: Individual Therapy  Reported Symptoms: stress  Mental Status Exam: Appearance:  Casual and Neat     Behavior: Appropriate  Motor: Normal  Speech/Language:  Normal Rate  Affect: Appropriate  Mood: normal  Thought process: normal  Thought content:   WNL  Sensory/Perceptual disturbances:   WNL  Orientation: oriented to person, place, time/date, and situation  Attention: Good  Concentration: Good  Memory: WNL  Fund of knowledge:  Good  Insight:   Good  Judgment:  Good  Impulse Control: Good   Risk Assessment: Danger to Self:  No Self-injurious Behavior: No Danger to Others: No Duty to Warn:no Physical Aggression / Violence:No  Access to Firearms a concern: No  Gang Involvement:No   Subjective:  Pt present for face-to-face individual therapy via video Webex.  Pt consents to telehealth video session due to COVID 19 pandemic. Location of pt: home Location of therapist: home office.  Pt talked about the holidays.   She spent Christmas with her sisters and had a nice time.   Pt talked about the job she was working.  She does not work there anymore.  Addressed the challenges of the job.  The patients' situations were very sad.  The demands of the job were more than pt felt she could meet long term.  Management was not good.  Pt was getting tired and depressed and distressed.   Addressed how pt coped. Pt states it hurt her feelings that the job was going to let her go.  She quit before they could fire her.  Helped pt process her feelings. 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   Pt is working on doing a TED talk.  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   Addressed how pt can increase self care.  Provided  supportive therapy.    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   Pt is progressing toward treatment goals.    Treatment Plan  (Treatment Plan Target Date: 02/13/2023) Client Abilities/Strengths  Pt is bright, engaging, and motivated for therapy.   Client Treatment Preferences  Individual therapy.  Client Statement of Needs  Improve coping skills.  Symptoms  Depressed or irritable mood. Lack of energy. Social withdrawal. Unresolved grief issues.   Problems Addressed  Unipolar Depression Goals 1. Alleviate depressive symptoms and return to previous level of effective functioning. 2. Appropriately grieve the loss in order to normalize mood and to return to previously adaptive level of functioning. Objective Learn and implement behavioral strategies to overcome depression. Target Date: 2023-02-13 Frequency: Biweekly  Progress: 20 Modality: individual  Related Interventions Engage the client in "behavioral activation," increasing his/her activity level and contact with sources of reward, while identifying processes that inhibit activation.  Use behavioral techniques such as instruction, rehearsal, role-playing, role reversal, as needed, to facilitate activity in the client's daily life; reinforce success. Assist the client in developing skills that increase the likelihood of deriving pleasure from behavioral activation (e.g., assertiveness skills, developing an exercise plan, less internal/more external focus, increased social involvement); reinforce success. Objective Identify important people in life, past and present, and describe the quality, good and poor, of those  relationships. Target Date: 2023-02-13 Frequency: Biweekly  Progress: 20 Modality: individual  Related Interventions Conduct Interpersonal Therapy beginning with the assessment of the client's "interpersonal inventory" of important past  and present relationships; develop a case formulation linking depression to grief, interpersonal role disputes, role transitions, and/or interpersonal deficits). Objective Learn and implement problem-solving and decision-making skills. Target Date: 2023-02-13 Frequency: Biweekly  Progress: 20 Modality: individual  Related Interventions Conduct Problem-Solving Therapy using techniques such as psychoeducation, modeling, and role-playing to teach client problem-solving skills (i.e., defining a problem specifically, generating possible solutions, evaluating the pros and cons of each solution, selecting and implementing a plan of action, evaluating the efficacy of the plan, accepting or revising the plan); role-play application of the problem-solving skill to a real life issue. Encourage in the client the development of a positive problem orientation in which problems and solving them are viewed as a natural part of life and not something to be feared, despaired, or avoided. 3. Develop healthy interpersonal relationships that lead to the alleviation and help prevent the relapse of depression. 4. Develop healthy thinking patterns and beliefs about self, others, and the world that lead to the alleviation and help prevent the relapse of depression. 5. Recognize, accept, and cope with feelings of depression. 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

## 2023-01-19 DIAGNOSIS — M0579 Rheumatoid arthritis with rheumatoid factor of multiple sites without organ or systems involvement: Secondary | ICD-10-CM | POA: Diagnosis not present

## 2023-01-24 ENCOUNTER — Other Ambulatory Visit: Payer: Self-pay | Admitting: Medical

## 2023-01-29 ENCOUNTER — Ambulatory Visit: Payer: Medicare HMO | Admitting: Psychology

## 2023-01-31 ENCOUNTER — Other Ambulatory Visit: Payer: Self-pay | Admitting: Medical

## 2023-02-16 ENCOUNTER — Encounter: Payer: Self-pay | Admitting: Medical

## 2023-02-17 ENCOUNTER — Ambulatory Visit (INDEPENDENT_AMBULATORY_CARE_PROVIDER_SITE_OTHER): Payer: Medicare HMO | Admitting: Medical

## 2023-02-17 VITALS — BP 136/70 | HR 70 | Temp 98.4°F | Resp 18 | Ht 68.0 in | Wt 215.0 lb

## 2023-02-17 DIAGNOSIS — L089 Local infection of the skin and subcutaneous tissue, unspecified: Secondary | ICD-10-CM | POA: Diagnosis not present

## 2023-02-17 DIAGNOSIS — S61011A Laceration without foreign body of right thumb without damage to nail, initial encounter: Secondary | ICD-10-CM

## 2023-02-17 MED ORDER — DOXYCYCLINE HYCLATE 100 MG PO TABS: 100.0000 mg | ORAL_TABLET | Freq: Two times a day (BID) | ORAL | 0 refills | Status: DC

## 2023-02-17 NOTE — Patient Instructions (Signed)
Skin infection of small lacerated area. On inspection/evaluation infected but no abscess formation. Start doxycycline oral antibiotic. Rx advisement given. Apply warm compress. If area of redness expand, soft in center, swells or tracking up arm notify me.  Update me on your tetanus. If got done in 2019.  Follow up in 10 days or sooner if needed.

## 2023-02-17 NOTE — Progress Notes (Signed)
   Subjective:    Patient ID: Judy [name], female    DOB: 05-31-1951, 72 y.o.   MRN: [id number]  HPI Rt thumb small laceration about 10 days ago. She states cut cooking. Over next week gradual got sore and now area is very tender.  No recent tetanus.  Pt states early on may have put neosporin in very beginning.  No fever, no chills. No sweats. No tracking up arm.  Pt thinks she got tetanus vaccine in 2019 before going to [place].   Review of Systems  Constitutional:  Negative for chills, fatigue and fever.  Respiratory:  Negative for cough, chest tightness, shortness of breath and wheezing.   Cardiovascular:  Negative for chest pain and palpitations.  Gastrointestinal:  Negative for abdominal pain and constipation.  Genitourinary:  Negative for dysuria.  Musculoskeletal:  Negative for back pain.  Skin:        Rt thumb skin infection post cut.  Neurological:  Negative for dizziness, seizures, syncope and light-headedness.  Hematological:  Negative for adenopathy. Does not bruise/bleed easily.  Psychiatric/Behavioral:  Negative for behavioral problems and confusion.        Objective:   Physical Exam  General- No acute distress. Pleasant patient. Neck- Full range of motion, no jvd Lungs- Clear, even and unlabored. Heart- regular rate and rhythm. Neurologic- CNII- XII grossly intact.   Skin- rt thumb lateral aspect mid portion. Small 3 mm area that is healing(faint 3 mm around of surrounding pink redness.. Very tender to touch. Indurated. Not fluctantant. No swelling of thumb. Good rom.      Assessment & Plan:   Patient Instructions  Skin infection of small lacerated area. On inspection/evaluation infected but no abscess formation. Start doxycycline oral antibiotic. Rx advisement given. Apply warm compress. If area of redness expand, soft in center, swells or tracking up arm notify me.  Update me on your tetanus. If got done in 2019.  Follow up in 10 days or sooner if  needed.   Mackie Pai, PA-C

## 2023-02-20 ENCOUNTER — Ambulatory Visit: Payer: Medicare HMO | Admitting: Medical

## 2023-03-05 ENCOUNTER — Ambulatory Visit (INDEPENDENT_AMBULATORY_CARE_PROVIDER_SITE_OTHER): Payer: Medicare HMO | Admitting: Psychology

## 2023-03-05 DIAGNOSIS — F4321 Adjustment disorder with depressed mood: Secondary | ICD-10-CM

## 2023-03-05 DIAGNOSIS — R69 Illness, unspecified: Secondary | ICD-10-CM | POA: Diagnosis not present
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
## 2023-03-16 DIAGNOSIS — Z79899 Other long term (current) drug therapy: Secondary | ICD-10-CM | POA: Diagnosis not present

## 2023-03-16 DIAGNOSIS — M0579 Rheumatoid arthritis with rheumatoid factor of multiple sites without organ or systems involvement: Secondary | ICD-10-CM | POA: Diagnosis not present

## 2023-03-16 DIAGNOSIS — R5383 Other fatigue: Secondary | ICD-10-CM | POA: Diagnosis not present

## 2023-03-19 ENCOUNTER — Ambulatory Visit: Payer: Medicare HMO | Admitting: "Endocrinology

## 2023-03-23 ENCOUNTER — Encounter: Payer: Self-pay | Admitting: *Deleted

## 2023-03-25 ENCOUNTER — Telehealth: Payer: Self-pay | Admitting: Medical

## 2023-03-25 NOTE — Telephone Encounter (Signed)
Copied from CRM [id number]. Topic: Medicare AWV >> Mar 25, 2023 10:44 AM Payton Doughty wrote: Reason for CRM: Called patient to schedule Medicare Annual Wellness Visit (AWV). Left message for patient to call back and schedule Medicare Annual Wellness Visit (AWV).  Last date of AWV: NONE  Please schedule an appointment at any time with Donne Anon, CMA  .  If any questions, please contact me.  Thank you ,  Verlee Rossetti; Care Guide Ambulatory Clinical Support [place] [facility] Direct Dial: [phone]

## 2023-03-29 ENCOUNTER — Other Ambulatory Visit: Payer: Self-pay | Admitting: Medical

## 2023-04-02 ENCOUNTER — Ambulatory Visit (INDEPENDENT_AMBULATORY_CARE_PROVIDER_SITE_OTHER): Payer: Medicare HMO | Admitting: Medical

## 2023-04-02 VITALS — BP 115/60 | HR 94 | Resp 18 | Ht 68.0 in | Wt 217.2 lb

## 2023-04-02 DIAGNOSIS — R42 Dizziness and giddiness: Secondary | ICD-10-CM

## 2023-04-02 DIAGNOSIS — R739 Hyperglycemia, unspecified: Secondary | ICD-10-CM | POA: Diagnosis not present

## 2023-04-02 DIAGNOSIS — E782 Mixed hyperlipidemia: Secondary | ICD-10-CM | POA: Diagnosis not present

## 2023-04-02 DIAGNOSIS — I10 Essential (primary) hypertension: Secondary | ICD-10-CM | POA: Diagnosis not present

## 2023-04-02 DIAGNOSIS — K219 Gastro-esophageal reflux disease without esophagitis: Secondary | ICD-10-CM

## 2023-04-02 MED ORDER — MECLIZINE HCL 12.5 MG PO TABS: 12.5000 mg | ORAL_TABLET | Freq: Three times a day (TID) | ORAL | 0 refills | Status: DC | PRN

## 2023-04-02 NOTE — Patient Instructions (Addendum)
1. Gastroesophageal reflux disease without esophagitis Continue protonix and add on famotadine. Recommend calling Dr. Chales Abrahams office to get recheduled as back in 2022 note asked you to follow up.  2. Hypertension, unspecified type Bp well controlled. Controlled  on atacand 16 mg daily.  3. Mixed hyperlipidemia Following vegetarian diet recently. Risk score review but also review prior ct calcium score of zero. Can get integrative med MD opinion after rehceck lipid panel post vegitarian diet.  4. Vertigo Vertigo with good neurologic exam except transient vertigo turning head to left. On review this was same side vertigo in past. Can refer back to PT if not tapering off. If veritgo greater than 5 minutes can try meclizine. If any motor or sensory deficts associate then be seen in ED.  Will get cmp , A1c and cmp today.  Follow up in 3 months or sooner if needed.
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
## 2023-04-08 ENCOUNTER — Encounter: Payer: Self-pay | Admitting: Medical

## 2023-04-08 NOTE — Addendum Note (Signed)
Addended by: Gwenevere Abbot on: 04/08/2023 06:36 PM   Modules accepted: Orders
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
## 2023-04-15 ENCOUNTER — Ambulatory Visit: Payer: Medicare HMO | Attending: Medical | Admitting: Physical Therapy

## 2023-04-15 ENCOUNTER — Encounter: Payer: Self-pay | Admitting: Physical Therapy

## 2023-04-15 DIAGNOSIS — M542 Cervicalgia: Secondary | ICD-10-CM | POA: Diagnosis not present

## 2023-04-15 DIAGNOSIS — H8112 Benign paroxysmal vertigo, left ear: Secondary | ICD-10-CM | POA: Insufficient documentation

## 2023-04-15 DIAGNOSIS — R42 Dizziness and giddiness: Secondary | ICD-10-CM | POA: Insufficient documentation

## 2023-04-16 ENCOUNTER — Ambulatory Visit: Payer: Medicare HMO | Admitting: Physical Therapy

## 2023-04-16 ENCOUNTER — Encounter: Payer: Self-pay | Admitting: Physical Therapy

## 2023-04-16 DIAGNOSIS — R42 Dizziness and giddiness: Secondary | ICD-10-CM | POA: Diagnosis not present

## 2023-04-16 DIAGNOSIS — H8112 Benign paroxysmal vertigo, left ear: Secondary | ICD-10-CM

## 2023-04-16 DIAGNOSIS — M542 Cervicalgia: Secondary | ICD-10-CM
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
## 2023-04-20 ENCOUNTER — Ambulatory Visit: Payer: Medicare HMO | Admitting: Physical Therapy

## 2023-04-20 ENCOUNTER — Encounter: Payer: Self-pay | Admitting: Physical Therapy

## 2023-04-20 DIAGNOSIS — M542 Cervicalgia: Secondary | ICD-10-CM

## 2023-04-20 DIAGNOSIS — R42 Dizziness and giddiness: Secondary | ICD-10-CM

## 2023-04-20 DIAGNOSIS — H8112 Benign paroxysmal vertigo, left ear: Secondary | ICD-10-CM | POA: Diagnosis not present
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
## 2023-04-24 ENCOUNTER — Ambulatory Visit: Payer: Medicare HMO | Admitting: "Endocrinology

## 2023-04-30 ENCOUNTER — Ambulatory Visit: Payer: Medicare HMO | Admitting: "Endocrinology

## 2023-05-09 ENCOUNTER — Other Ambulatory Visit: Payer: Self-pay | Admitting: Medical

## 2023-05-14 ENCOUNTER — Encounter: Payer: Self-pay | Admitting: Physical Therapy

## 2023-05-14 DIAGNOSIS — Z79899 Other long term (current) drug therapy: Secondary | ICD-10-CM | POA: Diagnosis not present

## 2023-05-14 DIAGNOSIS — M0579 Rheumatoid arthritis with rheumatoid factor of multiple sites without organ or systems involvement: Secondary | ICD-10-CM | POA: Diagnosis not present

## 2023-05-18 ENCOUNTER — Ambulatory Visit (INDEPENDENT_AMBULATORY_CARE_PROVIDER_SITE_OTHER): Payer: Medicare HMO

## 2023-05-18 ENCOUNTER — Ambulatory Visit (INDEPENDENT_AMBULATORY_CARE_PROVIDER_SITE_OTHER): Payer: Medicare HMO | Admitting: Sports Medicine

## 2023-05-18 VITALS — BP 118/80 | HR 62 | Ht 68.0 in | Wt 219.0 lb

## 2023-05-18 DIAGNOSIS — M25562 Pain in left knee: Secondary | ICD-10-CM | POA: Diagnosis not present

## 2023-05-18 DIAGNOSIS — G8929 Other chronic pain: Secondary | ICD-10-CM

## 2023-05-18 DIAGNOSIS — M1712 Unilateral primary osteoarthritis, left knee: Secondary | ICD-10-CM

## 2023-05-18 NOTE — Progress Notes (Signed)
Judy [name] [address] [place] [address] Phone: [phone]   Assessment and Plan:     1. Chronic pain of left knee 2. Primary osteoarthritis of left knee  -Chronic with exacerbation, initial sports medicine visit -Consistent with flare of osteoarthritis, though cannot rule out meniscal pathology such on presentation today - Patient elected for intra-articular CSI.  Tolerated well per note below - May continue Celebrex as needed for day-to-day pain relief - Start HEP for knee - X-ray obtained in clinic.  My interpretation: No acute fracture or dislocation.  Moderate degenerative changes in medial compartment and patellofemoral compartment  Procedure: Knee Joint Injection Side: Left Indication: Flare of osteoarthritis  Risks explained and consent was given verbally. The site was cleaned with alcohol prep. A needle was introduced with an anterio-lateral approach. Injection given using 2mL of 1% lidocaine without epinephrine and 1mL of kenalog 40mg /ml. This was well tolerated and resulted in symptomatic relief.  Needle was removed, hemostasis achieved, and post injection instructions were explained.   Pt was advised to call or return to clinic if these symptoms worsen or fail to improve as anticipated.   Pertinent previous records reviewed include none pertinent   Follow Up: 3 to 4 weeks for reevaluation.  Could consider advanced imaging if no improvement or worsening of symptoms   Subjective:   I, Judy [name], am serving as a Neurosurgeon for Judy [name]  Chief Complaint: left knee pain   HPI:   05/18/23 Patient is a 72 year old female complaining of left knee pain. Patient states that she was stepping down from a high truck Tuesday, her big dog ran into her Sunday. Celbrex for the pain and that helps, pain isnt horrible but if she flexes she has pain , she states when she tweaks her knee she will have swelling,  does have a brace with her. No radiating pain, posterior knee pain.   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   Current Outpatient Medications:    buPROPion (WELLBUTRIN XL) 300 MG 24 hr tablet, TAKE 1 TABLET BY MOUTH EVERY DAY, Disp: 30 tablet, Rfl: 3   candesartan (ATACAND) 16 MG tablet, Take 1 tablet (16 mg total) by mouth daily., Disp: 90 tablet, Rfl: 1   celecoxib (CELEBREX) 200 MG capsule, TAKE 1 CAPSULE TWICE DAILY AS NEEDED FOR MODERATE PAIN, Disp: 180 capsule, Rfl: 0   Cholecalciferol (VITAMIN D) 1000 UNITS capsule, Take 1,000 Units by mouth daily., Disp: , Rfl:    Coenzyme Q10 200 MG capsule, Take 1 capsule (200 mg total) by mouth daily., Disp: 90 capsule, Rfl: 1   cyclobenzaprine (FLEXERIL) 10 MG tablet, TAKE 1 TABLET BY MOUTH THREE TIMES A DAY AS NEEDED, Disp: 45 tablet, Rfl: 1   famotidine (PEPCID) 20 MG tablet, TAKE 1 TABLET BY MOUTH EVERY DAY, Disp: 30 tablet, Rfl: 0   folic acid (FOLVITE) 1 MG tablet, Take 3 mg by mouth daily., Disp: , Rfl:    inFLIXimab-abda (RENFLEXIS) 100 MG SOLR, Inject 100 mg into the vein every 8 (eight) weeks. Remicaid infusion every 8 weeks for RA, Disp: , Rfl:    meclizine (ANTIVERT) 12.5 MG tablet, Take 1 tablet (12.5 mg total) by mouth 3 (three) times daily as needed for dizziness., Disp: 30 tablet, Rfl: 0   methotrexate 250 MG/10ML injection, Inject 1 mL into the skin once a week., Disp: ,  Rfl:    nystatin-triamcinolone (MYCOLOG II) cream, Apply 1 application topically 2 (two) times daily., Disp: 30 g, Rfl: 1   pantoprazole (PROTONIX) 40 MG tablet, Take 1 tablet (40 mg total) by mouth daily., Disp: 90 tablet, Rfl: 3   doxycycline (VIBRA-TABS) 100 MG tablet, Take 1 tablet (100 mg total) by mouth 2 (two) times daily. (Patient not taking: Reported on 04/15/2023), Disp: 20 tablet, Rfl: 0   Objective:     Vitals:   05/18/23  1021  BP: 118/80  Pulse: 62  SpO2: 97%  Weight: 219 lb (99.3 kg)  Height: 5\' 8"  (1.727 m)      Body mass index is 33.3 kg/m.    Physical Exam:    General:  awake, alert oriented, no acute distress nontoxic Skin: no suspicious lesions or rashes Neuro:sensation intact and strength 5/5 with no deficits, no atrophy, normal muscle tone Psych: No signs of anxiety, depression or other mood disorder  Right knee: Moderate swelling No deformity Positive fluid wave, joint milking ROM Flex 100, Ext 0 TTP medial joint line, medial femoral condyle,  NTTP over the quad tendon,   lat fem condyle, patella, plica, patella tendon, tibial tuberostiy, fibular head, posterior fossa, pes anserine bursa, gerdy's tubercle,  lateral jt line Neg anterior and posterior drawer Neg lachman Neg sag sign Negative varus stress Negative valgus stress   McMurray cause pain, though no palpable pop Unable to perform Thessaly due to pain  Gait antalgic, favoring right leg    Electronically signed by:  Judy [name] [name] 10:59 AM 05/18/23

## 2023-05-18 NOTE — Patient Instructions (Addendum)
Knee HEP  Make continue celebrex as needed 3-4 week follow up

## 2023-06-01 ENCOUNTER — Ambulatory Visit (INDEPENDENT_AMBULATORY_CARE_PROVIDER_SITE_OTHER): Payer: Medicare HMO | Admitting: Medical

## 2023-06-01 VITALS — BP 132/68 | HR 77 | Resp 18 | Ht 68.0 in | Wt 217.8 lb

## 2023-06-01 DIAGNOSIS — K219 Gastro-esophageal reflux disease without esophagitis: Secondary | ICD-10-CM | POA: Diagnosis not present

## 2023-06-01 DIAGNOSIS — R319 Hematuria, unspecified: Secondary | ICD-10-CM | POA: Diagnosis not present

## 2023-06-01 DIAGNOSIS — R102 Pelvic and perineal pain: Secondary | ICD-10-CM

## 2023-06-01 DIAGNOSIS — R1013 Epigastric pain: Secondary | ICD-10-CM

## 2023-06-01 LAB — POC URINALSYSI DIPSTICK (AUTOMATED)
Bilirubin, UA: NEGATIVE
Color, UA: NEGATIVE
Glucose, UA: NEGATIVE
Ketones, UA: NEGATIVE
Leukocytes, UA: NEGATIVE
Nitrite, UA: NEGATIVE
Protein, UA: NEGATIVE
Spec Grav, UA: 1.01 (ref 1.010–1.025)
Urobilinogen, UA: 0.2 E.U./dL
pH, UA: 6 (ref 5.0–8.0)

## 2023-06-01 NOTE — Progress Notes (Signed)
Subjective:    Patient ID: [name], female    DOB: 10/03/1951, 72 y.o.   MRN: [id number]  HPI  Discussed the use of AI scribe software for clinical note transcription with the patient, who gave verbal consent to proceed.  History of Present Illness   The patient, with a history of reflux, presents with worsening heartburn symptoms, occurring both in the morning and evening, despite the use of Protonix and Famotidine. The patient reports needing to take additional doses of these medications, or Pepto-Bismol, multiple times a day to manage the symptoms, which is a change from their previous symptom pattern. The patient had an endoscopy two to three years ago, which showed evidence of sleeve gastrectomy and some redness, but no other significant findings.  In addition to the heartburn, the patient is experiencing discomfort in the bladder area, described as bloating and pain, particularly noticeable at night. The patient reports frequent urination during the night and waking up with pain. The patient denies increased frequency of urination or pain during urination.  The patient also mentions feeling tired and waking up frequently, but denies any other symptoms suggestive of cardiac issues such as shoulder pain, arm pain, jaw pain, or sweating. The patient's concern about the possibility of cardiac issues appears to be based on knowledge gained from work with the [employer], rather than specific symptoms.        Review of Systems  Constitutional:  Negative for chills, fatigue and fever.  Respiratory:  Negative for cough, chest tightness, shortness of breath and wheezing.   Cardiovascular:  Negative for chest pain and palpitations.  Gastrointestinal:  Positive for abdominal pain.       Last felt yesterday mid day.  Genitourinary:  Positive for frequency. Negative for dyspareunia, dysuria, enuresis, urgency and vaginal pain.  Musculoskeletal:  Negative for back pain.  Skin:   Negative for rash.  Neurological:  Negative for dizziness, weakness and numbness.  Hematological:  Negative for adenopathy. Does not bruise/bleed easily.  Psychiatric/Behavioral:  Negative for decreased concentration, dysphoric mood and sleep disturbance.        Objective:   Physical Exam  General Mental Status- Alert. General Appearance- Not in acute distress.   Skin General: Color- Normal Color. Moisture- Normal Moisture.  Neck Carotid Arteries- Normal color. Moisture- Normal Moisture. No carotid bruits. No JVD.  Chest and Lung Exam Auscultation: Breath Sounds:-Normal.  Cardiovascular Auscultation:Rythm- Regular. Murmurs & Other Heart Sounds:Auscultation of the heart reveals- No Murmurs.  Abdomen Inspection:-Inspeection Normal. Palpation/Percussion:Note:No mass. Palpation and Percussion of the abdomen reveal- Non Tender, Non Distended + BS, no rebound or guarding.   Neurologic Cranial Nerve exam:- CN III-XII intact(No nystagmus), symmetric smile. Strength:- 5/5 equal and symmetric strength both upper and lower extremities.       Assessment & Plan:  Assessment and Plan    Gastroesophageal Reflux Disease (GERD): Increased frequency and severity of symptoms despite use of Protonix and Famotidine (continue both). Can add on tums. Previous endoscopy showed redness, but no other significant findings. -Refer back to Gastroenterology for further evaluation. -Order CBC, metabolic panel, lipase, and H. pylori antibody.  Urinary Symptoms: Reports bloating and pain in the bladder area, frequent nocturia and pain on urination. No increase in frequency of urination. -Order urinalysis and urine culture. will wait for culture to come back before treatment unless UA strongly suggestive  Cardiac Concerns: Patient mentioned potential cardiac cause for stomach pain, but denies typical cardiac symptoms such as shoulder pain, arm pain, jaw  pain, or sweating. -Order EKG for comparison  with previous one from December 23. -Advise patient to seek emergency care if symptoms worsen or become more intense.   Follow up  July 8th or 9th or sooner if needed.   Esperanza Richters, PA-C

## 2023-06-01 NOTE — Patient Instructions (Addendum)
Gastroesophageal Reflux Disease (GERD): Increased frequency and severity of symptoms despite use of Protonix and Famotidine (continue both). Can add on tums. Previous endoscopy showed redness, but no other significant findings. -Refer back to Gastroenterology for further evaluation. -Order CBC, metabolic panel, lipase, and H. pylori antibody.  Urinary Symptoms: Reports bloating and pain in the bladder area, frequent nocturia and pain on urination. No increase in frequency of urination. -Order urinalysis and urine culture. will wait for culture to come back before treatment unless UA strongly suggestive  Cardiac Concerns: Patient mentioned potential cardiac cause for stomach pain, but denies typical cardiac symptoms such as shoulder pain, arm pain, jaw pain, or sweating. -Order EKG for comparison with previous one from December 2023. Normal sinus rhythm today. No significant change. -Advise patient to seek emergency care if symptoms worsen or become more intense.   Follow up  July 8th or 9th or sooner if needed.

## 2023-06-01 NOTE — Progress Notes (Deleted)
    Aleen Sells D.[facility] [address] [place] [address] Phone: [phone]   Assessment and Plan:     There are no diagnoses linked to this encounter.  ***   Pertinent previous records reviewed include ***   Follow Up: ***     Subjective:   I, Judy [name], am serving as a Neurosurgeon for Doctor Richardean Sale   Chief Complaint: left knee pain    HPI:    05/18/23 Patient is a 72 year old female complaining of left knee pain. Patient states that she was stepping down from a high truck Tuesday, her big dog ran into her Sunday. Celbrex for the pain and that helps, pain isnt horrible but if she flexes she has pain , she states when she tweaks her knee she will have swelling, does have a brace with her. No radiating pain, posterior knee pain.   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   Current Outpatient Medications:    buPROPion (WELLBUTRIN XL) 300 MG 24 hr tablet, TAKE 1 TABLET BY MOUTH EVERY DAY, Disp: 30 tablet, Rfl: 3   candesartan (ATACAND) 16 MG tablet, Take 1 tablet (16 mg total) by mouth daily., Disp: 90 tablet, Rfl: 1   celecoxib (CELEBREX) 200 MG capsule, TAKE 1 CAPSULE TWICE DAILY AS NEEDED FOR MODERATE PAIN, Disp: 180 capsule, Rfl: 0   Cholecalciferol (VITAMIN D) 1000 UNITS capsule, Take 1,000 Units by mouth daily., Disp: , Rfl:    Coenzyme Q10 200 MG capsule, Take 1 capsule (200 mg total) by mouth daily., Disp: 90 capsule, Rfl: 1   cyclobenzaprine (FLEXERIL) 10 MG tablet, TAKE 1 TABLET BY MOUTH THREE TIMES A DAY AS NEEDED, Disp: 45 tablet, Rfl: 1   doxycycline (VIBRA-TABS) 100 MG tablet, Take 1 tablet (100 mg total) by mouth 2 (two) times daily. (Patient not taking: Reported on 04/15/2023), Disp: 20 tablet, Rfl: 0   famotidine (PEPCID) 20 MG tablet, TAKE 1 TABLET  BY MOUTH EVERY DAY, Disp: 30 tablet, Rfl: 0   folic acid (FOLVITE) 1 MG tablet, Take 3 mg by mouth daily., Disp: , Rfl:    inFLIXimab-abda (RENFLEXIS) 100 MG SOLR, Inject 100 mg into the vein every 8 (eight) weeks. Remicaid infusion every 8 weeks for RA, Disp: , Rfl:    meclizine (ANTIVERT) 12.5 MG tablet, Take 1 tablet (12.5 mg total) by mouth 3 (three) times daily as needed for dizziness., Disp: 30 tablet, Rfl: 0   methotrexate 250 MG/10ML injection, Inject 1 mL into the skin once a week., Disp: , Rfl:    nystatin-triamcinolone (MYCOLOG II) cream, Apply 1 application topically 2 (two) times daily., Disp: 30 g, Rfl: 1   pantoprazole (PROTONIX) 40 MG tablet, Take 1 tablet (40 mg total) by mouth daily., Disp: 90 tablet, Rfl: 3   Objective:     There were no vitals filed for this visit.    There is no height or weight on file to calculate BMI.    Physical Exam:    ***   Electronically signed by:  Aleen Sells D.[facility] 7:25 AM 06/01/23

## 2023-06-02 ENCOUNTER — Encounter: Payer: Self-pay | Admitting: Medical

## 2023-06-02 LAB — URINE CULTURE
MICRO NUMBER:: 15118861
Result:: NO GROWTH
SPECIMEN QUALITY:: ADEQUATE

## 2023-06-02 LAB — CBC WITH DIFFERENTIAL/PLATELET
Basophils Absolute: 0.1 10*3/uL (ref 0.0–0.1)
Basophils Relative: 0.8 % (ref 0.0–3.0)
Eosinophils Absolute: 0.1 10*3/uL (ref 0.0–0.7)
Eosinophils Relative: 0.6 % (ref 0.0–5.0)
HCT: 37.3 % (ref 36.0–46.0)
Hemoglobin: 12 g/dL (ref 12.0–15.0)
Lymphocytes Relative: 15.9 % (ref 12.0–46.0)
Lymphs Abs: 1.4 10*3/uL (ref 0.7–4.0)
MCHC: 32.2 g/dL (ref 30.0–36.0)
MCV: 88.3 fl (ref 78.0–100.0)
Monocytes Absolute: 0.4 10*3/uL (ref 0.1–1.0)
Monocytes Relative: 4.5 % (ref 3.0–12.0)
Neutro Abs: 6.9 10*3/uL (ref 1.4–7.7)
Neutrophils Relative %: 78.2 % — ABNORMAL HIGH (ref 43.0–77.0)
Platelets: 354 10*3/uL (ref 150.0–400.0)
RBC: 4.22 Mil/uL (ref 3.87–5.11)
RDW: 15.1 % (ref 11.5–15.5)
WBC: 8.8 10*3/uL (ref 4.0–10.5)

## 2023-06-02 LAB — COMPREHENSIVE METABOLIC PANEL
ALT: 13 U/L (ref 0–35)
AST: 16 U/L (ref 0–37)
Albumin: 4.1 g/dL (ref 3.5–5.2)
Alkaline Phosphatase: 60 U/L (ref 39–117)
BUN: 26 mg/dL — ABNORMAL HIGH (ref 6–23)
CO2: 29 mEq/L (ref 19–32)
Calcium: 9.7 mg/dL (ref 8.4–10.5)
Chloride: 102 mEq/L (ref 96–112)
Creatinine, Ser: 1.05 mg/dL (ref 0.40–1.20)
GFR: 54.11 mL/min — ABNORMAL LOW (ref >60.00)
Glucose, Bld: 96 mg/dL (ref 70–99)
Potassium: 4.5 mEq/L (ref 3.5–5.1)
Sodium: 138 mEq/L (ref 135–145)
Total Bilirubin: 0.6 mg/dL (ref 0.2–1.2)
Total Protein: 7 g/dL (ref 6.0–8.3)

## 2023-06-02 LAB — H. PYLORI ANTIBODY, IGG: H Pylori IgG: NEGATIVE

## 2023-06-02 LAB — LIPASE: Lipase: 21 U/L (ref 11.0–59.0)

## 2023-06-03 DIAGNOSIS — E782 Mixed hyperlipidemia: Secondary | ICD-10-CM | POA: Diagnosis not present

## 2023-06-04 LAB — LIPID PANEL
Chol/HDL Ratio: 3.6 ratio (ref 0.0–4.4)
Cholesterol, Total: 253 mg/dL — ABNORMAL HIGH (ref 100–199)
HDL: 70 mg/dL (ref >39)
LDL Chol Calc (NIH): 173 mg/dL — ABNORMAL HIGH (ref 0–99)
Triglycerides: 64 mg/dL (ref 0–149)
VLDL Cholesterol Cal: 10 mg/dL (ref 5–40)

## 2023-06-04 NOTE — Addendum Note (Signed)
Addended by: Gwenevere Abbot on: 06/04/2023 10:50 PM   Modules accepted: Orders

## 2023-06-08 ENCOUNTER — Ambulatory Visit (INDEPENDENT_AMBULATORY_CARE_PROVIDER_SITE_OTHER): Payer: Medicare HMO | Admitting: "Endocrinology

## 2023-06-08 ENCOUNTER — Ambulatory Visit: Payer: Medicare HMO | Admitting: Sports Medicine

## 2023-06-08 ENCOUNTER — Encounter: Payer: Self-pay | Admitting: "Endocrinology

## 2023-06-08 VITALS — BP 130/70 | HR 80 | Ht 68.0 in | Wt 220.8 lb

## 2023-06-08 DIAGNOSIS — Z6832 Body mass index (BMI) 32.0-32.9, adult: Secondary | ICD-10-CM | POA: Diagnosis not present

## 2023-06-08 DIAGNOSIS — R7303 Prediabetes: Secondary | ICD-10-CM | POA: Diagnosis not present

## 2023-06-08 DIAGNOSIS — E6609 Other obesity due to excess calories: Secondary | ICD-10-CM | POA: Diagnosis not present

## 2023-06-08 DIAGNOSIS — I10 Essential (primary) hypertension: Secondary | ICD-10-CM

## 2023-06-08 DIAGNOSIS — E782 Mixed hyperlipidemia: Secondary | ICD-10-CM | POA: Diagnosis not present

## 2023-06-08 LAB — POCT GLYCOSYLATED HEMOGLOBIN (HGB A1C): HbA1c, POC (controlled diabetic range): 5.8 % (ref 0.0–7.0)

## 2023-06-08 MED ORDER — ROSUVASTATIN CALCIUM 5 MG PO TABS
5.0000 mg | ORAL_TABLET | Freq: Every day | ORAL | 1 refills | Status: DC
Start: 2023-06-08 — End: 2023-09-18
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
## 2023-06-08 NOTE — Patient Instructions (Signed)

## 2023-06-10 ENCOUNTER — Ambulatory Visit (INDEPENDENT_AMBULATORY_CARE_PROVIDER_SITE_OTHER): Payer: Medicare HMO | Admitting: Urology

## 2023-06-10 ENCOUNTER — Encounter: Payer: Self-pay | Admitting: Urology

## 2023-06-10 VITALS — BP 142/83 | HR 80 | Ht 68.0 in | Wt 218.0 lb

## 2023-06-10 DIAGNOSIS — R319 Hematuria, unspecified: Secondary | ICD-10-CM | POA: Diagnosis not present

## 2023-06-10 DIAGNOSIS — R351 Nocturia: Secondary | ICD-10-CM | POA: Diagnosis not present

## 2023-06-10 DIAGNOSIS — R3129 Other microscopic hematuria: Secondary | ICD-10-CM | POA: Diagnosis not present

## 2023-06-10 LAB — URINALYSIS, ROUTINE W REFLEX MICROSCOPIC
Bilirubin, UA: NEGATIVE
Glucose, UA: NEGATIVE
Ketones, UA: NEGATIVE
Leukocytes,UA: NEGATIVE
Nitrite, UA: NEGATIVE
Protein,UA: NEGATIVE
Specific Gravity, UA: 1.02 (ref 1.005–1.030)
Urobilinogen, Ur: 0.2 mg/dL (ref 0.2–1.0)
pH, UA: 6.5 (ref 5.0–7.5)

## 2023-06-10 LAB — MICROSCOPIC EXAMINATION
Crystal Type: NONE SEEN
Crystals: NONE SEEN
Trichomonas, UA: NONE SEEN
Yeast, UA: NONE SEEN

## 2023-06-10 LAB — BLADDER SCAN AMB NON-IMAGING
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
## 2023-06-11 ENCOUNTER — Other Ambulatory Visit: Payer: Self-pay | Admitting: Medical

## 2023-06-12 ENCOUNTER — Telehealth (HOSPITAL_BASED_OUTPATIENT_CLINIC_OR_DEPARTMENT_OTHER): Payer: Self-pay

## 2023-06-15 ENCOUNTER — Telehealth: Payer: Self-pay | Admitting: Urology

## 2023-06-15 ENCOUNTER — Telehealth (HOSPITAL_BASED_OUTPATIENT_CLINIC_OR_DEPARTMENT_OTHER): Payer: Self-pay

## 2023-06-15 ENCOUNTER — Ambulatory Visit (HOSPITAL_BASED_OUTPATIENT_CLINIC_OR_DEPARTMENT_OTHER): Admission: RE | Admit: 2023-06-15 | Payer: Medicare HMO | Source: Ambulatory Visit

## 2023-06-15 ENCOUNTER — Ambulatory Visit (INDEPENDENT_AMBULATORY_CARE_PROVIDER_SITE_OTHER): Payer: Medicare HMO | Admitting: Sports Medicine

## 2023-06-15 VITALS — BP 132/78 | HR 73 | Ht 68.0 in | Wt 223.0 lb

## 2023-06-15 DIAGNOSIS — M25562 Pain in left knee: Secondary | ICD-10-CM | POA: Diagnosis not present

## 2023-06-15 DIAGNOSIS — M0579 Rheumatoid arthritis with rheumatoid factor of multiple sites without organ or systems involvement: Secondary | ICD-10-CM | POA: Diagnosis not present

## 2023-06-15 DIAGNOSIS — G8929 Other chronic pain: Secondary | ICD-10-CM | POA: Diagnosis not present

## 2023-06-15 DIAGNOSIS — Z6833 Body mass index (BMI) 33.0-33.9, adult: Secondary | ICD-10-CM | POA: Diagnosis not present

## 2023-06-15 DIAGNOSIS — M3501 Sicca syndrome with keratoconjunctivitis: Secondary | ICD-10-CM | POA: Diagnosis not present

## 2023-06-15 DIAGNOSIS — G629 Polyneuropathy, unspecified: Secondary | ICD-10-CM | POA: Diagnosis not present

## 2023-06-15 DIAGNOSIS — Z79899 Other long term (current) drug therapy: Secondary | ICD-10-CM | POA: Diagnosis not present

## 2023-06-15 DIAGNOSIS — M25551 Pain in right hip: Secondary | ICD-10-CM | POA: Diagnosis not present

## 2023-06-15 DIAGNOSIS — R768 Other specified abnormal immunological findings in serum: Secondary | ICD-10-CM | POA: Diagnosis not present

## 2023-06-15 DIAGNOSIS — H209 Unspecified iridocyclitis: Secondary | ICD-10-CM | POA: Diagnosis not present

## 2023-06-15 DIAGNOSIS — M5030 Other cervical disc degeneration, unspecified cervical region: Secondary | ICD-10-CM | POA: Diagnosis not present

## 2023-06-15 DIAGNOSIS — M1712 Unilateral primary osteoarthritis, left knee: Secondary | ICD-10-CM

## 2023-06-15 DIAGNOSIS — H9191 Unspecified hearing loss, right ear: Secondary | ICD-10-CM | POA: Diagnosis not present

## 2023-06-15 NOTE — Telephone Encounter (Signed)
Patient needs [place] order changed to without contrast or needs a script for benadryl so she can have the dye.  [phone]

## 2023-06-15 NOTE — Progress Notes (Signed)
Aleen Sells D.[facility] [address] [place] [address] Phone: [phone]   Assessment and Plan:     1. Chronic pain of left knee 2. Primary osteoarthritis of left knee -Chronic with exacerbation, subsequent visit - Overall significant improvement in knee pain, however patient did have 1 episode of knee giving out followed by 10 minutes of severe knee pain that has resolved by the next day.  This continues to be concerning for intra-articular pathology such as meniscal pathology, though reassuring the patient is overall doing well today with unremarkable physical exam - We will continue conservative therapy with HEP for knee and as needed Celebrex use   Pertinent previous records reviewed include none   Follow Up: As needed if no improvement or worsening of symptoms.  Could consider repeat CSI versus alternative injection.  Could consider advanced imaging.  Of note, patient does have 3 out of country trips planned over the remainder of the calendar year   Subjective:   I, Jerene Canny, am serving as a Neurosurgeon for Doctor Richardean Sale   Chief Complaint: left knee pain    HPI:    05/18/23 Patient is a 72 year old female complaining of left knee pain. Patient states that she was stepping down from a high truck Tuesday, her big dog ran into her Sunday. Celbrex for the pain and that helps, pain isnt horrible but if she flexes she has pain , she states when she tweaks her knee she will have swelling, does have a brace with her. No radiating pain, posterior knee pain.   06/15/2023 Patient states that she is generally good, gave out on her Friday took about  mins for her to walk but issue has resolved    Relevant Historical Information: Hypertension, history of PE not currently on anticoagulation, GERD, rheumatoid arthritis  Additional pertinent review of systems negative.   Current Outpatient Medications:    buPROPion (WELLBUTRIN XL)  300 MG 24 hr tablet, TAKE 1 TABLET BY MOUTH EVERY DAY, Disp: 30 tablet, Rfl: 3   candesartan (ATACAND) 16 MG tablet, Take 1 tablet (16 mg total) by mouth daily., Disp: 90 tablet, Rfl: 1   celecoxib (CELEBREX) 200 MG capsule, TAKE 1 CAPSULE TWICE DAILY AS NEEDED FOR MODERATE PAIN, Disp: 180 capsule, Rfl: 0   Cholecalciferol (VITAMIN D) 1000 UNITS capsule, Take 1,000 Units by mouth daily., Disp: , Rfl:    Coenzyme Q10 200 MG capsule, Take 1 capsule (200 mg total) by mouth daily., Disp: 90 capsule, Rfl: 1   cyclobenzaprine (FLEXERIL) 10 MG tablet, TAKE 1 TABLET BY MOUTH THREE TIMES A DAY AS NEEDED, Disp: 45 tablet, Rfl: 1   famotidine (PEPCID) 20 MG tablet, TAKE 1 TABLET BY MOUTH EVERY DAY, Disp: 30 tablet, Rfl: 0   folic acid (FOLVITE) 1 MG tablet, Take 3 mg by mouth daily., Disp: , Rfl:    inFLIXimab-abda (RENFLEXIS) 100 MG SOLR, Inject 100 mg into the vein every 8 (eight) weeks. Remicaid infusion every 8 weeks for RA, Disp: , Rfl:    meclizine (ANTIVERT) 12.5 MG tablet, Take 1 tablet (12.5 mg total) by mouth 3 (three) times daily as needed for dizziness., Disp: 30 tablet, Rfl: 0   methotrexate 250 MG/10ML injection, Inject 1 mL into the skin once a week., Disp: , Rfl:    nystatin-triamcinolone (MYCOLOG II) cream, Apply 1 application topically 2 (two) times daily., Disp: 30 g, Rfl: 1   pantoprazole (PROTONIX) 40 MG tablet, Take 1 tablet (  40 mg total) by mouth daily., Disp: 90 tablet, Rfl: 3   rosuvastatin (CRESTOR) 5 MG tablet, Take 1 tablet (5 mg total) by mouth daily., Disp: 90 tablet, Rfl: 1   Objective:     Vitals:   06/15/23 1320  BP: 132/78  Pulse: 73  SpO2: 100%  Weight: 223 lb (101.2 kg)  Height: 5\' 8"  (1.727 m)      Body mass index is 33.91 kg/m.    Physical Exam:    General:  awake, alert oriented, no acute distress nontoxic Skin: no suspicious lesions or rashes Neuro:sensation intact and strength 5/5 with no deficits, no atrophy, normal muscle tone Psych: No signs of  anxiety, depression or other mood disorder  Right knee: Mild swelling No deformity Neg fluid wave, joint milking ROM Flex 110, Ext 0 NTTP over the quad tendon, medial fem condyle, lat fem condyle, patella, plica, patella tendon, tibial tuberostiy, fibular head, posterior fossa, pes anserine bursa, gerdy's tubercle, medial jt line, lateral jt line Neg anterior and posterior drawer Neg lachman Neg sag sign Negative varus stress Negative valgus stress Negative McMurray Negative Thessaly  Gait normal    Electronically signed by:  Aleen Sells D.[facility] 1:43 PM 06/15/23

## 2023-06-16 ENCOUNTER — Encounter: Payer: Self-pay | Admitting: Medical

## 2023-06-16 ENCOUNTER — Ambulatory Visit (INDEPENDENT_AMBULATORY_CARE_PROVIDER_SITE_OTHER): Payer: Medicare HMO | Admitting: Medical

## 2023-06-16 ENCOUNTER — Other Ambulatory Visit: Payer: Self-pay | Admitting: Urology

## 2023-06-16 VITALS — BP 130/70 | HR 58 | Temp 98.0°F | Resp 18 | Ht 68.0 in | Wt 221.0 lb

## 2023-06-16 DIAGNOSIS — K219 Gastro-esophageal reflux disease without esophagitis: Secondary | ICD-10-CM

## 2023-06-16 DIAGNOSIS — R319 Hematuria, unspecified: Secondary | ICD-10-CM

## 2023-06-16 DIAGNOSIS — R3129 Other microscopic hematuria: Secondary | ICD-10-CM

## 2023-06-16 NOTE — Progress Notes (Signed)
Patient has IV contrast allergy with SOB and swelling.  Will obtain noncon CT instead of heme protocol.

## 2023-06-16 NOTE — Addendum Note (Signed)
Addended by: Marcha Solders C on: 06/16/2023 10:21 AM   Modules accepted: Orders

## 2023-06-16 NOTE — Patient Instructions (Signed)
Gastroesophageal Reflux Disease (GERD): Persistent symptoms despite Protonix 40mg  daily and occasional famotidine. Bloating and gassiness reported. -Increase Famotidine to 20mg  twice daily consistently for a week. -Continue Protonix 40mg  daily. -Follow-up with GI specialist (appointment currently in October, on cancellation list).  Hematuria: Trace blood in urine. History of allergy to contrast complicating imaging studies. Urologist involved in care. -Await rescheduling of KUB and repeat cystoscopy by urologist.  Cardiac Concerns: Normal EKG, no change in symptoms. -No further action required at this time.   Follow up in 3 months or sooner if needed.
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
## 2023-06-17 ENCOUNTER — Encounter: Payer: Self-pay | Admitting: Urology

## 2023-06-17 ENCOUNTER — Telehealth: Payer: Self-pay | Admitting: Urology

## 2023-06-17 NOTE — Telephone Encounter (Signed)
Imaging needs to know if the CT with w or w/o contrast. And if it is with contrast patient with need medication for her allergy.

## 2023-06-17 NOTE — Telephone Encounter (Signed)
Patient sent a mychart this morning which I responded to that the order had been changed and she would not need to be medicated. I also left a message with Imaging that the order has been changed and they needed to get patient in for a non-contrasted CT.

## 2023-06-18 ENCOUNTER — Ambulatory Visit (HOSPITAL_BASED_OUTPATIENT_CLINIC_OR_DEPARTMENT_OTHER)
Admission: RE | Admit: 2023-06-18 | Discharge: 2023-06-18 | Disposition: A | Discharge status: Home / Self Care | Payer: Medicare HMO | Source: Ambulatory Visit | Attending: Urology | Admitting: Urology

## 2023-06-18 DIAGNOSIS — R3129 Other microscopic hematuria: Secondary | ICD-10-CM | POA: Diagnosis not present

## 2023-06-24 ENCOUNTER — Other Ambulatory Visit: Payer: Self-pay | Admitting: Urology

## 2023-06-24 ENCOUNTER — Telehealth: Payer: Self-pay

## 2023-06-24 MED ORDER — ALPRAZOLAM 1 MG PO TABS: 1.0000 mg | ORAL_TABLET | Freq: Once | ORAL | 0 refills | Status: AC

## 2023-06-24 NOTE — Telephone Encounter (Signed)
Pt called requesting to be medicated prior to cysto as she is very anxious about it.

## 2023-06-26 NOTE — Telephone Encounter (Signed)
Pt aware the med was sent to the pharmacy. She understands that she needs to have a driver.

## 2023-06-30 ENCOUNTER — Other Ambulatory Visit: Payer: Self-pay | Admitting: *Deleted

## 2023-06-30 MED ORDER — CELECOXIB 200 MG PO CAPS: 200.0000 mg | ORAL_CAPSULE | Freq: Two times a day (BID) | ORAL | 0 refills | Status: DC

## 2023-07-01 ENCOUNTER — Ambulatory Visit: Payer: Self-pay | Admitting: Urology

## 2023-07-01 ENCOUNTER — Ambulatory Visit: Payer: Medicare HMO | Admitting: Urology

## 2023-07-01 VITALS — BP 120/79 | HR 70

## 2023-07-01 DIAGNOSIS — N329 Bladder disorder, unspecified: Secondary | ICD-10-CM

## 2023-07-01 DIAGNOSIS — R3129 Other microscopic hematuria: Secondary | ICD-10-CM

## 2023-07-01 DIAGNOSIS — R351 Nocturia: Secondary | ICD-10-CM

## 2023-07-01 MED ORDER — SULFAMETHOXAZOLE-TRIMETHOPRIM 800-160 MG PO TABS
1.0000 | ORAL_TABLET | Freq: Once | ORAL | Status: AC
Start: 2023-07-01 — End: 2023-07-01
  Administered 2023-07-01: 1 via ORAL
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
## 2023-07-01 NOTE — Addendum Note (Signed)
Addended by: Carolin Coy on: 07/01/2023 11:37 AM   Modules accepted: Orders

## 2023-07-06 ENCOUNTER — Encounter: Payer: Self-pay | Admitting: Urology

## 2023-07-07 ENCOUNTER — Encounter: Payer: Self-pay | Admitting: Medical

## 2023-07-09 ENCOUNTER — Other Ambulatory Visit: Payer: Self-pay | Admitting: Medical

## 2023-07-09 DIAGNOSIS — Z79899 Other long term (current) drug therapy: Secondary | ICD-10-CM | POA: Diagnosis not present

## 2023-07-09 DIAGNOSIS — M0579 Rheumatoid arthritis with rheumatoid factor of multiple sites without organ or systems involvement: Secondary | ICD-10-CM | POA: Diagnosis not present

## 2023-07-09 NOTE — Patient Instructions (Addendum)
SURGICAL WAITING ROOM VISITATION  Patients having surgery or a procedure may have no more than 2 support people in the waiting area - these visitors may rotate.    Children under the age of 39 must have an adult with them who is not the patient.  Due to an increase in RSV and influenza rates and associated hospitalizations, children ages 52 and under may not visit patients in [facility] hospitals.  If the patient needs to stay at the hospital during part of their recovery, the visitor guidelines for inpatient rooms apply. Pre-op nurse will coordinate an appropriate time for 1 support person to accompany patient in pre-op.  This support person may not rotate.    Please refer to the [facility] website for the visitor guidelines for Inpatients (after your surgery is over and you are in a regular room).       Your procedure is scheduled on: 07/22/23   Report to [facility] Main Entrance    Report to admitting at 11:45 AM   Call this number if you have problems the morning of surgery [phone]   Do not eat food or drink liquids:After Midnight.          Oral Hygiene is also important to reduce your risk of infection.                                    Remember - BRUSH YOUR TEETH THE MORNING OF SURGERY WITH YOUR REGULAR TOOTHPASTE   Stop all vitamins and herbal supplements 7 days before surgery.   Take these medicines the morning of surgery with A SIP OF WATER:  Bupropion, famotidine, Pantoprazole, Rosuvastatin  Bring CPAP mask and tubing day of surgery.                              You may not have any metal on your body including hair pins, jewelry, and body piercing             Do not wear make-up, lotions, powders, perfumes, or deodorant  Do not wear nail polish including gel and S&S, artificial/acrylic nails, or any other type of covering on natural nails including finger and toenails. If you have artificial nails, gel coating, etc. that needs to be removed by a  nail salon please have this removed prior to surgery or surgery may need to be canceled/ delayed if the surgeon/ anesthesia feels like they are unable to be safely monitored.   Do not shave  48 hours prior to surgery.    Do not bring valuables to the hospital. [place] IS NOT             RESPONSIBLE   FOR VALUABLES.   Contacts, glasses, dentures or bridgework may not be worn into surgery.  DO NOT BRING YOUR HOME MEDICATIONS TO THE HOSPITAL. PHARMACY WILL DISPENSE MEDICATIONS LISTED ON YOUR MEDICATION LIST TO YOU DURING YOUR ADMISSION IN THE HOSPITAL!    Patients discharged on the day of surgery will not be allowed to drive home.  Someone NEEDS to stay with you for the first 24 hours after anesthesia.   Special Instructions: Bring a copy of your healthcare power of attorney and living will documents the day of surgery if you haven't scanned them before.              Please read  over the following fact sheets you were given: IF YOU HAVE QUESTIONS ABOUT YOUR PRE-OP INSTRUCTIONS PLEASE CALL [phone] Rosey Bath   If you received a COVID test during your pre-op visit  it is requested that you wear a mask when out in public, stay away from anyone that may not be feeling well and notify your surgeon if you develop symptoms. If you test positive for Covid or have been in contact with anyone that has tested positive in the last 10 days please notify you surgeon.    [place] - Preparing for Surgery Before surgery, you can play an important role.  Because skin is not sterile, your skin needs to be as free of germs as possible.  You can reduce the number of germs on your skin by washing with CHG (chlorahexidine gluconate) soap before surgery.  CHG is an antiseptic cleaner which kills germs and bonds with the skin to continue killing germs even after washing. Please DO NOT use if you have an allergy to CHG or antibacterial soaps.  If your skin becomes reddened/irritated stop using the CHG and inform  your nurse when you arrive at Short Stay. Do not shave (including legs and underarms) for at least 48 hours prior to the first CHG shower.  You may shave your face/neck.  Please follow these instructions carefully:  1.  Shower with CHG Soap the night before surgery and the  morning of surgery.  2.  If you choose to wash your hair, wash your hair first as usual with your normal  shampoo.  3.  After you shampoo, rinse your hair and body thoroughly to remove the shampoo.                             4.  Use CHG as you would any other liquid soap.  You can apply chg directly to the skin and wash.  Gently with a scrungie or clean washcloth.  5.  Apply the CHG Soap to your body ONLY FROM THE NECK DOWN.   Do   not use on face/ open                           Wound or open sores. Avoid contact with eyes, ears mouth and   genitals (private parts).                       Wash face,  Genitals (private parts) with your normal soap.             6.  Wash thoroughly, paying special attention to the area where your    surgery  will be performed.  7.  Thoroughly rinse your body with warm water from the neck down.  8.  DO NOT shower/wash with your normal soap after using and rinsing off the CHG Soap.                9.  Pat yourself dry with a clean towel.            10.  Wear clean pajamas.            11.  Place clean sheets on your bed the night of your first shower and do not  sleep with pets. Day of Surgery : Do not apply any lotions/deodorants the morning of surgery.  Please wear clean clothes to the hospital/surgery center.  FAILURE  TO FOLLOW THESE INSTRUCTIONS MAY RESULT IN THE CANCELLATION OF YOUR SURGERY  PATIENT SIGNATURE_________________________________  NURSE SIGNATURE__________________________________  ________________________________________________________________________

## 2023-07-09 NOTE — Progress Notes (Addendum)
COVID Vaccine received:  []  No [x]  Yes Date of any COVID positive Test in last 90 days: No PCP - Esperanza Richters PA-C Cardiologist -   Chest x-ray - 12/03/22  EPIC EKG - 06/01/23 EPIC  Stress Test -  ECHO - 10/12/20 EPIC Cardiac Cath -   Bowel Prep - [x]  No  []   Yes ______  Pacemaker / ICD device [x]  No []  Yes   Spinal Cord Stimulator:[x]  No []  Yes       History of Sleep Apnea? [x]  No []  Yes   CPAP used?- [x]  No []  Yes    Does the patient monitor blood sugar?          [x]  No []  Yes  []  N/A  Patient has: [x]  NO Hx DM   []  Pre-DM                 []  DM1  []   DM2 Does patient have a [employer] or [employer]? []  No []  Yes   Fasting Blood Sugar Ranges-  Checks Blood Sugar _____ times a day  GLP1 agonist / usual dose - No GLP1 instructions:  SGLT-2 inhibitors / usual dose - No SGLT-2 instructions:   Blood Thinner / Instructions:No Aspirin Instructions:No  Comments:   Activity level: Patient is able to climb a flight of stairs without difficulty; [x]  No CP  [x]  No SOB, but would have ___   Patient can perform ADLs without assistance.   Anesthesia review: HTN, OSA, Anemia, RA,   Patient denies shortness of breath, fever, cough and chest pain at PAT appointment.  Patient verbalized understanding and agreement to the Pre-Surgical Instructions that were given to them at this PAT appointment. Patient was also educated of the need to review these PAT instructions again prior to his/her surgery.I reviewed the appropriate phone numbers to call if they have any and questions or concerns.

## 2023-07-10 ENCOUNTER — Encounter (HOSPITAL_COMMUNITY)
Admission: RE | Admit: 2023-07-10 | Discharge: 2023-07-10 | Disposition: A | Discharge status: Home / Self Care | Payer: Medicare HMO | Source: Ambulatory Visit | Attending: Urology | Admitting: Urology

## 2023-07-10 ENCOUNTER — Encounter (HOSPITAL_COMMUNITY): Payer: Self-pay

## 2023-07-10 ENCOUNTER — Other Ambulatory Visit: Payer: Self-pay

## 2023-07-10 VITALS — BP 139/55 | HR 77 | Temp 98.2°F | Resp 16 | Ht 68.0 in | Wt 217.0 lb

## 2023-07-10 DIAGNOSIS — Z01812 Encounter for preprocedural laboratory examination: Secondary | ICD-10-CM | POA: Diagnosis not present

## 2023-07-10 DIAGNOSIS — I10 Essential (primary) hypertension: Secondary | ICD-10-CM | POA: Diagnosis not present

## 2023-07-10 LAB — CBC
HCT: 37.5 % (ref 36.0–46.0)
Hemoglobin: 11.8 g/dL — ABNORMAL LOW (ref 12.0–15.0)
MCH: 28 pg (ref 26.0–34.0)
MCHC: 31.5 g/dL (ref 30.0–36.0)
MCV: 89.1 fL (ref 80.0–100.0)
Platelets: 342 10*3/uL (ref 150–400)
RBC: 4.21 MIL/uL (ref 3.87–5.11)
RDW: 14.4 % (ref 11.5–15.5)
WBC: 6 10*3/uL (ref 4.0–10.5)
nRBC: 0 % (ref 0.0–0.2)

## 2023-07-10 LAB — LAB REPORT - SCANNED: EGFR: 36

## 2023-07-10 LAB — BASIC METABOLIC PANEL
Anion gap: 11 (ref 5–15)
BUN: 27 mg/dL — ABNORMAL HIGH (ref 8–23)
CO2: 23 mmol/L (ref 22–32)
Calcium: 8.9 mg/dL (ref 8.9–10.3)
Chloride: 103 mmol/L (ref 98–111)
Creatinine, Ser: 1.35 mg/dL — ABNORMAL HIGH (ref 0.44–1.00)
GFR, Estimated: 43 mL/min — ABNORMAL LOW (ref >60)
Glucose, Bld: 112 mg/dL — ABNORMAL HIGH (ref 70–99)
Potassium: 4 mmol/L (ref 3.5–5.1)
Sodium: 137 mmol/L (ref 135–145)

## 2023-07-20 ENCOUNTER — Encounter: Payer: Self-pay | Admitting: "Endocrinology

## 2023-07-22 ENCOUNTER — Ambulatory Visit (HOSPITAL_COMMUNITY)
Admission: RE | Admit: 2023-07-22 | Discharge: 2023-07-22 | Disposition: A | Discharge status: Home / Self Care | Payer: Medicare HMO | Source: Ambulatory Visit | Attending: Urology | Admitting: Urology

## 2023-07-22 ENCOUNTER — Ambulatory Visit (HOSPITAL_BASED_OUTPATIENT_CLINIC_OR_DEPARTMENT_OTHER): Payer: Medicare HMO | Admitting: Certified Registered Nurse Anesthetist

## 2023-07-22 ENCOUNTER — Encounter (HOSPITAL_COMMUNITY): Payer: Self-pay | Admitting: Urology

## 2023-07-22 ENCOUNTER — Ambulatory Visit (HOSPITAL_COMMUNITY): Payer: Medicare HMO

## 2023-07-22 ENCOUNTER — Ambulatory Visit (HOSPITAL_COMMUNITY): Payer: Medicare HMO | Admitting: Certified Registered Nurse Anesthetist

## 2023-07-22 ENCOUNTER — Encounter (HOSPITAL_COMMUNITY)
Admission: RE | Disposition: A | Discharge status: Home / Self Care | Payer: Self-pay | Source: Ambulatory Visit | Attending: Urology

## 2023-07-22 DIAGNOSIS — N3289 Other specified disorders of bladder: Secondary | ICD-10-CM | POA: Diagnosis not present

## 2023-07-22 DIAGNOSIS — R351 Nocturia: Secondary | ICD-10-CM | POA: Diagnosis not present

## 2023-07-22 DIAGNOSIS — R3129 Other microscopic hematuria: Secondary | ICD-10-CM | POA: Diagnosis not present

## 2023-07-22 DIAGNOSIS — N329 Bladder disorder, unspecified: Secondary | ICD-10-CM | POA: Insufficient documentation

## 2023-07-22 HISTORY — PX: CYSTOSCOPY W/ RETROGRADES: SHX1426

## 2023-07-22 HISTORY — PX: TRANSURETHRAL RESECTION OF BLADDER TUMOR: SHX2575

## 2023-07-22 SURGERY — TURBT (TRANSURETHRAL RESECTION OF BLADDER TUMOR)
Anesthesia: General | Laterality: Bilateral

## 2023-07-22 MED ORDER — FENTANYL CITRATE (PF) 100 MCG/2ML IJ SOLN
INTRAMUSCULAR | Status: AC
  Filled 2023-07-22: qty 2

## 2023-07-22 MED ORDER — CHLORHEXIDINE GLUCONATE 0.12 % MT SOLN
15.0000 mL | Freq: Once | OROMUCOSAL | Status: AC
  Administered 2023-07-22: 15 mL via OROMUCOSAL

## 2023-07-22 MED ORDER — FENTANYL CITRATE PF 50 MCG/ML IJ SOSY: 25.0000 ug | PREFILLED_SYRINGE | INTRAMUSCULAR | Status: DC | PRN

## 2023-07-22 MED ORDER — CEFAZOLIN SODIUM-DEXTROSE 2-4 GM/100ML-% IV SOLN
2.0000 g | INTRAVENOUS | Status: AC
  Administered 2023-07-22: 2 g via INTRAVENOUS
  Filled 2023-07-22: qty 100

## 2023-07-22 MED ORDER — DROPERIDOL 2.5 MG/ML IJ SOLN: 0.6250 mg | Freq: Once | INTRAMUSCULAR | Status: DC | PRN

## 2023-07-22 MED ORDER — DEXAMETHASONE SODIUM PHOSPHATE 10 MG/ML IJ SOLN
INTRAMUSCULAR | Status: AC
  Filled 2023-07-22: qty 1

## 2023-07-22 MED ORDER — ORAL CARE MOUTH RINSE: 15.0000 mL | Freq: Once | OROMUCOSAL | Status: AC

## 2023-07-22 MED ORDER — ACETAMINOPHEN 10 MG/ML IV SOLN: 1000.0000 mg | Freq: Once | INTRAVENOUS | Status: DC | PRN

## 2023-07-22 MED ORDER — STERILE WATER FOR IRRIGATION IR SOLN
Status: DC | PRN
Start: 2023-07-22 — End: 2023-07-22
  Administered 2023-07-22: 3000 mL

## 2023-07-22 MED ORDER — FENTANYL CITRATE (PF) 100 MCG/2ML IJ SOLN
INTRAMUSCULAR | Status: DC | PRN
  Administered 2023-07-22 (×2): 50 ug via INTRAVENOUS

## 2023-07-22 MED ORDER — MIDAZOLAM HCL 5 MG/5ML IJ SOLN
INTRAMUSCULAR | Status: DC | PRN
  Administered 2023-07-22: 2 mg via INTRAVENOUS

## 2023-07-22 MED ORDER — ONDANSETRON HCL 4 MG/2ML IJ SOLN
INTRAMUSCULAR | Status: AC
  Filled 2023-07-22: qty 2

## 2023-07-22 MED ORDER — DEXAMETHASONE SODIUM PHOSPHATE 4 MG/ML IJ SOLN
INTRAMUSCULAR | Status: DC | PRN
  Administered 2023-07-22: 5 mg via INTRAVENOUS

## 2023-07-22 MED ORDER — LIDOCAINE HCL URETHRAL/MUCOSAL 2 % EX GEL
CUTANEOUS | Status: DC | PRN
  Administered 2023-07-22: 1 via URETHRAL

## 2023-07-22 MED ORDER — LIDOCAINE HCL URETHRAL/MUCOSAL 2 % EX GEL
CUTANEOUS | Status: AC
  Filled 2023-07-22: qty 30

## 2023-07-22 MED ORDER — MIDAZOLAM HCL 2 MG/2ML IJ SOLN
INTRAMUSCULAR | Status: AC
  Filled 2023-07-22: qty 2

## 2023-07-22 MED ORDER — PROMETHAZINE HCL 25 MG/ML IJ SOLN: 6.2500 mg | INTRAMUSCULAR | Status: DC | PRN

## 2023-07-22 MED ORDER — ACETAMINOPHEN 325 MG PO TABS: 325.0000 mg | ORAL_TABLET | ORAL | Status: DC | PRN

## 2023-07-22 MED ORDER — LIDOCAINE HCL (CARDIAC) PF 100 MG/5ML IV SOSY
PREFILLED_SYRINGE | INTRAVENOUS | Status: DC | PRN
  Administered 2023-07-22: 60 mg via INTRAVENOUS

## 2023-07-22 MED ORDER — ONDANSETRON HCL 4 MG/2ML IJ SOLN
INTRAMUSCULAR | Status: DC | PRN
  Administered 2023-07-22: 4 mg via INTRAVENOUS

## 2023-07-22 MED ORDER — PROPOFOL 10 MG/ML IV BOLUS
INTRAVENOUS | Status: DC | PRN
Start: 2023-07-22 — End: 2023-07-22
  Administered 2023-07-22: 200 mg via INTRAVENOUS

## 2023-07-22 MED ORDER — LACTATED RINGERS IV SOLN: INTRAVENOUS | Status: DC

## 2023-07-22 MED ORDER — OXYCODONE HCL 5 MG PO TABS: 5.0000 mg | ORAL_TABLET | Freq: Once | ORAL | Status: DC | PRN

## 2023-07-22 MED ORDER — LIDOCAINE HCL (PF) 2 % IJ SOLN
INTRAMUSCULAR | Status: AC
  Filled 2023-07-22: qty 5

## 2023-07-22 MED ORDER — IOHEXOL 300 MG/ML  SOLN
INTRAMUSCULAR | Status: DC | PRN
  Administered 2023-07-22: 15 mL via URETHRAL

## 2023-07-22 MED ORDER — ACETAMINOPHEN 160 MG/5ML PO SOLN: 325.0000 mg | ORAL | Status: DC | PRN

## 2023-07-22 MED ORDER — OXYCODONE HCL 5 MG/5ML PO SOLN: 5.0000 mg | Freq: Once | ORAL | Status: DC | PRN

## 2023-07-22 SURGICAL SUPPLY — 23 items
BAG DRN RND TRDRP ANRFLXCHMBR (UROLOGICAL SUPPLIES)
BAG URINE DRAIN 2000ML AR STRL (UROLOGICAL SUPPLIES) IMPLANT
BAG URO CATCHER STRL LF (MISCELLANEOUS) ×1 IMPLANT
CATH URETL OPEN END 6FR 70 (CATHETERS) IMPLANT
CLOTH BEACON ORANGE TIMEOUT ST (SAFETY) ×1 IMPLANT
DRAPE FOOT SWITCH (DRAPES) ×1 IMPLANT
ELECT REM PT RETURN 15FT ADLT (MISCELLANEOUS) ×1 IMPLANT
EVACUATOR MICROVAS BLADDER (UROLOGICAL SUPPLIES) IMPLANT
GLOVE BIO SURGEON STRL SZ8.5 (GLOVE) ×1 IMPLANT
GLOVE SURG LX STRL 7.5 STRW (GLOVE) ×1 IMPLANT
GOWN STRL REUS W/ TWL XL LVL3 (GOWN DISPOSABLE) ×1 IMPLANT
GOWN STRL REUS W/TWL XL LVL3 (GOWN DISPOSABLE) ×1
GUIDEWIRE STR DUAL SENSOR (WIRE) ×1 IMPLANT
KIT TURNOVER KIT A (KITS) IMPLANT
LOOP CUT BIPOLAR 24F LRG (ELECTROSURGICAL) IMPLANT
MANIFOLD NEPTUNE II (INSTRUMENTS) ×1 IMPLANT
NS IRRIG 1000ML POUR BTL (IV SOLUTION) IMPLANT
PACK CYSTO (CUSTOM PROCEDURE TRAY) ×1 IMPLANT
PLUG CATH AND CAP STRL 200 (CATHETERS) ×1 IMPLANT
SYR TOOMEY IRRIG 70ML (MISCELLANEOUS)
SYRINGE TOOMEY IRRIG 70ML (MISCELLANEOUS) IMPLANT
TUBING CONNECTING 10 (TUBING) ×1 IMPLANT
TUBING UROLOGY SET (TUBING) ×1 IMPLANT

## 2023-07-22 NOTE — Anesthesia Procedure Notes (Signed)
Procedure Name: LMA Insertion Date/Time: 07/22/2023 2:56 PM  Performed by: Vanessa Treasure, CRNAPre-anesthesia Checklist: Emergency Drugs available, Patient identified, Suction available and Patient being monitored Patient Re-evaluated:Patient Re-evaluated prior to induction Oxygen Delivery Method: Circle system utilized Preoxygenation: Pre-oxygenation with 100% oxygen Induction Type: IV induction Ventilation: Mask ventilation without difficulty LMA: LMA inserted LMA Size: 4.0 Number of attempts: 1 Placement Confirmation: positive ETCO2 and breath sounds checked- equal and bilateral Tube secured with: Tape Dental Injury: Teeth and Oropharynx as per pre-operative assessment

## 2023-07-22 NOTE — Transfer of Care (Signed)
Immediate Anesthesia Transfer of Care Note  Patient: Judy [name]  Procedure(s) Performed: BLADDER BIOPSIES AND FULGU RATION (Bilateral) CYSTOSCOPY WITH RETROGRADE PYELOGRAM (Bilateral)  Patient Location: PACU  Anesthesia Type:General  Level of Consciousness: awake and patient cooperative  Airway & Oxygen Therapy: Patient Spontanous Breathing and Patient connected to face mask  Post-op Assessment: Report given to RN and Post -op Vital signs reviewed and stable  Post vital signs: Reviewed and stable  Last Vitals:  Vitals Value Taken Time  BP 153/76 07/22/23 1535  Temp    Pulse 80 07/22/23 1536  Resp 13 07/22/23 1536  SpO2 100 % 07/22/23 1536  Vitals shown include unfiled device data.  Last Pain:  Vitals:   07/22/23 1215  TempSrc: Oral  PainSc: 0-No pain         Complications: No notable events documented.

## 2023-07-22 NOTE — Interval H&P Note (Signed)
History and Physical Interval Note:  07/22/2023 12:55 PM  Judy [name]  has presented today for surgery, with the diagnosis of microhemturia; bladder lesion on cystoscopy.  The various methods of treatment have been discussed with the patient and family. After consideration of risks, benefits and other options for treatment, the patient has consented to  Procedure(s) with comments: TRANSURETHRAL RESECTION OF BLADDER TUMOR (TURBT) (Bilateral) - total time 60 minutes CYSTOSCOPY WITH RETROGRADE PYELOGRAM (Bilateral) as a surgical intervention.  The patient's history has been reviewed, patient examined, no change in status, stable for surgery.  I have reviewed the patient's chart and labs.  Questions were answered to the patient's satisfaction.     [name]

## 2023-07-22 NOTE — Op Note (Addendum)
OPERATIVE NOTE   Patient Name: Judy [name]  MRN: [id number]   Date of Procedure: 07/22/2023   Preoperative diagnosis: Microscopic hematuria, bladder lesion  Postoperative diagnosis:  Same  Procedure:  Cystoscopy, bilateral retrograde ureteropyelogram's with fluoroscopy with intraoperative interpretation; bladder biopsy with fulguration  Attending:  Concepcion Living, MD  Anesthesia: General Via LMA  Estimated blood loss: Minimal  Fluids: Per anesthesia record  Drains: None  Specimens: Bladder biopsies  Antibiotics: Ancef 2 g IV  Findings: Small approximately 1cm raised erythematous lesion right lateral bladder wall as well as a 1cm similar appearing lesion along the intramural ureter proximal and lateral to the left ureteral orifice.  Normal bilateral retrograde ureteropyelogram's  Indications:  Hematuria, bladder lesion  Description of Procedure:  The patient was brought to the operating room where she was correctly identified.  She then underwent satisfactory induction of general anesthesia via LMA.  She was then positioned in the modified dorsal lithotomy position and prepped and draped in the usual sterile fashion.  31 French panendoscope was subsequently inserted per urethra and the bladder was carefully inspected.  The ureteral openings appeared normal.  On careful inspection of the entire urothelium there was noted to be a raised erythematous lesion on the trigone along the course of the intramural ureter proximal and lateral to the left ureteral orifice.  There is also noted to be a small raised lesion on the right lateral bladder wall.  These were both quite small (approximately 1cm) but appeared like they would bleed easily.  That it were not typical for TCC.  Bilateral retrograde ureteropyelogram's were subsequently performed with an endhole catheter.  On retrograde injection bilaterally there was a normal ureter as well as intrarenal collecting system without  evidence of dilation or fixed filling defects.  The above noted abnormal bladder regions were subsequently biopsied with the flexible biopsy forceps.  The specimens were sent for pathologic analysis.  I used the [place]bee on fulguration current and judiciously obtained hemostasis of the biopsy sites.  The procedure was well-tolerated.  The bladder was drained.  The patient was administered 2% lidocaine jelly per urethra and the procedure terminated.  Complications: None  Condition: Stable, extubated, transferred to PACU  Plan: FU 2 weeks

## 2023-07-22 NOTE — Anesthesia Preprocedure Evaluation (Signed)
Anesthesia Evaluation  Patient identified by MRN, date of birth, ID band Patient awake    Reviewed: Allergy & Precautions, NPO status , Patient's Chart, lab work & pertinent test results  Airway Mallampati: II  TM Distance: >3 FB Neck ROM: Full    Dental  (+) Teeth Intact, Dental Advisory Given   Pulmonary sleep apnea    breath sounds clear to auscultation       Cardiovascular hypertension,  Rhythm:Regular Rate:Normal     Neuro/Psych  PSYCHIATRIC DISORDERS Anxiety Depression     Neuromuscular disease    GI/Hepatic Neg liver ROS,GERD  ,,  Endo/Other  negative endocrine ROS    Renal/GU negative Renal ROS     Musculoskeletal  (+) Arthritis , Rheumatoid disorders,    Abdominal   Peds  Hematology  (+) Blood dyscrasia, Sickle cell trait and anemia   Anesthesia Other Findings   Reproductive/Obstetrics                             Anesthesia Physical Anesthesia Plan  ASA: 2  Anesthesia Plan: General   Post-op Pain Management: Tylenol PO (pre-op)*   Induction: Intravenous  PONV Risk Score and Plan: 4 or greater and Ondansetron, Dexamethasone, Midazolam and Scopolamine patch - Pre-op  Airway Management Planned: LMA  Additional Equipment: None  Intra-op Plan:   Post-operative Plan: Extubation in OR  Informed Consent: I have reviewed the patients History and Physical, chart, labs and discussed the procedure including the risks, benefits and alternatives for the proposed anesthesia with the patient or authorized representative who has indicated his/her understanding and acceptance.     Dental advisory given  Plan Discussed with: CRNA  Anesthesia Plan Comments:         Anesthesia Quick Evaluation

## 2023-07-23 ENCOUNTER — Encounter (HOSPITAL_COMMUNITY): Payer: Self-pay | Admitting: Urology

## 2023-07-23 NOTE — Anesthesia Postprocedure Evaluation (Signed)
Anesthesia Post Note  Patient: Judy [name]  Procedure(s) Performed: BLADDER BIOPSIES AND FULGU RATION (Bilateral) CYSTOSCOPY WITH RETROGRADE PYELOGRAM (Bilateral)     Patient location during evaluation: PACU Anesthesia Type: General Level of consciousness: awake and alert Pain management: pain level controlled Vital Signs Assessment: post-procedure vital signs reviewed and stable Respiratory status: spontaneous breathing, nonlabored ventilation, respiratory function stable and patient connected to nasal cannula oxygen Cardiovascular status: blood pressure returned to baseline and stable Postop Assessment: no apparent nausea or vomiting Anesthetic complications: no   No notable events documented.               Shelton Silvas

## 2023-07-24 LAB — SURGICAL PATHOLOGY

## 2023-08-06 ENCOUNTER — Encounter: Payer: Self-pay | Admitting: Urology

## 2023-08-06 ENCOUNTER — Ambulatory Visit (INDEPENDENT_AMBULATORY_CARE_PROVIDER_SITE_OTHER): Payer: Medicare HMO | Admitting: Urology

## 2023-08-06 VITALS — BP 125/82 | HR 75

## 2023-08-06 DIAGNOSIS — N3289 Other specified disorders of bladder: Secondary | ICD-10-CM | POA: Diagnosis not present

## 2023-08-06 DIAGNOSIS — N329 Bladder disorder, unspecified: Secondary | ICD-10-CM | POA: Diagnosis not present

## 2023-08-06 DIAGNOSIS — Q6470 Unspecified congenital malformation of bladder and urethra: Secondary | ICD-10-CM
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
## 2023-08-15 ENCOUNTER — Other Ambulatory Visit: Payer: Self-pay | Admitting: Medical

## 2023-08-17 LAB — URINALYSIS, ROUTINE W REFLEX MICROSCOPIC
Bilirubin, UA: NEGATIVE
Glucose, UA: NEGATIVE
Ketones, UA: NEGATIVE
Leukocytes,UA: NEGATIVE
Nitrite, UA: NEGATIVE
Protein,UA: NEGATIVE
Specific Gravity, UA: 1.015 (ref 1.005–1.030)
Urobilinogen, Ur: 0.2 mg/dL (ref 0.2–1.0)
pH, UA: 6.5 (ref 5.0–7.5)

## 2023-08-29 ENCOUNTER — Other Ambulatory Visit: Payer: Self-pay | Admitting: Medical

## 2023-08-29 DIAGNOSIS — I10 Essential (primary) hypertension: Secondary | ICD-10-CM

## 2023-09-07 ENCOUNTER — Telehealth: Payer: Self-pay | Admitting: Medical

## 2023-09-07 NOTE — Telephone Encounter (Signed)
Copied from CRM [id number]. Topic: Medicare AWV >> Sep 07, 2023  3:12 PM Payton Doughty wrote: Reason for CRM: Called LM 09/07/2023 to schedule AWV   Verlee Rossetti; Care Guide Ambulatory Clinical Support [facility] Direct Dial: [phone]

## 2023-09-10 DIAGNOSIS — Z79899 Other long term (current) drug therapy: Secondary | ICD-10-CM | POA: Diagnosis not present

## 2023-09-10 DIAGNOSIS — Z111 Encounter for screening for respiratory tuberculosis: Secondary | ICD-10-CM | POA: Diagnosis not present

## 2023-09-10 DIAGNOSIS — M0579 Rheumatoid arthritis with rheumatoid factor of multiple sites without organ or systems involvement: Secondary | ICD-10-CM | POA: Diagnosis not present

## 2023-09-10 DIAGNOSIS — R5383 Other fatigue: Secondary | ICD-10-CM | POA: Diagnosis not present

## 2023-09-14 ENCOUNTER — Encounter: Payer: Self-pay | Admitting: Medical

## 2023-09-14 ENCOUNTER — Ambulatory Visit (INDEPENDENT_AMBULATORY_CARE_PROVIDER_SITE_OTHER): Payer: Medicare HMO | Admitting: Gastroenterology

## 2023-09-14 ENCOUNTER — Encounter: Payer: Self-pay | Admitting: Gastroenterology

## 2023-09-14 VITALS — BP 144/82 | HR 72 | Ht 66.75 in | Wt 223.0 lb

## 2023-09-14 DIAGNOSIS — R110 Nausea: Secondary | ICD-10-CM | POA: Diagnosis not present

## 2023-09-14 DIAGNOSIS — Z903 Acquired absence of stomach [part of]: Secondary | ICD-10-CM

## 2023-09-14 DIAGNOSIS — K219 Gastro-esophageal reflux disease without esophagitis: Secondary | ICD-10-CM

## 2023-09-14 MED ORDER — PANTOPRAZOLE SODIUM 40 MG PO TBEC: 40.0000 mg | DELAYED_RELEASE_TABLET | Freq: Two times a day (BID) | ORAL | 3 refills | Status: DC

## 2023-09-14 NOTE — Patient Instructions (Signed)
_______________________________________________________  If your blood pressure at your visit was 140/90 or greater, please contact your primary care physician to follow up on this.  _______________________________________________________  If you are age 70 or older, your body mass index should be between 23-30. Your Body mass index is 35.19 kg/m. If this is out of the aforementioned range listed, please consider follow up with your Primary Care Provider.  If you are age 70 or younger, your body mass index should be between 19-25. Your Body mass index is 35.19 kg/m. If this is out of the aformentioned range listed, please consider follow up with your Primary Care Provider.   ________________________________________________________  The [facility] providers would like to encourage you to use [facility] to communicate with providers for non-urgent requests or questions.  Due to long hold times on the telephone, sending your provider a message by [facility] may be a faster and more efficient way to get a response.  Please allow 48 business hours for a response.  Please remember that this is for non-urgent requests.  _______________________________________________________  We have sent the following medications to your pharmacy for you to pick up at your convenience: Protonix 40mg  2 times a day  Minimize Celebrex  You have been scheduled for an Upper GI Series at [facility]. Your appointment is on Monday 09-21-2023 at 10am. Please arrive 30 minutes prior to your test for registration. Make sure not to eat or drink anything after midnight on the night before your test. If you need to reschedule, please call radiology at [phone]. ________________________________________________________________ An upper GI series uses x rays to help diagnose problems of the upper GI tract, which includes the esophagus, stomach, and duodenum. The duodenum is the first part of the small intestine. An upper  GI series is conducted by a radiology technologist or a radiologist--a doctor who specializes in x-ray imaging--at a hospital or outpatient center. While sitting or standing in front of an x-ray machine, the patient drinks barium liquid, which is often white and has a chalky consistency and taste. The barium liquid coats the lining of the upper GI tract and makes signs of disease show up more clearly on x rays. X-ray video, called fluoroscopy, is used to view the barium liquid moving through the esophagus, stomach, and duodenum. Additional x rays and fluoroscopy are performed while the patient lies on an x-ray table. To fully coat the upper GI tract with barium liquid, the technologist or radiologist may press on the abdomen or ask the patient to change position. Patients hold still in various positions, allowing the technologist or radiologist to take x rays of the upper GI tract at different angles. If a technologist conducts the upper GI series, a radiologist will later examine the images to look for problems.  This test typically takes about 1 hour to complete. __________________________________________________________________  Thank you,  Dr. Lynann Bologna
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
## 2023-09-15 ENCOUNTER — Other Ambulatory Visit: Payer: Self-pay | Admitting: Medical

## 2023-09-17 ENCOUNTER — Other Ambulatory Visit: Payer: Self-pay | Admitting: "Endocrinology

## 2023-09-17 ENCOUNTER — Encounter: Payer: Self-pay | Admitting: Gastroenterology

## 2023-09-21 ENCOUNTER — Other Ambulatory Visit (HOSPITAL_COMMUNITY): Payer: Medicare HMO

## 2023-10-07 DIAGNOSIS — Z6833 Body mass index (BMI) 33.0-33.9, adult: Secondary | ICD-10-CM | POA: Diagnosis not present

## 2023-10-07 DIAGNOSIS — R768 Other specified abnormal immunological findings in serum: Secondary | ICD-10-CM | POA: Diagnosis not present

## 2023-10-07 DIAGNOSIS — H209 Unspecified iridocyclitis: Secondary | ICD-10-CM | POA: Diagnosis not present

## 2023-10-07 DIAGNOSIS — E669 Obesity, unspecified: Secondary | ICD-10-CM | POA: Diagnosis not present

## 2023-10-07 DIAGNOSIS — Z79899 Other long term (current) drug therapy: Secondary | ICD-10-CM | POA: Diagnosis not present

## 2023-10-07 DIAGNOSIS — G629 Polyneuropathy, unspecified: Secondary | ICD-10-CM | POA: Diagnosis not present

## 2023-10-07 DIAGNOSIS — M25551 Pain in right hip: Secondary | ICD-10-CM | POA: Diagnosis not present

## 2023-10-07 DIAGNOSIS — M3501 Sicca syndrome with keratoconjunctivitis: Secondary | ICD-10-CM | POA: Diagnosis not present

## 2023-10-07 DIAGNOSIS — M0579 Rheumatoid arthritis with rheumatoid factor of multiple sites without organ or systems involvement: Secondary | ICD-10-CM | POA: Diagnosis not present

## 2023-10-07 DIAGNOSIS — H9191 Unspecified hearing loss, right ear: Secondary | ICD-10-CM | POA: Diagnosis not present

## 2023-10-07 DIAGNOSIS — M5030 Other cervical disc degeneration, unspecified cervical region: Secondary | ICD-10-CM | POA: Diagnosis not present

## 2023-10-12 NOTE — Progress Notes (Unsigned)
Judy [name] D.[facility] [address] [place] [address] Phone: [phone]   Assessment and Plan:     There are no diagnoses linked to this encounter.  ***   Pertinent previous records reviewed include ***   Follow Up: ***     Subjective:   I, General Wearing, am serving as a Neurosurgeon for Doctor Richardean Sale   Chief Complaint: left knee pain    HPI:    05/18/23 Patient is a 72 year old female complaining of left knee pain. Patient states that she was stepping down from a high truck Tuesday, her big dog ran into her Sunday. Celbrex for the pain and that helps, pain isnt horrible but if she flexes she has pain , she states when she tweaks her knee she will have swelling, does have a brace with her. No radiating pain, posterior knee pain.    06/15/2023 Patient states that she is generally good, gave out on her Friday took about  mins for her to walk but issue has resolved   10/13/2023 Patient states   Relevant Historical Information: Hypertension, history of PE not currently on anticoagulation, GERD, rheumatoid arthritis Additional pertinent review of systems negative.   Current Outpatient Medications:    buPROPion (WELLBUTRIN XL) 300 MG 24 hr tablet, TAKE 1 TABLET BY MOUTH EVERY DAY, Disp: 30 tablet, Rfl: 3   candesartan-hydrochlorothiazide (ATACAND HCT) 16-12.5 MG tablet, Take 1 tablet by mouth daily., Disp: , Rfl:    celecoxib (CELEBREX) 200 MG capsule, Take 1 capsule (200 mg total) by mouth 2 (two) times daily., Disp: 180 capsule, Rfl: 0   Cholecalciferol (VITAMIN D) 1000 UNITS capsule, Take 1,000 Units by mouth daily., Disp: , Rfl:    Coenzyme Q10 200 MG capsule, Take 1 capsule (200 mg total) by mouth daily., Disp: 90 capsule, Rfl: 1   cyclobenzaprine (FLEXERIL) 10 MG tablet, TAKE 1 TABLET BY MOUTH THREE TIMES A DAY AS NEEDED, Disp: 45 tablet, Rfl: 1   Dietary Management Product (RHEUMATE) CAPS, Take 1 capsule by mouth  daily., Disp: , Rfl:    eszopiclone (LUNESTA) 2 MG TABS tablet, Take 2 mg by mouth as needed., Disp: , Rfl:    famotidine (PEPCID) 20 MG tablet, Take 1 tablet (20 mg total) by mouth daily., Disp: 90 tablet, Rfl: 0   folic acid (FOLVITE) 1 MG tablet, Take 3 mg by mouth daily., Disp: , Rfl:    inFLIXimab-abda (RENFLEXIS) 100 MG SOLR, Inject 100 mg into the vein every 8 (eight) weeks. Remicaid infusion every 8 weeks for RA, Disp: , Rfl:    meclizine (ANTIVERT) 12.5 MG tablet, Take 1 tablet (12.5 mg total) by mouth 3 (three) times daily as needed for dizziness., Disp: 30 tablet, Rfl: 0   methotrexate 50 MG/2ML injection, Inject 25 mg into the skin once a week., Disp: , Rfl:    Multiple Vitamin (QUINTABS) TABS, Take 1 tablet by mouth daily., Disp: , Rfl:    nystatin-triamcinolone (MYCOLOG II) cream, Apply 1 application topically 2 (two) times daily., Disp: 30 g, Rfl: 1   pantoprazole (PROTONIX) 40 MG tablet, TAKE 1 TABLET BY MOUTH EVERY DAY, Disp: 30 tablet, Rfl: 29   rosuvastatin (CRESTOR) 5 MG tablet, TAKE 1 TABLET (5 MG TOTAL) BY MOUTH DAILY., Disp: 90 tablet, Rfl: 0   Objective:     There were no vitals filed for this visit.    There is no height or weight on file to calculate BMI.  Physical Exam:    ***   Electronically signed by:  Judy [name] D.[facility] 4:08 PM 10/12/23

## 2023-10-13 ENCOUNTER — Ambulatory Visit (INDEPENDENT_AMBULATORY_CARE_PROVIDER_SITE_OTHER): Payer: Medicare HMO | Admitting: Sports Medicine

## 2023-10-13 VITALS — HR 85 | Ht 66.0 in | Wt 203.0 lb

## 2023-10-13 DIAGNOSIS — M25562 Pain in left knee: Secondary | ICD-10-CM | POA: Diagnosis not present

## 2023-10-13 DIAGNOSIS — G8929 Other chronic pain: Secondary | ICD-10-CM | POA: Diagnosis not present

## 2023-10-13 DIAGNOSIS — M1712 Unilateral primary osteoarthritis, left knee: Secondary | ICD-10-CM | POA: Diagnosis not present

## 2023-10-14 ENCOUNTER — Ambulatory Visit (HOSPITAL_COMMUNITY)
Admission: RE | Admit: 2023-10-14 | Discharge: 2023-10-14 | Disposition: A | Discharge status: Home / Self Care | Payer: Medicare HMO | Source: Ambulatory Visit | Attending: Gastroenterology | Admitting: Gastroenterology

## 2023-10-14 DIAGNOSIS — Z903 Acquired absence of stomach [part of]: Secondary | ICD-10-CM | POA: Insufficient documentation

## 2023-10-14 DIAGNOSIS — K219 Gastro-esophageal reflux disease without esophagitis: Secondary | ICD-10-CM | POA: Insufficient documentation

## 2023-10-14 DIAGNOSIS — R110 Nausea: Secondary | ICD-10-CM | POA: Insufficient documentation

## 2023-10-14 DIAGNOSIS — Z4682 Encounter for fitting and adjustment of non-vascular catheter: Secondary | ICD-10-CM | POA: Diagnosis not present

## 2023-10-15 ENCOUNTER — Other Ambulatory Visit: Payer: Self-pay | Admitting: Medical

## 2023-10-27 ENCOUNTER — Ambulatory Visit (INDEPENDENT_AMBULATORY_CARE_PROVIDER_SITE_OTHER): Payer: Medicare HMO | Admitting: Medical

## 2023-10-27 ENCOUNTER — Ambulatory Visit: Payer: Medicare HMO | Attending: Internal Medicine

## 2023-10-27 ENCOUNTER — Other Ambulatory Visit: Payer: Self-pay

## 2023-10-27 VITALS — BP 150/86 | HR 76 | Temp 98.0°F | Resp 18 | Ht 66.0 in | Wt 217.6 lb

## 2023-10-27 DIAGNOSIS — M5386 Other specified dorsopathies, lumbar region: Secondary | ICD-10-CM | POA: Diagnosis not present

## 2023-10-27 DIAGNOSIS — M545 Low back pain, unspecified: Secondary | ICD-10-CM | POA: Diagnosis not present

## 2023-10-27 DIAGNOSIS — R944 Abnormal results of kidney function studies: Secondary | ICD-10-CM

## 2023-10-27 DIAGNOSIS — R350 Frequency of micturition: Secondary | ICD-10-CM | POA: Diagnosis not present

## 2023-10-27 DIAGNOSIS — R319 Hematuria, unspecified: Secondary | ICD-10-CM | POA: Diagnosis not present

## 2023-10-27 DIAGNOSIS — R29898 Other symptoms and signs involving the musculoskeletal system: Secondary | ICD-10-CM | POA: Insufficient documentation

## 2023-10-27 DIAGNOSIS — I10 Essential (primary) hypertension: Secondary | ICD-10-CM

## 2023-10-27 DIAGNOSIS — G8929 Other chronic pain: Secondary | ICD-10-CM | POA: Diagnosis not present

## 2023-10-27 LAB — POCT URINALYSIS DIPSTICK
Bilirubin, UA: NEGATIVE
Glucose, UA: NEGATIVE
Ketones, UA: NEGATIVE
Leukocytes, UA: NEGATIVE
Nitrite, UA: NEGATIVE
Protein, UA: NEGATIVE
Spec Grav, UA: <=1.005 — AB (ref 1.010–1.025)
Urobilinogen, UA: 0.2 U/dL
pH, UA: 6 (ref 5.0–8.0)

## 2023-10-27 MED ORDER — CIPROFLOXACIN HCL 250 MG PO TABS: 250.0000 mg | ORAL_TABLET | Freq: Two times a day (BID) | ORAL | 0 refills | Status: AC
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
## 2023-10-27 NOTE — Patient Instructions (Addendum)
1. Urinary frequency -urine clear but frequent urination and faint suprapubic tenderness. Rx cipro low dose to pharmacy to start if signs/symptoms worsen pending urine culture or if culture +. - POCT Urinalysis Dipstick - Urine Culture - Comp Met (CMET); Future  2. Hypertension, unspecified type -on candesartan. Please double check if hydrochlorothiazide combo. Hydrate well and recheck kidney function tomorrow morning. Want to recheck cr and gfr before potentially increasing dose of candesartan or giving new med. - Comp Met (CMET); Future  3. Hematuria, unspecified type -chronic and upcoming repeat cystoscopy in decemmber.  Follow up date to be determined after  lab review.

## 2023-10-27 NOTE — Progress Notes (Unsigned)
   Subjective:    Patient ID: Judy [name], female    DOB: 08-Oct-1951, 72 y.o.   MRN: [id number]  HPI  Pt in for recent urinary symptoms.  Pt did see urologist back in the summer.  " 1. Dysplasia of bladder       Plan: Today I had a long discussion with the patient.  (Total time =80min).  We discussed the implications and natural history of urothelial dysplasia in detail today with the patient.  I emphasized to her that the majority of patients do not progress to CIS or invasive cancer but given that risk close follow-up is warranted.  She will return in 3 months for cystoscopy and cytology.  In the future we may be able to monitor her in addition with CX bladder detect"  Pt has repeat cystoscopy on 11-17-23.  Pt in today reporting urinary symptoms.  Dysuria-  Frequent urination- over weekend had one hour when had to urinate 5 times. On average about one time at least every 1.5 hours.  Hesitancy-no Suprapubic pressure-no Fever-no chills-no Nausea-no Vomiting-no CVA pain-no History of UTI- some by culture but majority of cultures not +. Gross hematuria- no  Pt blood pressure has been in 140 to mid 150 range systolic.    Review of Systems  Constitutional:  Negative for chills, fatigue and fever.  Respiratory:  Negative for chest tightness.        Objective:   Physical Exam        Assessment & Plan:   Patient Instructions  1. Urinary frequency -urine clear but frequent urination and faint suprapubic tenderness. Rx cipro low dose to pharmacy to start if signs/symptoms worsen pending urine culture or if culture +. - POCT Urinalysis Dipstick - Urine Culture - Comp Met (CMET); Future  2. Hypertension, unspecified type -on candesartan. Please double check if hydrochlorothiazide combo. Hydrate well and recheck kidney function tomorrow morning. Want to recheck cr and gfr before potentially increaseing dose of candesartan or giving new med. - Comp Met (CMET);  Future  3. Hematuria, unspecified type -chronic and upcoming repeat cystoscopy in decemember.  Follow up date to be determined after  lab review.    Esperanza Richters, PA-C

## 2023-10-28 ENCOUNTER — Other Ambulatory Visit (INDEPENDENT_AMBULATORY_CARE_PROVIDER_SITE_OTHER): Payer: Medicare HMO

## 2023-10-28 ENCOUNTER — Encounter: Payer: Self-pay | Admitting: Medical

## 2023-10-28 DIAGNOSIS — R350 Frequency of micturition: Secondary | ICD-10-CM | POA: Diagnosis not present

## 2023-10-28 DIAGNOSIS — I10 Essential (primary) hypertension: Secondary | ICD-10-CM | POA: Diagnosis not present

## 2023-10-28 LAB — COMPREHENSIVE METABOLIC PANEL
ALT: 11 U/L (ref 0–35)
AST: 13 U/L (ref 0–37)
Albumin: 3.9 g/dL (ref 3.5–5.2)
Alkaline Phosphatase: 76 U/L (ref 39–117)
BUN: 16 mg/dL (ref 6–23)
CO2: 33 meq/L — ABNORMAL HIGH (ref 19–32)
Calcium: 8.8 mg/dL (ref 8.4–10.5)
Chloride: 102 meq/L (ref 96–112)
Creatinine, Ser: 0.95 mg/dL (ref 0.40–1.20)
GFR: 60.84 mL/min (ref >60.00)
Glucose, Bld: 92 mg/dL (ref 70–99)
Potassium: 3.9 meq/L (ref 3.5–5.1)
Sodium: 140 meq/L (ref 135–145)
Total Bilirubin: 0.5 mg/dL (ref 0.2–1.2)
Total Protein: 6.6 g/dL (ref 6.0–8.3)

## 2023-10-28 MED ORDER — CANDESARTAN CILEXETIL 32 MG PO TABS: 32.0000 mg | ORAL_TABLET | Freq: Every day | ORAL | 0 refills | Status: DC

## 2023-10-28 NOTE — Addendum Note (Signed)
Addended by: Gwenevere Abbot on: 10/28/2023 05:14 PM   Modules accepted: Orders

## 2023-10-29 LAB — URINE CULTURE
MICRO NUMBER:: 15750259
Result:: NO GROWTH
SPECIMEN QUALITY:: ADEQUATE

## 2023-10-29 MED ORDER — OXYBUTYNIN CHLORIDE ER 5 MG PO TB24
5.0000 mg | ORAL_TABLET | Freq: Every day | ORAL | 0 refills | Status: DC
Start: 2023-10-29 — End: 2023-11-22

## 2023-10-29 NOTE — Addendum Note (Signed)
Addended by: Gwenevere Abbot on: 10/29/2023 05:52 PM   Modules accepted: Orders

## 2023-11-09 ENCOUNTER — Encounter (HOSPITAL_BASED_OUTPATIENT_CLINIC_OR_DEPARTMENT_OTHER): Payer: Self-pay | Admitting: Physical Therapy

## 2023-11-09 ENCOUNTER — Ambulatory Visit (HOSPITAL_BASED_OUTPATIENT_CLINIC_OR_DEPARTMENT_OTHER): Payer: Medicare HMO | Attending: Internal Medicine | Admitting: Physical Therapy

## 2023-11-09 DIAGNOSIS — R531 Weakness: Secondary | ICD-10-CM | POA: Insufficient documentation

## 2023-11-09 DIAGNOSIS — M0579 Rheumatoid arthritis with rheumatoid factor of multiple sites without organ or systems involvement: Secondary | ICD-10-CM | POA: Diagnosis not present

## 2023-11-09 DIAGNOSIS — M5459 Other low back pain: Secondary | ICD-10-CM | POA: Diagnosis not present

## 2023-11-09 DIAGNOSIS — R5383 Other fatigue: Secondary | ICD-10-CM | POA: Diagnosis not present

## 2023-11-09 DIAGNOSIS — M51369 Other intervertebral disc degeneration, lumbar region without mention of lumbar back pain or lower extremity pain: Secondary | ICD-10-CM | POA: Diagnosis not present

## 2023-11-09 DIAGNOSIS — Z79899 Other long term (current) drug therapy: Secondary | ICD-10-CM | POA: Diagnosis not present

## 2023-11-09 DIAGNOSIS — M545 Low back pain, unspecified: Secondary | ICD-10-CM

## 2023-11-09 DIAGNOSIS — R29898 Other symptoms and signs involving the musculoskeletal system: Secondary | ICD-10-CM
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
## 2023-11-11 ENCOUNTER — Ambulatory Visit (HOSPITAL_BASED_OUTPATIENT_CLINIC_OR_DEPARTMENT_OTHER): Payer: Medicare HMO | Admitting: Physical Therapy

## 2023-11-11 ENCOUNTER — Encounter (HOSPITAL_BASED_OUTPATIENT_CLINIC_OR_DEPARTMENT_OTHER): Payer: Self-pay | Admitting: Physical Therapy

## 2023-11-11 DIAGNOSIS — M545 Low back pain, unspecified: Secondary | ICD-10-CM

## 2023-11-11 DIAGNOSIS — R531 Weakness: Secondary | ICD-10-CM | POA: Diagnosis not present

## 2023-11-11 DIAGNOSIS — M51369 Other intervertebral disc degeneration, lumbar region without mention of lumbar back pain or lower extremity pain: Secondary | ICD-10-CM | POA: Diagnosis not present

## 2023-11-11 DIAGNOSIS — M5386 Other specified dorsopathies, lumbar region: Secondary | ICD-10-CM

## 2023-11-11 DIAGNOSIS — R29898 Other symptoms and signs involving the musculoskeletal system: Secondary | ICD-10-CM

## 2023-11-11 DIAGNOSIS — M5459 Other low back pain: Secondary | ICD-10-CM | POA: Diagnosis not present
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
## 2023-11-12 ENCOUNTER — Encounter (INDEPENDENT_AMBULATORY_CARE_PROVIDER_SITE_OTHER): Payer: Self-pay

## 2023-11-12 ENCOUNTER — Encounter: Payer: Self-pay | Admitting: Urology

## 2023-11-12 ENCOUNTER — Other Ambulatory Visit: Payer: Self-pay | Admitting: Medical

## 2023-11-16 DIAGNOSIS — R319 Hematuria, unspecified: Secondary | ICD-10-CM | POA: Diagnosis not present

## 2023-11-16 DIAGNOSIS — R896 Abnormal cytological findings in specimens from other organs, systems and tissues: Secondary | ICD-10-CM | POA: Diagnosis not present

## 2023-11-16 DIAGNOSIS — R3129 Other microscopic hematuria: Secondary | ICD-10-CM | POA: Diagnosis not present

## 2023-11-17 ENCOUNTER — Ambulatory Visit (INDEPENDENT_AMBULATORY_CARE_PROVIDER_SITE_OTHER): Payer: Medicare HMO | Admitting: Urology

## 2023-11-17 ENCOUNTER — Encounter: Payer: Self-pay | Admitting: Urology

## 2023-11-17 VITALS — BP 143/80 | HR 75

## 2023-11-17 DIAGNOSIS — Q6470 Unspecified congenital malformation of bladder and urethra: Secondary | ICD-10-CM

## 2023-11-17 DIAGNOSIS — R3129 Other microscopic hematuria: Secondary | ICD-10-CM

## 2023-11-17 MED ORDER — SULFAMETHOXAZOLE-TRIMETHOPRIM 800-160 MG PO TABS
1.0000 | ORAL_TABLET | Freq: Once | ORAL | Status: AC
  Administered 2023-11-17: 1 via ORAL
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
## 2023-11-18 ENCOUNTER — Ambulatory Visit: Payer: Medicare HMO | Attending: Internal Medicine

## 2023-11-18 DIAGNOSIS — R29898 Other symptoms and signs involving the musculoskeletal system: Secondary | ICD-10-CM | POA: Insufficient documentation

## 2023-11-18 DIAGNOSIS — M5386 Other specified dorsopathies, lumbar region: Secondary | ICD-10-CM | POA: Diagnosis not present

## 2023-11-18 DIAGNOSIS — M545 Low back pain, unspecified: Secondary | ICD-10-CM | POA: Diagnosis not present

## 2023-11-18 DIAGNOSIS — G8929 Other chronic pain: Secondary | ICD-10-CM | POA: Insufficient documentation

## 2023-11-18 LAB — URINALYSIS, ROUTINE W REFLEX MICROSCOPIC
Bilirubin, UA: NEGATIVE
Glucose, UA: NEGATIVE
Ketones, UA: NEGATIVE
Leukocytes,UA: NEGATIVE
Nitrite, UA: NEGATIVE
Protein,UA: NEGATIVE
Specific Gravity, UA: 1.02 (ref 1.005–1.030)
Urobilinogen, Ur: 0.2 mg/dL (ref 0.2–1.0)
pH, UA: 7 (ref 5.0–7.5)

## 2023-11-18 LAB — MICROSCOPIC EXAMINATION
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
## 2023-11-22 ENCOUNTER — Encounter: Payer: Self-pay | Admitting: Urology

## 2023-11-22 ENCOUNTER — Other Ambulatory Visit: Payer: Self-pay | Admitting: Urology

## 2023-11-22 MED ORDER — SOLIFENACIN SUCCINATE 10 MG PO TABS: 10.0000 mg | ORAL_TABLET | Freq: Every day | ORAL | 5 refills | Status: DC

## 2023-11-23 ENCOUNTER — Encounter: Payer: Self-pay | Admitting: Urology

## 2023-11-25 ENCOUNTER — Encounter (HOSPITAL_BASED_OUTPATIENT_CLINIC_OR_DEPARTMENT_OTHER): Payer: Self-pay | Admitting: Physical Therapy

## 2023-11-25 ENCOUNTER — Ambulatory Visit (HOSPITAL_BASED_OUTPATIENT_CLINIC_OR_DEPARTMENT_OTHER): Payer: Medicare HMO | Admitting: Physical Therapy

## 2023-11-25 DIAGNOSIS — M5459 Other low back pain: Secondary | ICD-10-CM | POA: Diagnosis not present

## 2023-11-25 DIAGNOSIS — R29898 Other symptoms and signs involving the musculoskeletal system: Secondary | ICD-10-CM

## 2023-11-25 DIAGNOSIS — M545 Low back pain, unspecified: Secondary | ICD-10-CM

## 2023-11-25 DIAGNOSIS — R531 Weakness: Secondary | ICD-10-CM | POA: Diagnosis not present

## 2023-11-25 DIAGNOSIS — M51369 Other intervertebral disc degeneration, lumbar region without mention of lumbar back pain or lower extremity pain: Secondary | ICD-10-CM | POA: Diagnosis not present

## 2023-11-25 DIAGNOSIS — M5386 Other specified dorsopathies, lumbar region: Secondary | ICD-10-CM
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
## 2023-11-26 ENCOUNTER — Encounter: Payer: Self-pay | Admitting: Urology

## 2023-11-27 ENCOUNTER — Telehealth: Payer: Self-pay

## 2023-11-27 ENCOUNTER — Other Ambulatory Visit: Payer: Self-pay | Admitting: Medical

## 2023-11-27 NOTE — Telephone Encounter (Signed)
-----   Message from [name] sent at 11/26/2023  4:46 PM EST ----- Regarding: Cx bladder test Let her know her Cx bladder test was good.  Minimal risk of bladder cancer ----- Message ----- From: Kipp Brood Sent: 11/26/2023   3:00 PM EST To: [name], MD

## 2023-11-27 NOTE — Telephone Encounter (Signed)
Pt aware of the results and verbalized understanding.

## 2023-11-28 ENCOUNTER — Other Ambulatory Visit: Payer: Self-pay | Admitting: Medical

## 2023-12-08 ENCOUNTER — Ambulatory Visit (HOSPITAL_BASED_OUTPATIENT_CLINIC_OR_DEPARTMENT_OTHER): Payer: Medicare HMO | Admitting: Physical Therapy

## 2023-12-10 ENCOUNTER — Other Ambulatory Visit (INDEPENDENT_AMBULATORY_CARE_PROVIDER_SITE_OTHER): Payer: Medicare HMO

## 2023-12-10 ENCOUNTER — Other Ambulatory Visit: Payer: Medicare HMO

## 2023-12-10 ENCOUNTER — Telehealth: Payer: Self-pay

## 2023-12-10 DIAGNOSIS — R944 Abnormal results of kidney function studies: Secondary | ICD-10-CM | POA: Diagnosis not present

## 2023-12-10 LAB — COMPREHENSIVE METABOLIC PANEL
ALT: 21 U/L (ref 0–35)
AST: 21 U/L (ref 0–37)
Albumin: 4.1 g/dL (ref 3.5–5.2)
Alkaline Phosphatase: 72 U/L (ref 39–117)
BUN: 21 mg/dL (ref 6–23)
CO2: 26 meq/L (ref 19–32)
Calcium: 9 mg/dL (ref 8.4–10.5)
Chloride: 103 meq/L (ref 96–112)
Creatinine, Ser: 0.95 mg/dL (ref 0.40–1.20)
GFR: 60.79 mL/min (ref >60.00)
Glucose, Bld: 109 mg/dL — ABNORMAL HIGH (ref 70–99)
Potassium: 4.1 meq/L (ref 3.5–5.1)
Sodium: 139 meq/L (ref 135–145)
Total Bilirubin: 0.7 mg/dL (ref 0.2–1.2)
Total Protein: 6.7 g/dL (ref 6.0–8.3)

## 2023-12-10 NOTE — Telephone Encounter (Signed)
 No labs placed in future?

## 2023-12-10 NOTE — Telephone Encounter (Signed)
 Caller Name Landy Mace Caller Phone Number [phone] Patient Name Judy [name] Patient DOB 1951/08/11 Call Type Message Only Information Provided Reason for Call Request to Schedule Office Appointment Initial Comment Caller states they need an appt fot fasting labs. Patient request to speak to RN No Additional Comment Provided office hours. Disp. Time Disposition Final User 12/10/2023 7:12:04 AM General Information Provided Yes Lori Frieze Call Closed By: Frieze Lori Transaction Date/Time: 12/10/2023 7:09:53 AM (ET)

## 2023-12-10 NOTE — Telephone Encounter (Signed)
Lab visit done

## 2023-12-14 DIAGNOSIS — H9191 Unspecified hearing loss, right ear: Secondary | ICD-10-CM | POA: Diagnosis not present

## 2023-12-14 DIAGNOSIS — M5030 Other cervical disc degeneration, unspecified cervical region: Secondary | ICD-10-CM | POA: Diagnosis not present

## 2023-12-14 DIAGNOSIS — Z79899 Other long term (current) drug therapy: Secondary | ICD-10-CM | POA: Diagnosis not present

## 2023-12-14 DIAGNOSIS — Z6834 Body mass index (BMI) 34.0-34.9, adult: Secondary | ICD-10-CM | POA: Diagnosis not present

## 2023-12-14 DIAGNOSIS — M0579 Rheumatoid arthritis with rheumatoid factor of multiple sites without organ or systems involvement: Secondary | ICD-10-CM | POA: Diagnosis not present

## 2023-12-14 DIAGNOSIS — E669 Obesity, unspecified: Secondary | ICD-10-CM | POA: Diagnosis not present

## 2023-12-14 DIAGNOSIS — G629 Polyneuropathy, unspecified: Secondary | ICD-10-CM | POA: Diagnosis not present

## 2023-12-14 DIAGNOSIS — M25551 Pain in right hip: Secondary | ICD-10-CM | POA: Diagnosis not present

## 2023-12-14 DIAGNOSIS — H209 Unspecified iridocyclitis: Secondary | ICD-10-CM | POA: Diagnosis not present

## 2023-12-14 DIAGNOSIS — R768 Other specified abnormal immunological findings in serum: Secondary | ICD-10-CM | POA: Diagnosis not present

## 2023-12-14 DIAGNOSIS — M3501 Sicca syndrome with keratoconjunctivitis: Secondary | ICD-10-CM | POA: Diagnosis not present

## 2023-12-15 ENCOUNTER — Other Ambulatory Visit: Payer: Self-pay | Admitting: Medical

## 2023-12-15 ENCOUNTER — Other Ambulatory Visit: Payer: Self-pay

## 2023-12-15 ENCOUNTER — Ambulatory Visit: Payer: Medicare HMO | Admitting: "Endocrinology

## 2023-12-15 ENCOUNTER — Encounter: Payer: Self-pay | Admitting: Medical

## 2023-12-15 ENCOUNTER — Ambulatory Visit: Payer: Medicare HMO | Attending: Internal Medicine

## 2023-12-15 DIAGNOSIS — R29898 Other symptoms and signs involving the musculoskeletal system: Secondary | ICD-10-CM | POA: Insufficient documentation

## 2023-12-15 DIAGNOSIS — M5386 Other specified dorsopathies, lumbar region: Secondary | ICD-10-CM | POA: Diagnosis not present

## 2023-12-15 DIAGNOSIS — G8929 Other chronic pain: Secondary | ICD-10-CM | POA: Insufficient documentation

## 2023-12-15 DIAGNOSIS — M545 Low back pain, unspecified: Secondary | ICD-10-CM | POA: Insufficient documentation
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
## 2023-12-17 ENCOUNTER — Ambulatory Visit (HOSPITAL_BASED_OUTPATIENT_CLINIC_OR_DEPARTMENT_OTHER): Payer: Medicare HMO | Attending: Internal Medicine | Admitting: Physical Therapy

## 2023-12-17 ENCOUNTER — Encounter (HOSPITAL_BASED_OUTPATIENT_CLINIC_OR_DEPARTMENT_OTHER): Payer: Self-pay | Admitting: Physical Therapy

## 2023-12-17 DIAGNOSIS — G8929 Other chronic pain: Secondary | ICD-10-CM | POA: Insufficient documentation

## 2023-12-17 DIAGNOSIS — M545 Low back pain, unspecified: Secondary | ICD-10-CM | POA: Diagnosis not present

## 2023-12-17 DIAGNOSIS — R29898 Other symptoms and signs involving the musculoskeletal system: Secondary | ICD-10-CM | POA: Diagnosis not present
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
## 2023-12-24 ENCOUNTER — Other Ambulatory Visit: Payer: Self-pay

## 2023-12-24 ENCOUNTER — Ambulatory Visit: Payer: Medicare HMO

## 2023-12-24 DIAGNOSIS — M5386 Other specified dorsopathies, lumbar region: Secondary | ICD-10-CM | POA: Diagnosis not present

## 2023-12-24 DIAGNOSIS — G8929 Other chronic pain: Secondary | ICD-10-CM | POA: Diagnosis not present

## 2023-12-24 DIAGNOSIS — R29898 Other symptoms and signs involving the musculoskeletal system: Secondary | ICD-10-CM

## 2023-12-24 DIAGNOSIS — M545 Low back pain, unspecified: Secondary | ICD-10-CM | POA: Diagnosis not present
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
## 2023-12-25 ENCOUNTER — Other Ambulatory Visit: Payer: Self-pay | Admitting: Medical

## 2024-01-04 DIAGNOSIS — M0579 Rheumatoid arthritis with rheumatoid factor of multiple sites without organ or systems involvement: Secondary | ICD-10-CM | POA: Diagnosis not present

## 2024-01-04 DIAGNOSIS — Z79899 Other long term (current) drug therapy: Secondary | ICD-10-CM | POA: Diagnosis not present

## 2024-01-12 ENCOUNTER — Ambulatory Visit (INDEPENDENT_AMBULATORY_CARE_PROVIDER_SITE_OTHER): Payer: Medicare HMO

## 2024-01-12 ENCOUNTER — Ambulatory Visit (INDEPENDENT_AMBULATORY_CARE_PROVIDER_SITE_OTHER): Payer: Medicare HMO | Admitting: Sports Medicine

## 2024-01-12 VITALS — BP 130/78 | HR 80 | Ht 66.0 in | Wt 217.0 lb

## 2024-01-12 DIAGNOSIS — M19012 Primary osteoarthritis, left shoulder: Secondary | ICD-10-CM | POA: Diagnosis not present

## 2024-01-12 DIAGNOSIS — M25512 Pain in left shoulder: Secondary | ICD-10-CM

## 2024-01-12 DIAGNOSIS — M7552 Bursitis of left shoulder: Secondary | ICD-10-CM

## 2024-01-12 NOTE — Patient Instructions (Signed)
Shoulder HEP  Tylenol as needed  4 week follow up

## 2024-01-12 NOTE — Progress Notes (Signed)
 Ben Tariya Morrissette D.[facility] [address] [place] [address] Phone: [phone]   Assessment and Plan:     1. Left shoulder pain, unspecified chronicity -Acute, initial sports visit - Consistent with subacromial bursitis of left shoulder likely occurring due to increased physical activity over the weekend - X-ray obtained in clinic.  My interpretation: No acute fracture or dislocation.  Mild degenerative changes along the greater tubercle, mild glenohumeral spurring, decreased humeral head acromial space potentially leading to impingement - Patient has not had significant relief with Celebrex  or Flexeril  courses for the past 2 to 3 days.  May continue Celebrex  100 200 mg as needed for pain relief - Patient elected for subacromial CSI.  Tolerated well per note below  Procedure: Subacromial Injection Side: Left  Risks explained and consent was given verbally. The site was cleaned with alcohol prep. A steroid injection was performed from posterior approach using 2mL of 1% lidocaine  without epinephrine  and 1mL of kenalog  40mg /ml. This was well tolerated and resulted in symptomatic relief.  Needle was removed, hemostasis achieved, and post injection instructions were explained.   Pt was advised to call or return to clinic if these symptoms worsen or fail to improve as anticipated.    15 additional minutes spent for educating Therapeutic Home Exercise Program.  This included exercises focusing on stretching, strengthening, with focus on eccentric aspects.   Long term goals include an improvement in range of motion, strength, endurance as well as avoiding reinjury. Patient's frequency would include in 1-2 times a day, 3-5 times a week for a duration of 6-12 weeks. Proper technique shown and discussed handout in great detail with ATC.  All questions were discussed and answered.     Pertinent previous records reviewed include none  Follow Up: 4 weeks for  reevaluation.  If no improvement or worsening of symptoms, could consider physical therapy versus ultrasound versus NSAID/prednisone  course   Subjective:   I, Judy [name], am serving as a neurosurgeon for Doctor Morene Mace  Chief Complaint: left shoulder pain   HPI:   01/12/24 Patient is a 73 year old female with left shoulder pain. Patient states hx of over use injury. She was using her shoulder on Sunday she had to move things during a sewage back up. Decreased ROM. She used Celebrex  and a muscle relaxer non of those helped. Decrease in grip strength. 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   Current Outpatient Medications:    buPROPion  (WELLBUTRIN  XL) 300 MG 24 hr tablet, TAKE 1 TABLET BY MOUTH EVERY DAY, Disp: 30 tablet, Rfl: 3   candesartan  (ATACAND ) 32 MG tablet, TAKE 1 TABLET BY MOUTH DAILY., Disp: 30 tablet, Rfl: 0   celecoxib  (CELEBREX ) 200 MG capsule, Take 1 capsule (200 mg total) by mouth 2 (two) times daily., Disp: 180 capsule, Rfl: 0   Cholecalciferol (VITAMIN D ) 1000 UNITS capsule, Take 1,000 Units by mouth daily., Disp: , Rfl:    Coenzyme Q10 200 MG capsule, Take 1 capsule (200 mg total) by mouth daily., Disp: 90 capsule, Rfl: 1   cyclobenzaprine  (FLEXERIL ) 10 MG tablet, TAKE 1 TABLET BY MOUTH THREE TIMES A DAY AS NEEDED, Disp: 45 tablet, Rfl: 1   Dietary Management Product (RHEUMATE) CAPS, Take 1 capsule by mouth daily., Disp: , Rfl:    eszopiclone  (LUNESTA ) 2 MG TABS tablet, Take 2 mg by mouth  as needed., Disp: , Rfl:    famotidine  (PEPCID ) 20 MG tablet, Take 1 tablet (20 mg total) by mouth daily., Disp: 90 tablet, Rfl: 0   folic acid (FOLVITE) 1 MG tablet, Take 3 mg by mouth daily., Disp: , Rfl:    inFLIXimab -abda (RENFLEXIS ) 100 MG SOLR, Inject 100 mg into the vein every 8 (eight) weeks. Remicaid infusion every  8 weeks for RA, Disp: , Rfl:    methotrexate  50 MG/2ML injection, Inject 25 mg into the skin once a week., Disp: , Rfl:    Multiple Vitamin (QUINTABS) TABS, Take 1 tablet by mouth daily., Disp: , Rfl:    nystatin -triamcinolone  (MYCOLOG II) cream, Apply 1 application topically 2 (two) times daily., Disp: 30 g, Rfl: 1   pantoprazole  (PROTONIX ) 40 MG tablet, TAKE 1 TABLET BY MOUTH EVERY DAY, Disp: 30 tablet, Rfl: 29   rosuvastatin  (CRESTOR ) 5 MG tablet, TAKE 1 TABLET (5 MG TOTAL) BY MOUTH DAILY., Disp: 90 tablet, Rfl: 0   solifenacin  (VESICARE ) 10 MG tablet, Take 1 tablet (10 mg total) by mouth daily., Disp: 30 tablet, Rfl: 5   Objective:     Vitals:   01/12/24 1423  BP: 130/78  Pulse: 80  SpO2: 100%  Weight: 217 lb (98.4 kg)  Height: 5' 6 (1.676 m)      Body mass index is 35.02 kg/m.    Physical Exam:    Gen: Appears well, nad, nontoxic and pleasant Neuro:sensation intact, strength is 5/5 with df/pf/inv/ev, muscle tone wnl Skin: no suspicious lesion or defmority Psych: A&O, appropriate mood and affect  Left shoulder:  No deformity, swelling or muscle wasting No scapular winging FF 60, abd 60, int 20, ext 70 TTP AC, deltoid, trapezius NTTP over the [place], clavicle,  biceps groove, humerus, d  cervical spine Special testing limited by limited ROM Neg ant drawer, sulcus sign, apprehension Negative Spurling's test bilat FROM of neck    Electronically signed by:  Odis Mace D.[facility] 2:40 PM 01/12/24

## 2024-01-13 DIAGNOSIS — E782 Mixed hyperlipidemia: Secondary | ICD-10-CM | POA: Diagnosis not present

## 2024-01-14 LAB — LIPID PANEL
Chol/HDL Ratio: 3.8 {ratio} (ref 0.0–4.4)
Cholesterol, Total: 280 mg/dL — ABNORMAL HIGH (ref 100–199)
HDL: 74 mg/dL (ref >39)
LDL Chol Calc (NIH): 192 mg/dL — ABNORMAL HIGH (ref 0–99)
Triglycerides: 85 mg/dL (ref 0–149)
VLDL Cholesterol Cal: 14 mg/dL (ref 5–40)

## 2024-01-14 LAB — COMPREHENSIVE METABOLIC PANEL
ALT: 17 [IU]/L (ref 0–32)
AST: 18 [IU]/L (ref 0–40)
Albumin: 4.1 g/dL (ref 3.9–4.9)
Alkaline Phosphatase: 84 [IU]/L (ref 44–121)
BUN/Creatinine Ratio: 16 (ref 12–28)
BUN: 15 mg/dL (ref 8–27)
Bilirubin Total: 0.7 mg/dL (ref 0.0–1.2)
CO2: 23 mmol/L (ref 20–29)
Calcium: 9.1 mg/dL (ref 8.7–10.3)
Chloride: 103 mmol/L (ref 96–106)
Creatinine, Ser: 0.94 mg/dL (ref 0.57–1.00)
Globulin, Total: 2.8 g/dL (ref 1.5–4.5)
Glucose: 99 mg/dL (ref 70–99)
Potassium: 4.4 mmol/L (ref 3.5–5.2)
Sodium: 140 mmol/L (ref 134–144)
Total Protein: 6.9 g/dL (ref 6.0–8.5)
eGFR: 65 mL/min/{1.73_m2} (ref >59)

## 2024-01-19 ENCOUNTER — Ambulatory Visit (INDEPENDENT_AMBULATORY_CARE_PROVIDER_SITE_OTHER): Payer: Medicare HMO | Admitting: "Endocrinology

## 2024-01-19 ENCOUNTER — Encounter: Payer: Self-pay | Admitting: "Endocrinology

## 2024-01-19 VITALS — BP 128/84 | HR 76 | Ht 66.0 in | Wt 225.2 lb

## 2024-01-19 DIAGNOSIS — E782 Mixed hyperlipidemia: Secondary | ICD-10-CM | POA: Diagnosis not present

## 2024-01-19 DIAGNOSIS — R7303 Prediabetes: Secondary | ICD-10-CM | POA: Diagnosis not present

## 2024-01-19 DIAGNOSIS — I10 Essential (primary) hypertension: Secondary | ICD-10-CM | POA: Diagnosis not present

## 2024-01-19 LAB — POCT GLYCOSYLATED HEMOGLOBIN (HGB A1C): HbA1c, POC (prediabetic range): 6.1 % (ref 5.7–6.4)

## 2024-01-19 MED ORDER — REPATHA 140 MG/ML ~~LOC~~ SOSY: 140.0000 mg | PREFILLED_SYRINGE | SUBCUTANEOUS | 2 refills | Status: DC
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
## 2024-01-22 ENCOUNTER — Encounter: Payer: Self-pay | Admitting: "Endocrinology

## 2024-01-22 ENCOUNTER — Telehealth: Payer: Self-pay

## 2024-01-22 NOTE — Telephone Encounter (Signed)
Notified per pt her Repatha needs prior auth.

## 2024-01-25 ENCOUNTER — Other Ambulatory Visit (HOSPITAL_COMMUNITY): Payer: Self-pay

## 2024-01-25 ENCOUNTER — Telehealth: Payer: Self-pay

## 2024-01-25 NOTE — Telephone Encounter (Signed)
Pharmacy Patient Advocate Encounter   Received notification from Pt Calls Messages that prior authorization for Repatha is required/requested.   Insurance verification completed.   The patient is insured through [facility] .   Per test claim: PA required; PA submitted to above mentioned insurance via CoverMyMeds Key/confirmation #/EOC [facility] Status is pending

## 2024-01-26 NOTE — Telephone Encounter (Signed)
Left a message making pt aware. 

## 2024-01-26 NOTE — Telephone Encounter (Signed)
Pharmacy Patient Advocate Encounter  Received notification from [facility] that Prior Authorization for Repatha has been APPROVED through 12/07/2024   PA #/Case ID/Reference #: [id number]

## 2024-01-28 ENCOUNTER — Telehealth: Payer: Self-pay

## 2024-01-28 ENCOUNTER — Other Ambulatory Visit (HOSPITAL_COMMUNITY): Payer: Self-pay

## 2024-01-28 ENCOUNTER — Encounter: Payer: Self-pay | Admitting: Sports Medicine

## 2024-01-28 NOTE — Telephone Encounter (Signed)
Pt's secondary insurance is [employer], [employer]BS/Federal Emp [place] Group # [id number] Group Name: Abran Duke # Elvera Lennox [id number] Name is under Bennetta Laos E. Address [address] [facility] [place] [address]

## 2024-01-28 NOTE — Telephone Encounter (Signed)
Pt notified the office through MyChart that her Repatha was approved through [facility] but states it also needs a PA sent to her secondary insurance [employer] FEP. She left a contact # of [phone] otherwise she will have a $400.00 copay each month.

## 2024-01-28 NOTE — Telephone Encounter (Signed)
Sent pt a MyChart message making her aware we need insurance info.

## 2024-01-29 ENCOUNTER — Other Ambulatory Visit (HOSPITAL_COMMUNITY): Payer: Self-pay

## 2024-02-01 ENCOUNTER — Other Ambulatory Visit (HOSPITAL_COMMUNITY): Payer: Self-pay

## 2024-02-03 NOTE — Telephone Encounter (Signed)
 Pt has called back stating even though her primary insurance approved Repatha she also needs authorization from her secondary insurance [employer] fed. She left a contact # for them of [phone]. States her ins # [id number].

## 2024-02-04 ENCOUNTER — Other Ambulatory Visit: Payer: Self-pay | Admitting: Medical

## 2024-02-04 ENCOUNTER — Telehealth: Payer: Self-pay

## 2024-02-04 NOTE — Telephone Encounter (Signed)
 I sent a MyChart message to pt regarding her address, waiting for response.

## 2024-02-04 NOTE — Telephone Encounter (Signed)
 Thank you :)

## 2024-02-04 NOTE — Telephone Encounter (Signed)
 Pt shared that her husband is Drusilla Wampole and that they do have two addresses. The other is listed as [address] [place], [place] [address].

## 2024-02-04 NOTE — Telephone Encounter (Signed)
 Pharmacy Patient Advocate Encounter   Received notification from Pt Calls Messages that prior authorization for Repatha is required/requested through secondary insurance.   Insurance verification completed.   The patient is insured through [facility] .   Per test claim: PA required; PA submitted to above mentioned insurance via CoverMyMeds Key/confirmation #/EOC  Tiffany Kocher Status is pending
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
## 2024-02-08 ENCOUNTER — Ambulatory Visit: Payer: Medicare HMO | Admitting: Sports Medicine

## 2024-02-09 ENCOUNTER — Other Ambulatory Visit (HOSPITAL_COMMUNITY): Payer: Self-pay

## 2024-02-09 NOTE — Telephone Encounter (Signed)
 Pharmacy Patient Advocate Encounter  Received notification from [facility] that Prior Authorization for Repatha has been APPROVED through 02/03/25

## 2024-02-09 NOTE — Telephone Encounter (Signed)
 Pt made aware

## 2024-02-13 ENCOUNTER — Other Ambulatory Visit: Payer: Self-pay | Admitting: "Endocrinology

## 2024-02-14 ENCOUNTER — Other Ambulatory Visit: Payer: Self-pay | Admitting: Medical

## 2024-02-29 DIAGNOSIS — M0579 Rheumatoid arthritis with rheumatoid factor of multiple sites without organ or systems involvement: Secondary | ICD-10-CM | POA: Diagnosis not present

## 2024-02-29 DIAGNOSIS — Z79899 Other long term (current) drug therapy: Secondary | ICD-10-CM | POA: Diagnosis not present

## 2024-03-04 ENCOUNTER — Other Ambulatory Visit: Payer: Self-pay | Admitting: Medical

## 2024-03-29 ENCOUNTER — Ambulatory Visit: Admitting: Medical

## 2024-03-30 ENCOUNTER — Ambulatory Visit (HOSPITAL_BASED_OUTPATIENT_CLINIC_OR_DEPARTMENT_OTHER)
Admission: RE | Admit: 2024-03-30 | Discharge: 2024-03-30 | Disposition: A | Discharge status: Home / Self Care | Source: Ambulatory Visit | Attending: Medical | Admitting: Medical

## 2024-03-30 ENCOUNTER — Encounter: Payer: Self-pay | Admitting: Medical

## 2024-03-30 ENCOUNTER — Ambulatory Visit (INDEPENDENT_AMBULATORY_CARE_PROVIDER_SITE_OTHER): Admitting: Medical

## 2024-03-30 VITALS — BP 126/66 | HR 74 | Resp 18 | Ht 66.0 in | Wt 229.0 lb

## 2024-03-30 DIAGNOSIS — E785 Hyperlipidemia, unspecified: Secondary | ICD-10-CM | POA: Diagnosis not present

## 2024-03-30 DIAGNOSIS — M79605 Pain in left leg: Secondary | ICD-10-CM | POA: Diagnosis not present

## 2024-03-30 DIAGNOSIS — J90 Pleural effusion, not elsewhere classified: Secondary | ICD-10-CM

## 2024-03-30 DIAGNOSIS — R5383 Other fatigue: Secondary | ICD-10-CM | POA: Diagnosis not present

## 2024-03-30 DIAGNOSIS — R0600 Dyspnea, unspecified: Secondary | ICD-10-CM | POA: Insufficient documentation

## 2024-03-30 DIAGNOSIS — R635 Abnormal weight gain: Secondary | ICD-10-CM

## 2024-03-30 DIAGNOSIS — R918 Other nonspecific abnormal finding of lung field: Secondary | ICD-10-CM | POA: Diagnosis not present

## 2024-03-30 DIAGNOSIS — M7989 Other specified soft tissue disorders: Secondary | ICD-10-CM | POA: Insufficient documentation

## 2024-03-30 DIAGNOSIS — E559 Vitamin D deficiency, unspecified: Secondary | ICD-10-CM | POA: Diagnosis not present

## 2024-03-30 DIAGNOSIS — M79609 Pain in unspecified limb: Secondary | ICD-10-CM

## 2024-03-30 DIAGNOSIS — M7122 Synovial cyst of popliteal space [Baker], left knee: Secondary | ICD-10-CM | POA: Diagnosis not present

## 2024-03-30 DIAGNOSIS — Q676 Pectus excavatum: Secondary | ICD-10-CM | POA: Diagnosis not present

## 2024-03-30 DIAGNOSIS — D649 Anemia, unspecified: Secondary | ICD-10-CM

## 2024-03-30 NOTE — Patient Instructions (Signed)
 Shortness of breath on exertion Intermittent dyspnea on exertion without orthopnea. Differential maybe chf, fluid retention, or deconditioning. Recent episodes of  borderline elevated heart rate without exertion. - Order chest x-ray and BNP test.  Edema of lower extremities Recent onset of lower extremity edema, particularly in the ankles, with mild popliteal pain. Possible fluid retention or venous insufficiency. - Order stat ultrasound of the leg to assess for DVT.  Obesity Weight increased from 204 lbs to 229 lbs. Discussed potential use of GLP-1 agonists like Wegovy for weight management, considering insurance coverage and contraindications such as thyroid  cancer or pancreatitis. Emphasized maintaining muscle mass during weight loss. - Consider GLP-1 agonists like Wegovy for weight management. No  contraindications and if insurance allows. - Encourage dietary tracking using tools like Weight Watchers or My Fitness Pal. - Encourage regular exercise to maintain muscle mass.  Prediabetes A1c is 6.1, indicating prediabetes, previously 5.6. Discussed importance of monitoring and lifestyle modifications to prevent progression to diabetes. - Order fasting lipid panel. - Encourage lifestyle modifications including diet and exercise.  Fatigue Increased fatigue possibly related to weight gain, vitamin deficiencies, or metabolic issues. - Order CBC, metabolic panel, TSH, T4, B12, B1, and vitamin D  tests.  Vitamin D  deficiency Likely vitamin D  deficiency due to limited sun exposure and history of adult rickets. - Check vitamin D  level.  Follow up date to be determined after lab and imaging review.
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
## 2024-03-31 ENCOUNTER — Encounter: Payer: Self-pay | Admitting: Medical

## 2024-03-31 LAB — LIPID PANEL
Cholesterol: 219 mg/dL — ABNORMAL HIGH (ref 0–200)
HDL: 63.6 mg/dL (ref >39.00)
LDL Cholesterol: 132 mg/dL — ABNORMAL HIGH (ref 0–99)
NonHDL: 155.1
Total CHOL/HDL Ratio: 3
Triglycerides: 118 mg/dL (ref 0.0–149.0)
VLDL: 23.6 mg/dL (ref 0.0–40.0)

## 2024-03-31 LAB — CBC WITH DIFFERENTIAL/PLATELET
Basophils Absolute: 0 10*3/uL (ref 0.0–0.1)
Basophils Relative: 0.7 % (ref 0.0–3.0)
Eosinophils Absolute: 0.2 10*3/uL (ref 0.0–0.7)
Eosinophils Relative: 2.2 % (ref 0.0–5.0)
HCT: 34.4 % — ABNORMAL LOW (ref 36.0–46.0)
Hemoglobin: 11.2 g/dL — ABNORMAL LOW (ref 12.0–15.0)
Lymphocytes Relative: 34.9 % (ref 12.0–46.0)
Lymphs Abs: 2.5 10*3/uL (ref 0.7–4.0)
MCHC: 32.6 g/dL (ref 30.0–36.0)
MCV: 88.7 fl (ref 78.0–100.0)
Monocytes Absolute: 0.6 10*3/uL (ref 0.1–1.0)
Monocytes Relative: 9 % (ref 3.0–12.0)
Neutro Abs: 3.9 10*3/uL (ref 1.4–7.7)
Neutrophils Relative %: 53.2 % (ref 43.0–77.0)
Platelets: 308 10*3/uL (ref 150.0–400.0)
RBC: 3.88 Mil/uL (ref 3.87–5.11)
RDW: 14.8 % (ref 11.5–15.5)
WBC: 7.2 10*3/uL (ref 4.0–10.5)

## 2024-03-31 LAB — COMPREHENSIVE METABOLIC PANEL WITH GFR
ALT: 17 U/L (ref 0–35)
AST: 23 U/L (ref 0–37)
Albumin: 3.8 g/dL (ref 3.5–5.2)
Alkaline Phosphatase: 63 U/L (ref 39–117)
BUN: 15 mg/dL (ref 6–23)
CO2: 28 meq/L (ref 19–32)
Calcium: 8.9 mg/dL (ref 8.4–10.5)
Chloride: 104 meq/L (ref 96–112)
Creatinine, Ser: 1.07 mg/dL (ref 0.40–1.20)
GFR: 52.59 mL/min — ABNORMAL LOW (ref >60.00)
Glucose, Bld: 91 mg/dL (ref 70–99)
Potassium: 3.9 meq/L (ref 3.5–5.1)
Sodium: 139 meq/L (ref 135–145)
Total Bilirubin: 0.5 mg/dL (ref 0.2–1.2)
Total Protein: 6.4 g/dL (ref 6.0–8.3)

## 2024-03-31 LAB — VITAMIN B12: Vitamin B-12: 1053 pg/mL — ABNORMAL HIGH (ref 211–911)

## 2024-03-31 LAB — VITAMIN D 25 HYDROXY (VIT D DEFICIENCY, FRACTURES): VITD: 46.6 ng/mL (ref 30.00–100.00)

## 2024-03-31 LAB — T4, FREE: Free T4: 0.76 ng/dL (ref 0.60–1.60)

## 2024-03-31 LAB — BRAIN NATRIURETIC PEPTIDE: Pro B Natriuretic peptide (BNP): 38 pg/mL (ref 0.0–100.0)

## 2024-03-31 LAB — TSH: TSH: 2.24 u[IU]/mL (ref 0.35–5.50)

## 2024-04-02 ENCOUNTER — Other Ambulatory Visit: Payer: Self-pay | Admitting: Medical

## 2024-04-03 LAB — VITAMIN B1: Vitamin B1 (Thiamine): 11 nmol/L (ref 8–30)

## 2024-04-04 ENCOUNTER — Encounter: Payer: Self-pay | Admitting: Medical

## 2024-04-04 ENCOUNTER — Encounter: Payer: Self-pay | Admitting: Sports Medicine

## 2024-04-04 NOTE — Addendum Note (Signed)
 Addended by: Serafina Damme on: 04/04/2024 07:58 PM   Modules accepted: Orders

## 2024-04-04 NOTE — Addendum Note (Signed)
 Addended by: Serafina Damme on: 04/04/2024 08:07 PM   Modules accepted: Orders

## 2024-04-06 ENCOUNTER — Ambulatory Visit: Admitting: Sports Medicine

## 2024-04-06 NOTE — Progress Notes (Deleted)
 Ben Jackson D[facility] [address] [place] [address] Phone: [phone]   Assessment and Plan:     There are no diagnoses linked to this encounter.  ***   Pertinent previous records reviewed include ***    Follow Up: ***     Subjective:   I, Caryn Gienger, am serving as a Neurosurgeon for Doctor Ulysees Gander   Chief Complaint: left knee pain    HPI:    05/18/23 Patient is a 73 year old female complaining of left knee pain. Patient states that she was stepping down from a high truck Tuesday, her big dog ran into her Sunday. Celbrex for the pain and that helps, pain isnt horrible but if she flexes she has pain , she states when she tweaks her knee she will have swelling, does have a brace with her. No radiating pain, posterior knee pain.    06/15/2023 Patient states that she is generally good, gave out on her Friday took about  mins for her to walk but issue has resolved    10/13/2023 Patient states that her pain has returned   04/06/2024 Patient states    Relevant Historical Information: Hypertension, history of PE not currently on anticoagulation, GERD, rheumatoid arthritis  Additional pertinent review of systems negative.   Current Outpatient Medications:    buPROPion  (WELLBUTRIN  XL) 300 MG 24 hr tablet, TAKE 1 TABLET BY MOUTH EVERY DAY, Disp: 30 tablet, Rfl: 3   candesartan  (ATACAND ) 32 MG tablet, TAKE 1 TABLET BY MOUTH EVERY DAY, Disp: 30 tablet, Rfl: 0   celecoxib  (CELEBREX ) 200 MG capsule, Take 1 capsule (200 mg total) by mouth 2 (two) times daily., Disp: 180 capsule, Rfl: 0   Cholecalciferol (VITAMIN D ) 1000 UNITS capsule, Take 1,000 Units by mouth daily., Disp: , Rfl:    Coenzyme Q10 200 MG capsule, Take 1 capsule (200 mg total) by mouth daily., Disp: 90 capsule, Rfl: 1   cyclobenzaprine  (FLEXERIL ) 10 MG tablet, TAKE 1 TABLET BY MOUTH THREE TIMES A DAY AS NEEDED, Disp: 45 tablet, Rfl: 1   Dietary Management Product  (RHEUMATE) CAPS, Take 1 capsule by mouth daily., Disp: , Rfl:    eszopiclone  (LUNESTA ) 2 MG TABS tablet, Take 2 mg by mouth as needed., Disp: , Rfl:    Evolocumab  (REPATHA ) 140 MG/ML SOSY, Inject 140 mg into the skin every 14 (fourteen) days., Disp: 2.1 mL, Rfl: 2   famotidine  (PEPCID ) 20 MG tablet, TAKE 1 TABLET BY MOUTH EVERY DAY, Disp: 90 tablet, Rfl: 0   folic acid (FOLVITE) 1 MG tablet, Take 3 mg by mouth daily., Disp: , Rfl:    inFLIXimab -abda (RENFLEXIS ) 100 MG SOLR, Inject 100 mg into the vein every 8 (eight) weeks. Remicaid infusion every 8 weeks for RA, Disp: , Rfl:    methotrexate  50 MG/2ML injection, Inject 25 mg into the skin once a week., Disp: , Rfl:    Multiple Vitamin (QUINTABS) TABS, Take 1 tablet by mouth daily., Disp: , Rfl:    nystatin -triamcinolone  (MYCOLOG II) cream, Apply 1 application topically 2 (two) times daily., Disp: 30 g, Rfl: 1   pantoprazole  (PROTONIX ) 40 MG tablet, TAKE 1 TABLET BY MOUTH EVERY DAY, Disp: 30 tablet, Rfl: 29   rosuvastatin  (CRESTOR ) 5 MG tablet, TAKE 1 TABLET (5 MG TOTAL) BY MOUTH DAILY., Disp: 90 tablet, Rfl: 0   solifenacin  (VESICARE ) 10 MG tablet, Take 1 tablet (10 mg total) by mouth daily., Disp: 30 tablet, Rfl: 5   Objective:  There were no vitals filed for this visit.    There is no height or weight on file to calculate BMI.    Physical Exam:    ***   Electronically signed by:  Marshall Skeeter D[facility] 12:29 PM 04/06/24

## 2024-04-07 ENCOUNTER — Ambulatory Visit: Payer: Self-pay

## 2024-04-07 ENCOUNTER — Other Ambulatory Visit (INDEPENDENT_AMBULATORY_CARE_PROVIDER_SITE_OTHER)

## 2024-04-07 ENCOUNTER — Ambulatory Visit (INDEPENDENT_AMBULATORY_CARE_PROVIDER_SITE_OTHER): Admitting: Sports Medicine

## 2024-04-07 VITALS — BP 128/78 | HR 79 | Ht 66.0 in | Wt 226.0 lb

## 2024-04-07 DIAGNOSIS — M7122 Synovial cyst of popliteal space [Baker], left knee: Secondary | ICD-10-CM

## 2024-04-07 DIAGNOSIS — M25562 Pain in left knee: Secondary | ICD-10-CM

## 2024-04-07 DIAGNOSIS — M1712 Unilateral primary osteoarthritis, left knee: Secondary | ICD-10-CM | POA: Diagnosis not present

## 2024-04-07 DIAGNOSIS — D649 Anemia, unspecified: Secondary | ICD-10-CM | POA: Diagnosis not present

## 2024-04-07 DIAGNOSIS — G8929 Other chronic pain: Secondary | ICD-10-CM | POA: Diagnosis not present

## 2024-04-07 NOTE — Progress Notes (Signed)
 Ben Tammy Wickliffe D.[facility] [address] [place] [address] Phone: [phone]   Assessment and Plan:     1. Chronic pain of left knee (Primary) 2. Primary osteoarthritis of left knee 3. Baker's cyst of knee, left -Chronic with exacerbation, subsequent visit - Patient had recurrent flare of severe left knee pain with large intra-articular effusion resulting in 5.6 cm Baker's cyst as seen on ultrasound on 03/30/2024 - Patient's knee pain, effusion of significantly improved with relative rest - Discussed treatment options which include continuing Celebrex  100 mg twice daily as needed, intra-articular CSI, Baker's cyst aspiration.  With patient's pain decreasing and effusion and swelling resolving, patient elected to continue with conservative therapy at this time. -Continue Celebrex  100 mg twice daily as needed for pain relief.  Recommend limiting chronic NSAIDs to 1-2 doses per week - Continue HEP for knee   Pertinent previous records reviewed include     Follow Up: As needed if no improvement or worsening of symptoms.  If Baker's cyst worsens or becomes symptomatic, could discuss aspiration.  Could discuss repeat CSI with last intra-articular knee CSI performed on 10/13/2023   Subjective:   I, Judy [name] am a scribe for Dr. Cleora Daft.     Chief Complaint: left knee pain    HPI:    05/18/23 Patient is a 73 year old female complaining of left knee pain. Patient states that she was stepping down from a high truck Tuesday, her big dog ran into her Sunday. Celbrex for the pain and that helps, pain isnt horrible but if she flexes she has pain , she states when she tweaks her knee she will have swelling, does have a brace with her. No radiating pain, posterior knee pain.    06/15/2023 Patient states that she is generally good, gave out on her Friday took about  mins for her to walk but issue has resolved    10/13/2023 Patient states that her pain  has returned   04/07/2024 Patient states that knee is ok. Had ultrasound 10 days ago and a cyst was found. No flare ups of pain this week.   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   Current Outpatient Medications:    buPROPion  (WELLBUTRIN  XL) 300 MG 24 hr tablet, TAKE 1 TABLET BY MOUTH EVERY DAY, Disp: 30 tablet, Rfl: 3   candesartan  (ATACAND ) 32 MG tablet, TAKE 1 TABLET BY MOUTH EVERY DAY, Disp: 30 tablet, Rfl: 0   celecoxib  (CELEBREX ) 200 MG capsule, Take 1 capsule (200 mg total) by mouth 2 (two) times daily., Disp: 180 capsule, Rfl: 0   Cholecalciferol (VITAMIN D ) 1000 UNITS capsule, Take 1,000 Units by mouth daily., Disp: , Rfl:    cyclobenzaprine  (FLEXERIL ) 10 MG tablet, TAKE 1 TABLET BY MOUTH THREE TIMES A DAY AS NEEDED, Disp: 45 tablet, Rfl: 1   Dietary Management Product (RHEUMATE) CAPS, Take 1 capsule by mouth daily., Disp: , Rfl:    Evolocumab  (REPATHA ) 140 MG/ML SOSY, Inject 140 mg into the skin every 14 (fourteen) days., Disp: 2.1 mL, Rfl: 2   famotidine  (PEPCID ) 20 MG tablet, TAKE 1 TABLET BY MOUTH EVERY DAY, Disp: 90 tablet, Rfl: 0   inFLIXimab -abda (RENFLEXIS ) 100 MG SOLR, Inject 100 mg into the vein every 8 (eight) weeks. Remicaid infusion every 8 weeks for RA, Disp: , Rfl:    methotrexate  50 MG/2ML injection, Inject 25 mg into the  skin once a week., Disp: , Rfl:    Multiple Vitamin (QUINTABS) TABS, Take 1 tablet by mouth daily., Disp: , Rfl:    nystatin -triamcinolone  (MYCOLOG II) cream, Apply 1 application topically 2 (two) times daily., Disp: 30 g, Rfl: 1   pantoprazole  (PROTONIX ) 40 MG tablet, TAKE 1 TABLET BY MOUTH EVERY DAY, Disp: 30 tablet, Rfl: 29   solifenacin  (VESICARE ) 10 MG tablet, Take 1 tablet (10 mg total) by mouth daily., Disp: 30 tablet, Rfl: 5   Coenzyme Q10 200 MG capsule, Take 1 capsule (200 mg total) by mouth daily., Disp: 90  capsule, Rfl: 1   eszopiclone  (LUNESTA ) 2 MG TABS tablet, Take 2 mg by mouth as needed., Disp: , Rfl:    folic acid (FOLVITE) 1 MG tablet, Take 3 mg by mouth daily., Disp: , Rfl:    rosuvastatin  (CRESTOR ) 5 MG tablet, TAKE 1 TABLET (5 MG TOTAL) BY MOUTH DAILY., Disp: 90 tablet, Rfl: 0   Objective:     Vitals:   04/07/24 0819  BP: 128/78  Pulse: 79  SpO2: 98%  Weight: 226 lb (102.5 kg)  Height: 5\' 6"  (1.676 m)      Body mass index is 36.48 kg/m.    Physical Exam:    General:  awake, alert oriented, no acute distress nontoxic Skin: no suspicious lesions or rashes Neuro:sensation intact and strength 5/5 with no deficits, no atrophy, normal muscle tone Psych: No signs of anxiety, depression or other mood disorder  Left knee: Mild to moderate swelling No deformity Positive fluid wave, joint milking ROM Flex 105, Ext 0 NTTP over the quad tendon, medial fem condyle, lat fem condyle, patella, plica, patella tendon, tibial tuberostiy, fibular head, posterior fossa, pes anserine bursa, gerdy's tubercle, medial jt line, lateral jt line Neg anterior and posterior drawer Neg lachman Neg sag sign Negative varus stress Negative valgus stress Negative McMurray for palpable pop, though reproduced pain Positive Thessaly  Gait normal    Electronically signed by:  Marshall Skeeter [facility] 9:46 AM 04/07/24

## 2024-04-07 NOTE — Patient Instructions (Signed)
 Tylenol  for day to day pain relief Recommend getting a compression sleeve  As needed follow up

## 2024-04-08 LAB — IRON,TIBC AND FERRITIN PANEL
%SAT: 18 % (ref 16–45)
Ferritin: 19 ng/mL (ref 16–288)
Iron: 57 ug/dL (ref 45–160)
TIBC: 315 ug/dL (ref 250–450)

## 2024-04-10 ENCOUNTER — Encounter: Payer: Self-pay | Admitting: Medical

## 2024-04-11 ENCOUNTER — Encounter (HOSPITAL_BASED_OUTPATIENT_CLINIC_OR_DEPARTMENT_OTHER): Payer: Self-pay | Admitting: Pulmonary Disease

## 2024-04-11 ENCOUNTER — Ambulatory Visit (HOSPITAL_BASED_OUTPATIENT_CLINIC_OR_DEPARTMENT_OTHER): Admitting: Pulmonary Disease

## 2024-04-11 VITALS — BP 126/84 | HR 73 | Ht 66.0 in | Wt 227.0 lb

## 2024-04-11 DIAGNOSIS — M06042 Rheumatoid arthritis without rheumatoid factor, left hand: Secondary | ICD-10-CM

## 2024-04-11 DIAGNOSIS — M06012 Rheumatoid arthritis without rheumatoid factor, left shoulder: Secondary | ICD-10-CM

## 2024-04-11 DIAGNOSIS — J90 Pleural effusion, not elsewhere classified: Secondary | ICD-10-CM | POA: Diagnosis not present

## 2024-04-11 DIAGNOSIS — M06011 Rheumatoid arthritis without rheumatoid factor, right shoulder: Secondary | ICD-10-CM

## 2024-04-11 DIAGNOSIS — M06812 Other specified rheumatoid arthritis, left shoulder: Secondary | ICD-10-CM

## 2024-04-11 DIAGNOSIS — M06811 Other specified rheumatoid arthritis, right shoulder: Secondary | ICD-10-CM

## 2024-04-11 DIAGNOSIS — R2240 Localized swelling, mass and lump, unspecified lower limb: Secondary | ICD-10-CM | POA: Diagnosis not present

## 2024-04-11 DIAGNOSIS — M06041 Rheumatoid arthritis without rheumatoid factor, right hand: Secondary | ICD-10-CM | POA: Diagnosis not present

## 2024-04-11 NOTE — Progress Notes (Signed)
 Subjective:    Patient ID: Judy [name], female    DOB: May 15, 1951, 72 y.o.   MRN: [id number]  HPI   Judy [name] is a 73 year old female with rheumatoid arthritis who presents with leg swelling and shortness of breath. She was referred by Lemont Quails for evaluation of leg swelling and possible fluid retention.  She has experienced leg swelling from the knee to the ankle for several days. Elevation did not resolve the swelling, but it has decreased without diuretics. An x-ray and ultrasound showed an effusion without blood clots.  She experiences shortness of breath with moderate activity, such as walking in a store, but not at rest. Previously, she could walk three miles daily but has not done so recently. No wheezing is present.  She has rheumatoid arthritis, diagnosed in 2021, treated with prednisone , methotrexate , and Remicade . Her arthritis affects her shoulders, hands, and elbows, causing significant functional impairment.  She had sleep apnea, which resolved with weight loss, and a blood clot in 1977 post-hysterectomy, with no recurrence.   PMH :  hypertension , hyperlipidemia, chronic back pain and a remote history of PE 1976 post hysterectomy  RADIOLOGY Ultrasound: No blood clots Chest X-ray: Blunting of the angles, possible pleural thickening CT Chest: Clear, no effusion, possible epicardial fat (06/2023)  DIAGNOSTIC REPORTS Cardiac Scan: Good calcium  score (2021)  Significant tests/ events reviewed  NPSG ( [place]  ) 2009- PSG 7/09 -260 lbs - AHI 8/h corrected by nasal CPAP 12 cm.  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 10/2011 AHI 5.6/h -wt 235 lbs, REM AHI 17/h (95 mins).   Review of Systems Constitutional: negative for anorexia, fevers and sweats  Eyes: negative for irritation, redness and visual disturbance  Ears, nose, mouth, throat, and face: negative  for earaches, epistaxis, nasal congestion and sore throat  Respiratory: negative for cough, dyspnea on exertion, sputum and wheezing  Cardiovascular: negative for chest pain, dyspnea, lower extremity edema, orthopnea, palpitations and syncope  Gastrointestinal: negative for abdominal pain, constipation, diarrhea, melena, nausea and vomiting  Genitourinary:negative for dysuria, frequency and hematuria  Hematologic/lymphatic: negative for bleeding, easy bruising and lymphadenopathy  Musculoskeletal:negative for arthralgias, muscle weakness and stiff joints  Neurological: negative for coordination problems, gait problems, headaches and weakness  Endocrine: negative for diabetic symptoms including polydipsia, polyuria and weight loss     Objective:   Physical Exam  Gen. Pleasant, obese, in no distress ENT - no lesions, no post nasal drip Neck: No JVD, no thyromegaly, no carotid bruits Lungs: no use of accessory muscles, no dullness to percussion, decreased without rales or rhonchi  Cardiovascular: Rhythm regular, heart sounds  normal, no murmurs or gallops, no peripheral edema Musculoskeletal: No deformities, no cyanosis or clubbing , no tremors       Assessment & Plan:     Pleural effusion vs. pleural thickening Imaging suggests possible pleural effusion or pleural thickening, but previous CT scans indicate epicardial fat as the likely cause. Shortness of breath is attributed to weight gain and stress. Further evaluation with CT scan is planned to clarify findings and rule out rheumatoid arthritis-related changes. - Schedule CT scan to clarify pleural findings. - Review CT results and provide reassurance if findings are consistent with previous imaging.  Rheumatoid arthritis Diagnosed in 2021 with symptoms affecting elbows, shoulders, and hands. Managed with prednisone  4 mg daily, methotrexate  20 mg weekly via  injection, and Remicade  infusions every 8 weeks. Symptoms include joint  inflammation and difficulty using hands, previously requiring physical therapy.  Sleep apnea Previously mild sleep apnea resolved with weight loss. No current symptoms reported.  Leg swelling Intermittent leg swelling from knee to ankle, previously evaluated with ultrasound showing no blood clots. Swelling has resolved without intervention. No diuretics were prescribed.

## 2024-04-11 NOTE — Patient Instructions (Signed)
 X Ct chest wo con

## 2024-04-12 ENCOUNTER — Ambulatory Visit: Admitting: Internal Medicine

## 2024-04-18 ENCOUNTER — Ambulatory Visit (HOSPITAL_BASED_OUTPATIENT_CLINIC_OR_DEPARTMENT_OTHER)
Admission: RE | Admit: 2024-04-18 | Discharge: 2024-04-18 | Disposition: A | Discharge status: Home / Self Care | Source: Ambulatory Visit | Attending: Pulmonary Disease | Admitting: Pulmonary Disease

## 2024-04-18 ENCOUNTER — Ambulatory Visit (HOSPITAL_BASED_OUTPATIENT_CLINIC_OR_DEPARTMENT_OTHER)

## 2024-04-18 DIAGNOSIS — J90 Pleural effusion, not elsewhere classified: Secondary | ICD-10-CM | POA: Insufficient documentation

## 2024-04-18 DIAGNOSIS — I700 Atherosclerosis of aorta: Secondary | ICD-10-CM | POA: Diagnosis not present

## 2024-04-18 DIAGNOSIS — Q790 Congenital diaphragmatic hernia: Secondary | ICD-10-CM | POA: Diagnosis not present

## 2024-04-21 ENCOUNTER — Ambulatory Visit: Payer: Medicare HMO | Admitting: "Endocrinology

## 2024-04-25 ENCOUNTER — Ambulatory Visit: Payer: Self-pay | Admitting: Pulmonary Disease

## 2024-04-25 NOTE — Progress Notes (Signed)
 Pt.notified

## 2024-04-27 DIAGNOSIS — M0579 Rheumatoid arthritis with rheumatoid factor of multiple sites without organ or systems involvement: Secondary | ICD-10-CM | POA: Diagnosis not present

## 2024-04-29 ENCOUNTER — Other Ambulatory Visit: Payer: Self-pay | Admitting: "Endocrinology

## 2024-05-01 ENCOUNTER — Other Ambulatory Visit: Payer: Self-pay | Admitting: Medical

## 2024-05-03 ENCOUNTER — Ambulatory Visit (INDEPENDENT_AMBULATORY_CARE_PROVIDER_SITE_OTHER): Admitting: Medical

## 2024-05-03 VITALS — BP 128/76 | HR 84 | Resp 18 | Ht 66.0 in | Wt 224.0 lb

## 2024-05-03 DIAGNOSIS — E669 Obesity, unspecified: Secondary | ICD-10-CM | POA: Diagnosis not present

## 2024-05-03 DIAGNOSIS — I10 Essential (primary) hypertension: Secondary | ICD-10-CM | POA: Diagnosis not present

## 2024-05-03 DIAGNOSIS — E785 Hyperlipidemia, unspecified: Secondary | ICD-10-CM | POA: Diagnosis not present

## 2024-05-03 DIAGNOSIS — R42 Dizziness and giddiness: Secondary | ICD-10-CM | POA: Diagnosis not present

## 2024-05-03 DIAGNOSIS — M7122 Synovial cyst of popliteal space [Baker], left knee: Secondary | ICD-10-CM

## 2024-05-03 DIAGNOSIS — Z8669 Personal history of other diseases of the nervous system and sense organs: Secondary | ICD-10-CM | POA: Diagnosis not present

## 2024-05-03 MED ORDER — WEGOVY 0.25 MG/0.5ML ~~LOC~~ SOAJ: 0.2500 mg | SUBCUTANEOUS | 0 refills | Status: DC
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
## 2024-05-03 NOTE — Patient Instructions (Signed)
 Vertigo Intermittent vertigo improving, no neurological symptoms. Previous episodes responded to Epley maneuvers. - Refer to physical therapy for Epley maneuvers. -if any vertigo with motor or sensory deficits be seen in the ED>  Obesity Persistent obesity with unsuccessful dietary and exercise interventions (failed effort with both ww and my fitness pal). Discussed GLP-1 receptor agonist. Committed to healthy lifestyle changes. - Prescribe Wegovy 0.25 mg injection. - Encourage continuation of healthy diet and regular exercise, including walking two miles daily and adding swimming.  Hypertension Well controlled with candesartan  32 mg daily.  Hyperlipidemia Managed with Repatha , awaiting authorization. - Ensure Repatha  authorization is completed.  Follow up one month or sooner if needed

## 2024-05-06 ENCOUNTER — Ambulatory Visit: Admitting: Medical

## 2024-05-06 DIAGNOSIS — R350 Frequency of micturition: Secondary | ICD-10-CM | POA: Diagnosis not present

## 2024-05-06 DIAGNOSIS — N9089 Other specified noninflammatory disorders of vulva and perineum: Secondary | ICD-10-CM | POA: Diagnosis not present

## 2024-05-06 DIAGNOSIS — R351 Nocturia: Secondary | ICD-10-CM | POA: Diagnosis not present

## 2024-05-10 ENCOUNTER — Ambulatory Visit: Payer: Self-pay

## 2024-05-16 ENCOUNTER — Encounter: Payer: Self-pay | Admitting: "Endocrinology

## 2024-05-17 ENCOUNTER — Ambulatory Visit: Payer: Medicare HMO | Admitting: Urology

## 2024-05-17 ENCOUNTER — Other Ambulatory Visit: Payer: Self-pay

## 2024-05-17 DIAGNOSIS — E782 Mixed hyperlipidemia: Secondary | ICD-10-CM | POA: Diagnosis not present

## 2024-05-18 LAB — LIPID PANEL
Chol/HDL Ratio: 2.9 ratio (ref 0.0–4.4)
Cholesterol, Total: 174 mg/dL (ref 100–199)
HDL: 59 mg/dL (ref >39)
LDL Chol Calc (NIH): 81 mg/dL (ref 0–99)
Triglycerides: 203 mg/dL — ABNORMAL HIGH (ref 0–149)
VLDL Cholesterol Cal: 34 mg/dL (ref 5–40)

## 2024-05-19 ENCOUNTER — Encounter: Payer: Self-pay | Admitting: "Endocrinology

## 2024-05-19 ENCOUNTER — Ambulatory Visit: Admitting: "Endocrinology

## 2024-05-19 VITALS — BP 138/86 | HR 80 | Ht 66.0 in | Wt 227.8 lb

## 2024-05-19 DIAGNOSIS — I10 Essential (primary) hypertension: Secondary | ICD-10-CM | POA: Diagnosis not present

## 2024-05-19 DIAGNOSIS — R7303 Prediabetes: Secondary | ICD-10-CM | POA: Diagnosis not present

## 2024-05-19 DIAGNOSIS — E782 Mixed hyperlipidemia: Secondary | ICD-10-CM

## 2024-05-19 LAB — POCT GLYCOSYLATED HEMOGLOBIN (HGB A1C): HbA1c, POC (controlled diabetic range): 6.1 % (ref 0.0–7.0)

## 2024-05-19 MED ORDER — METFORMIN HCL ER 500 MG PO TB24: 500.0000 mg | ORAL_TABLET | Freq: Every day | ORAL | 1 refills | Status: DC

## 2024-05-19 NOTE — Patient Instructions (Signed)
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
## 2024-05-20 ENCOUNTER — Other Ambulatory Visit: Payer: Self-pay | Admitting: Medical

## 2024-05-20 ENCOUNTER — Ambulatory Visit: Admitting: "Endocrinology

## 2024-05-23 ENCOUNTER — Ambulatory Visit: Admitting: Physical Therapy

## 2024-06-06 ENCOUNTER — Encounter: Payer: Self-pay | Admitting: Medical

## 2024-06-07 ENCOUNTER — Ambulatory Visit (INDEPENDENT_AMBULATORY_CARE_PROVIDER_SITE_OTHER): Admitting: Medical

## 2024-06-07 ENCOUNTER — Other Ambulatory Visit (HOSPITAL_BASED_OUTPATIENT_CLINIC_OR_DEPARTMENT_OTHER): Payer: Self-pay

## 2024-06-07 ENCOUNTER — Encounter: Payer: Self-pay | Admitting: Medical

## 2024-06-07 VITALS — BP 130/86 | HR 86 | Ht 66.0 in | Wt 228.0 lb

## 2024-06-07 DIAGNOSIS — T148XXA Other injury of unspecified body region, initial encounter: Secondary | ICD-10-CM | POA: Diagnosis not present

## 2024-06-07 DIAGNOSIS — M7122 Synovial cyst of popliteal space [Baker], left knee: Secondary | ICD-10-CM

## 2024-06-07 DIAGNOSIS — K439 Ventral hernia without obstruction or gangrene: Secondary | ICD-10-CM

## 2024-06-07 DIAGNOSIS — E669 Obesity, unspecified: Secondary | ICD-10-CM

## 2024-06-07 DIAGNOSIS — R0683 Snoring: Secondary | ICD-10-CM

## 2024-06-07 MED ORDER — SEMAGLUTIDE-WEIGHT MANAGEMENT 0.25 MG/0.5ML ~~LOC~~ SOAJ
0.2500 mg | SUBCUTANEOUS | 0 refills | Status: DC
  Filled 2024-06-07: qty 2, 28d supply, fill #0

## 2024-06-07 NOTE — Patient Instructions (Signed)
 Chronic Bruising Persistent bruising for three months, primarily on medial knee. No trauma history. US  in past showed not dvt. Normal platelet count. Possible Repatha  side effect, vascular insufficiency, or low platelets. Small varicose veins present. - Order CBC to evaluate platelet count. - Consider vascular surgeon referral if varicose veins become tender or visible.  Ventral Hernia Ventral hernia history with prior surgery repair. Multiple previous surgeries. Small bulge located left of midline, possibly involving rectus abdominis fascia.  - Refer to general surgeon for evaluation and imaging consideration. - Advise immediate care if constant high-level pain occurs.  Baker's Cyst Asymptomatic Baker's cyst previously evaluated by orthopedist. Decision to monitor unless symptomatic or size increases. - Monitor for changes in size or symptoms.  Obesity Weight gain since August. Current weight 230 pounds, up from 203 pounds. Interested in weight loss medication. Potential benefit from CPAP if sleep apnea diagnosed. - Send Wegovy  prescription to pharmacy and check insurance coverage. - Refer to pulmonologist for sleep apnea evaluation.  Possible Sleep Apnea Reports of snoring, morning headaches, and unrested feeling suggest sleep apnea.  - Refer to pulmonologist for sleep apnea evaluation.  Prediabetes A1c increased from 5.4 to 6.1, indicating prediabetes.  - Monitor A1c levels.   Follow up in 3-4 weeks or sooner if needed
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
## 2024-06-08 ENCOUNTER — Ambulatory Visit: Payer: Self-pay | Admitting: Medical

## 2024-06-08 ENCOUNTER — Other Ambulatory Visit (HOSPITAL_COMMUNITY): Payer: Self-pay

## 2024-06-08 ENCOUNTER — Other Ambulatory Visit (HOSPITAL_BASED_OUTPATIENT_CLINIC_OR_DEPARTMENT_OTHER): Payer: Self-pay

## 2024-06-08 ENCOUNTER — Telehealth: Payer: Self-pay

## 2024-06-08 LAB — CBC WITH DIFFERENTIAL/PLATELET
Basophils Absolute: 0.1 10*3/uL (ref 0.0–0.1)
Basophils Relative: 0.8 % (ref 0.0–3.0)
Eosinophils Absolute: 0.1 10*3/uL (ref 0.0–0.7)
Eosinophils Relative: 1.3 % (ref 0.0–5.0)
HCT: 37.4 % (ref 36.0–46.0)
Hemoglobin: 11.9 g/dL — ABNORMAL LOW (ref 12.0–15.0)
Lymphocytes Relative: 36.3 % (ref 12.0–46.0)
Lymphs Abs: 3.5 10*3/uL (ref 0.7–4.0)
MCHC: 31.8 g/dL (ref 30.0–36.0)
MCV: 86.8 fl (ref 78.0–100.0)
Monocytes Absolute: 0.7 10*3/uL (ref 0.1–1.0)
Monocytes Relative: 7.3 % (ref 3.0–12.0)
Neutro Abs: 5.3 10*3/uL (ref 1.4–7.7)
Neutrophils Relative %: 54.3 % (ref 43.0–77.0)
Platelets: 351 10*3/uL (ref 150.0–400.0)
RBC: 4.31 Mil/uL (ref 3.87–5.11)
RDW: 15 % (ref 11.5–15.5)
WBC: 9.7 10*3/uL (ref 4.0–10.5)

## 2024-06-08 NOTE — Telephone Encounter (Signed)
 Pharmacy Patient Advocate Encounter   Received notification from CoverMyMeds that prior authorization for Wegovy  0.25 is required/requested.   Insurance verification completed.   The patient is insured through [facility] .   Per test claim: PA required; PA submitted to above mentioned insurance via CoverMyMeds Key/confirmation #/EOC [id number] Status is pending

## 2024-06-09 ENCOUNTER — Other Ambulatory Visit (HOSPITAL_BASED_OUTPATIENT_CLINIC_OR_DEPARTMENT_OTHER): Payer: Self-pay

## 2024-06-09 NOTE — Telephone Encounter (Signed)
 Pharmacy Patient Advocate Encounter  Received notification from [facility] that Prior Authorization for Wegovy  0.25 has been DENIED.  Full denial letter will be uploaded to the media tab. See denial reason below.   PA #/Case ID/Reference #: [id number]

## 2024-06-10 ENCOUNTER — Other Ambulatory Visit: Payer: Self-pay | Admitting: Medical

## 2024-06-22 ENCOUNTER — Other Ambulatory Visit (HOSPITAL_BASED_OUTPATIENT_CLINIC_OR_DEPARTMENT_OTHER): Payer: Self-pay

## 2024-06-22 ENCOUNTER — Encounter: Payer: Self-pay | Admitting: Urology

## 2024-06-22 ENCOUNTER — Ambulatory Visit (INDEPENDENT_AMBULATORY_CARE_PROVIDER_SITE_OTHER): Admitting: Urology

## 2024-06-22 VITALS — BP 120/77 | HR 80 | Ht 68.0 in | Wt 224.0 lb

## 2024-06-22 DIAGNOSIS — M0579 Rheumatoid arthritis with rheumatoid factor of multiple sites without organ or systems involvement: Secondary | ICD-10-CM | POA: Diagnosis not present

## 2024-06-22 DIAGNOSIS — Q6470 Unspecified congenital malformation of bladder and urethra: Secondary | ICD-10-CM | POA: Diagnosis not present

## 2024-06-22 DIAGNOSIS — N3281 Overactive bladder: Secondary | ICD-10-CM | POA: Insufficient documentation

## 2024-06-22 DIAGNOSIS — R3129 Other microscopic hematuria: Secondary | ICD-10-CM | POA: Diagnosis not present

## 2024-06-22 HISTORY — DX: Other microscopic hematuria: R31.29

## 2024-06-22 HISTORY — DX: Unspecified congenital malformation of bladder and urethra: Q64.70

## 2024-06-22 HISTORY — DX: Overactive bladder: N32.81

## 2024-06-22 LAB — URINALYSIS, ROUTINE W REFLEX MICROSCOPIC
Bilirubin, UA: NEGATIVE
Glucose, UA: NEGATIVE
Ketones, UA: NEGATIVE
Leukocytes,UA: NEGATIVE
Nitrite, UA: NEGATIVE
Protein,UA: NEGATIVE
Specific Gravity, UA: 1.015 (ref 1.005–1.030)
Urobilinogen, Ur: 0.2 mg/dL (ref 0.2–1.0)
pH, UA: 6 (ref 5.0–7.5)

## 2024-06-22 LAB — MICROSCOPIC EXAMINATION
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
## 2024-06-24 DIAGNOSIS — K432 Incisional hernia without obstruction or gangrene: Secondary | ICD-10-CM | POA: Diagnosis not present

## 2024-06-26 ENCOUNTER — Emergency Department (HOSPITAL_BASED_OUTPATIENT_CLINIC_OR_DEPARTMENT_OTHER)

## 2024-06-26 ENCOUNTER — Other Ambulatory Visit: Payer: Self-pay

## 2024-06-26 ENCOUNTER — Inpatient Hospital Stay (HOSPITAL_BASED_OUTPATIENT_CLINIC_OR_DEPARTMENT_OTHER)
Admission: EM | Admit: 2024-06-26 | Discharge: 2024-06-29 | DRG: 194 | Disposition: A | Discharge status: Home / Self Care | Attending: Internal Medicine | Admitting: Internal Medicine

## 2024-06-26 ENCOUNTER — Encounter (HOSPITAL_BASED_OUTPATIENT_CLINIC_OR_DEPARTMENT_OTHER): Payer: Self-pay

## 2024-06-26 DIAGNOSIS — Z833 Family history of diabetes mellitus: Secondary | ICD-10-CM

## 2024-06-26 DIAGNOSIS — R0789 Other chest pain: Secondary | ICD-10-CM | POA: Diagnosis not present

## 2024-06-26 DIAGNOSIS — M069 Rheumatoid arthritis, unspecified: Secondary | ICD-10-CM | POA: Diagnosis not present

## 2024-06-26 DIAGNOSIS — Z8619 Personal history of other infectious and parasitic diseases: Secondary | ICD-10-CM

## 2024-06-26 DIAGNOSIS — I517 Cardiomegaly: Secondary | ICD-10-CM | POA: Diagnosis not present

## 2024-06-26 DIAGNOSIS — Z79899 Other long term (current) drug therapy: Secondary | ICD-10-CM

## 2024-06-26 DIAGNOSIS — R091 Pleurisy: Principal | ICD-10-CM

## 2024-06-26 DIAGNOSIS — Q676 Pectus excavatum: Secondary | ICD-10-CM | POA: Diagnosis not present

## 2024-06-26 DIAGNOSIS — E782 Mixed hyperlipidemia: Secondary | ICD-10-CM | POA: Diagnosis present

## 2024-06-26 DIAGNOSIS — Z86711 Personal history of pulmonary embolism: Secondary | ICD-10-CM

## 2024-06-26 DIAGNOSIS — Z6832 Body mass index (BMI) 32.0-32.9, adult: Secondary | ICD-10-CM

## 2024-06-26 DIAGNOSIS — E6609 Other obesity due to excess calories: Secondary | ICD-10-CM

## 2024-06-26 DIAGNOSIS — I272 Pulmonary hypertension, unspecified: Secondary | ICD-10-CM | POA: Diagnosis present

## 2024-06-26 DIAGNOSIS — I10 Essential (primary) hypertension: Secondary | ICD-10-CM | POA: Diagnosis not present

## 2024-06-26 DIAGNOSIS — E871 Hypo-osmolality and hyponatremia: Secondary | ICD-10-CM | POA: Diagnosis present

## 2024-06-26 DIAGNOSIS — Z7985 Long-term (current) use of injectable non-insulin antidiabetic drugs: Secondary | ICD-10-CM

## 2024-06-26 DIAGNOSIS — Z8249 Family history of ischemic heart disease and other diseases of the circulatory system: Secondary | ICD-10-CM

## 2024-06-26 DIAGNOSIS — J929 Pleural plaque without asbestos: Secondary | ICD-10-CM | POA: Diagnosis not present

## 2024-06-26 DIAGNOSIS — R7303 Prediabetes: Secondary | ICD-10-CM | POA: Diagnosis present

## 2024-06-26 DIAGNOSIS — D638 Anemia in other chronic diseases classified elsewhere: Secondary | ICD-10-CM | POA: Diagnosis present

## 2024-06-26 DIAGNOSIS — H8102 Meniere's disease, left ear: Secondary | ICD-10-CM | POA: Diagnosis present

## 2024-06-26 DIAGNOSIS — K219 Gastro-esophageal reflux disease without esophagitis: Secondary | ICD-10-CM | POA: Diagnosis not present

## 2024-06-26 DIAGNOSIS — R079 Chest pain, unspecified: Secondary | ICD-10-CM | POA: Diagnosis not present

## 2024-06-26 DIAGNOSIS — Z6834 Body mass index (BMI) 34.0-34.9, adult: Secondary | ICD-10-CM

## 2024-06-26 DIAGNOSIS — E66811 Obesity, class 1: Secondary | ICD-10-CM | POA: Diagnosis not present

## 2024-06-26 DIAGNOSIS — J189 Pneumonia, unspecified organism: Principal | ICD-10-CM | POA: Diagnosis present

## 2024-06-26 DIAGNOSIS — G4733 Obstructive sleep apnea (adult) (pediatric): Secondary | ICD-10-CM | POA: Diagnosis present

## 2024-06-26 DIAGNOSIS — Z801 Family history of malignant neoplasm of trachea, bronchus and lung: Secondary | ICD-10-CM

## 2024-06-26 DIAGNOSIS — F4310 Post-traumatic stress disorder, unspecified: Secondary | ICD-10-CM | POA: Diagnosis present

## 2024-06-26 DIAGNOSIS — Z9884 Bariatric surgery status: Secondary | ICD-10-CM

## 2024-06-26 DIAGNOSIS — R0989 Other specified symptoms and signs involving the circulatory and respiratory systems: Secondary | ICD-10-CM | POA: Diagnosis not present

## 2024-06-26 DIAGNOSIS — Z803 Family history of malignant neoplasm of breast: Secondary | ICD-10-CM

## 2024-06-26 DIAGNOSIS — Z86718 Personal history of other venous thrombosis and embolism: Secondary | ICD-10-CM

## 2024-06-26 DIAGNOSIS — M3500 Sicca syndrome, unspecified: Secondary | ICD-10-CM | POA: Diagnosis not present

## 2024-06-26 DIAGNOSIS — J181 Lobar pneumonia, unspecified organism: Secondary | ICD-10-CM | POA: Diagnosis not present

## 2024-06-26 DIAGNOSIS — H269 Unspecified cataract: Secondary | ICD-10-CM | POA: Diagnosis present

## 2024-06-26 DIAGNOSIS — H9190 Unspecified hearing loss, unspecified ear: Secondary | ICD-10-CM | POA: Diagnosis present

## 2024-06-26 DIAGNOSIS — D649 Anemia, unspecified: Secondary | ICD-10-CM | POA: Diagnosis not present

## 2024-06-26 DIAGNOSIS — Z7984 Long term (current) use of oral hypoglycemic drugs: Secondary | ICD-10-CM

## 2024-06-26 DIAGNOSIS — Z90710 Acquired absence of both cervix and uterus: Secondary | ICD-10-CM

## 2024-06-26 DIAGNOSIS — I700 Atherosclerosis of aorta: Secondary | ICD-10-CM | POA: Diagnosis not present

## 2024-06-26 DIAGNOSIS — G4730 Sleep apnea, unspecified: Secondary | ICD-10-CM | POA: Diagnosis not present

## 2024-06-26 DIAGNOSIS — D571 Sickle-cell disease without crisis: Secondary | ICD-10-CM | POA: Diagnosis present

## 2024-06-26 DIAGNOSIS — Z888 Allergy status to other drugs, medicaments and biological substances status: Secondary | ICD-10-CM

## 2024-06-26 DIAGNOSIS — Z79631 Long term (current) use of antimetabolite agent: Secondary | ICD-10-CM

## 2024-06-26 DIAGNOSIS — H8101 Meniere's disease, right ear: Secondary | ICD-10-CM | POA: Diagnosis present

## 2024-06-26 HISTORY — DX: Pneumonia, unspecified organism: J18.9

## 2024-06-26 LAB — CBC
HCT: 36.2 % (ref 36.0–46.0)
Hemoglobin: 11.8 g/dL — ABNORMAL LOW (ref 12.0–15.0)
MCH: 28 pg (ref 26.0–34.0)
MCHC: 32.6 g/dL (ref 30.0–36.0)
MCV: 85.8 fL (ref 80.0–100.0)
Platelets: 358 K/uL (ref 150–400)
RBC: 4.22 MIL/uL (ref 3.87–5.11)
RDW: 14 % (ref 11.5–15.5)
WBC: 11.1 K/uL — ABNORMAL HIGH (ref 4.0–10.5)
nRBC: 0 % (ref 0.0–0.2)

## 2024-06-26 LAB — BASIC METABOLIC PANEL WITH GFR
Anion gap: 11 (ref 5–15)
BUN: 18 mg/dL (ref 8–23)
CO2: 26 mmol/L (ref 22–32)
Calcium: 9.3 mg/dL (ref 8.9–10.3)
Chloride: 103 mmol/L (ref 98–111)
Creatinine, Ser: 0.95 mg/dL (ref 0.44–1.00)
GFR, Estimated: >60 mL/min (ref >60)
Glucose, Bld: 98 mg/dL (ref 70–99)
Potassium: 3.7 mmol/L (ref 3.5–5.1)
Sodium: 140 mmol/L (ref 135–145)

## 2024-06-26 LAB — D-DIMER, QUANTITATIVE: D-Dimer, Quant: 1.42 ug{FEU}/mL — ABNORMAL HIGH (ref 0.00–0.50)

## 2024-06-26 LAB — TROPONIN T, HIGH SENSITIVITY
Troponin T High Sensitivity: <15 ng/L (ref <19)
Troponin T High Sensitivity: 11 ng/L (ref <19)

## 2024-06-26 LAB — PROCALCITONIN: Procalcitonin: <0.1 ng/mL

## 2024-06-26 LAB — HIV ANTIBODY (ROUTINE TESTING W REFLEX): HIV Screen 4th Generation wRfx: NONREACTIVE

## 2024-06-26 MED ORDER — HYDROMORPHONE HCL 1 MG/ML IJ SOLN
0.5000 mg | Freq: Once | INTRAMUSCULAR | Status: AC
  Administered 2024-06-26: 0.5 mg via INTRAVENOUS
  Filled 2024-06-26: qty 1

## 2024-06-26 MED ORDER — KETOROLAC TROMETHAMINE 15 MG/ML IJ SOLN
15.0000 mg | Freq: Four times a day (QID) | INTRAMUSCULAR | Status: DC | PRN
  Administered 2024-06-26 – 2024-06-29 (×7): 15 mg via INTRAVENOUS
  Filled 2024-06-26 (×8): qty 1

## 2024-06-26 MED ORDER — SODIUM CHLORIDE 0.9 % IV SOLN: INTRAVENOUS | Status: AC | PRN

## 2024-06-26 MED ORDER — IRBESARTAN 300 MG PO TABS
300.0000 mg | ORAL_TABLET | Freq: Every day | ORAL | Status: DC
  Administered 2024-06-27 – 2024-06-29 (×3): 300 mg via ORAL
  Filled 2024-06-26 (×3): qty 1

## 2024-06-26 MED ORDER — PANTOPRAZOLE SODIUM 40 MG PO TBEC
40.0000 mg | DELAYED_RELEASE_TABLET | Freq: Every day | ORAL | Status: DC
Start: 2024-06-27 — End: 2024-06-29
  Administered 2024-06-27 – 2024-06-29 (×3): 40 mg via ORAL
  Filled 2024-06-26 (×3): qty 1

## 2024-06-26 MED ORDER — SODIUM CHLORIDE 0.9 % IV SOLN
1.0000 g | Freq: Once | INTRAVENOUS | Status: AC
  Administered 2024-06-26: 1 g via INTRAVENOUS
  Filled 2024-06-26: qty 10

## 2024-06-26 MED ORDER — ACETAMINOPHEN 650 MG RE SUPP: 650.0000 mg | Freq: Four times a day (QID) | RECTAL | Status: DC | PRN

## 2024-06-26 MED ORDER — SODIUM CHLORIDE 0.9 % IV SOLN
2.0000 g | INTRAVENOUS | Status: DC
  Administered 2024-06-27 – 2024-06-29 (×3): 2 g via INTRAVENOUS
  Filled 2024-06-26 (×3): qty 20

## 2024-06-26 MED ORDER — ONDANSETRON HCL 4 MG/2ML IJ SOLN
4.0000 mg | Freq: Once | INTRAMUSCULAR | Status: AC
  Administered 2024-06-26: 4 mg via INTRAVENOUS
  Filled 2024-06-26: qty 2

## 2024-06-26 MED ORDER — LIDOCAINE 5 % EX PTCH
1.0000 | MEDICATED_PATCH | CUTANEOUS | Status: DC
  Administered 2024-06-26 – 2024-06-29 (×4): 1 via TRANSDERMAL
  Filled 2024-06-26 (×5): qty 1

## 2024-06-26 MED ORDER — SODIUM CHLORIDE 0.9% FLUSH
3.0000 mL | Freq: Two times a day (BID) | INTRAVENOUS | Status: DC
  Administered 2024-06-26 – 2024-06-29 (×6): 3 mL via INTRAVENOUS

## 2024-06-26 MED ORDER — METFORMIN HCL ER 500 MG PO TB24: 500.0000 mg | ORAL_TABLET | Freq: Every day | ORAL | Status: DC

## 2024-06-26 MED ORDER — BUPROPION HCL ER (XL) 150 MG PO TB24
300.0000 mg | ORAL_TABLET | Freq: Every day | ORAL | Status: DC
Start: 2024-06-27 — End: 2024-06-29
  Administered 2024-06-27 – 2024-06-29 (×3): 300 mg via ORAL
  Filled 2024-06-26 (×3): qty 2

## 2024-06-26 MED ORDER — ASPIRIN 81 MG PO CHEW
324.0000 mg | CHEWABLE_TABLET | Freq: Once | ORAL | Status: AC
  Administered 2024-06-26: 324 mg via ORAL
  Filled 2024-06-26: qty 4

## 2024-06-26 MED ORDER — DIPHENHYDRAMINE HCL 25 MG PO CAPS: 50.0000 mg | ORAL_CAPSULE | Freq: Once | ORAL | Status: DC

## 2024-06-26 MED ORDER — ACETAMINOPHEN 500 MG PO TABS
1000.0000 mg | ORAL_TABLET | Freq: Once | ORAL | Status: AC
  Administered 2024-06-26: 1000 mg via ORAL
  Filled 2024-06-26: qty 2

## 2024-06-26 MED ORDER — OXYCODONE HCL 5 MG PO TABS
5.0000 mg | ORAL_TABLET | ORAL | Status: AC
  Administered 2024-06-26: 5 mg via ORAL
  Filled 2024-06-26: qty 1

## 2024-06-26 MED ORDER — DOXYCYCLINE HYCLATE 100 MG PO TABS
100.0000 mg | ORAL_TABLET | Freq: Once | ORAL | Status: AC
  Administered 2024-06-26: 100 mg via ORAL
  Filled 2024-06-26: qty 1

## 2024-06-26 MED ORDER — ENOXAPARIN SODIUM 40 MG/0.4ML IJ SOSY
40.0000 mg | PREFILLED_SYRINGE | INTRAMUSCULAR | Status: DC
  Administered 2024-06-26 – 2024-06-28 (×3): 40 mg via SUBCUTANEOUS
  Filled 2024-06-26 (×3): qty 0.4

## 2024-06-26 MED ORDER — KETOROLAC TROMETHAMINE 15 MG/ML IJ SOLN
15.0000 mg | Freq: Once | INTRAMUSCULAR | Status: AC
  Administered 2024-06-26: 15 mg via INTRAVENOUS
  Filled 2024-06-26: qty 1

## 2024-06-26 MED ORDER — METHYLPREDNISOLONE SODIUM SUCC 40 MG IJ SOLR: 40.0000 mg | Freq: Once | INTRAMUSCULAR | Status: DC

## 2024-06-26 MED ORDER — DOXYCYCLINE HYCLATE 100 MG PO TABS
100.0000 mg | ORAL_TABLET | Freq: Two times a day (BID) | ORAL | Status: DC
  Administered 2024-06-26 – 2024-06-29 (×6): 100 mg via ORAL
  Filled 2024-06-26 (×6): qty 1

## 2024-06-26 MED ORDER — DIPHENHYDRAMINE HCL 50 MG/ML IJ SOLN: 50.0000 mg | Freq: Once | INTRAMUSCULAR | Status: DC

## 2024-06-26 MED ORDER — ACETAMINOPHEN 325 MG PO TABS
650.0000 mg | ORAL_TABLET | Freq: Four times a day (QID) | ORAL | Status: DC | PRN
  Administered 2024-06-26: 650 mg via ORAL
  Filled 2024-06-26: qty 2

## 2024-06-26 MED ORDER — IOHEXOL 350 MG/ML SOLN
100.0000 mL | Freq: Once | INTRAVENOUS | Status: AC | PRN
  Administered 2024-06-26: 75 mL via INTRAVENOUS

## 2024-06-26 NOTE — ED Triage Notes (Addendum)
 Onset 45 minutes ago. Middle chest and radiates to right wrist and back. 7/10 stabbing pain that worsens with a deep breath. Denies shortness of breath, nausea, and dizziness. Took indigestion meds with no relief PTA.

## 2024-06-26 NOTE — ED Notes (Signed)
 Patient transported to X-ray

## 2024-06-26 NOTE — ED Provider Notes (Signed)
 [facility] Provider Note   CSN: [id number] Arrival date & time: 06/26/24  9198     Patient presents with: Chest Pain   Judy [name] is a 73 y.o. female.  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  Last night at 3 AM started experiencing right-sided chest pain.  Sharp and stabbing.  Radiates down her right arm.  Worsened with moving her arm and deep breath.  Not exertional.  No diaphoresis since the chest pain is started.  Nausea but no vomiting.  Mild shortness of breath due to the pain.  Had a blood clot years ago after she had a surgery but is no longer on anticoagulation.       Prior to Admission medications   Medication Sig Start Date End Date Taking? Authorizing Provider  buPROPion  (WELLBUTRIN  XL) 300 MG 24 hr tablet TAKE 1 TABLET BY MOUTH EVERY DAY 04/04/24   Saguier, [place], PA-C  candesartan  (ATACAND ) 32 MG tablet Take 1 tablet (32 mg total) by mouth daily. 06/13/24   Saguier, [place], PA-C  celecoxib  (CELEBREX ) 200 MG capsule Take 1 capsule (200 mg total) by mouth 2 (two) times daily. Patient not taking: Reported on 06/22/2024 06/30/23   Cleatrice Ludie SAUNDERS, MD  Cholecalciferol (VITAMIN D ) 1000 UNITS capsule Take 1,000 Units by mouth daily.    [provider]  cyclobenzaprine  (FLEXERIL ) 10 MG tablet TAKE 1 TABLET BY MOUTH THREE TIMES A DAY AS NEEDED 12/18/22   Chick Venetia BRAVO, MD  Dietary Management Product (RHEUMATE) CAPS Take 1 capsule by mouth daily. 05/29/22   [provider]  Evolocumab  (REPATHA  SURECLICK) 140 MG/ML SOAJ INJECT 140 MG INTO THE SKIN EVERY 14 (FOURTEEN) DAYS. Patient not taking: Reported on 06/22/2024 04/29/24   Nida, Gebreselassie W, MD  Evolocumab  (REPATHA ) 140 MG/ML SOSY Inject 140 mg into the skin every  14 (fourteen) days. 01/19/24   Nida, Gebreselassie W, MD  famotidine  (PEPCID ) 20 MG tablet TAKE 1 TABLET BY MOUTH EVERY DAY 05/20/24   Saguier, Dallas, PA-C  inFLIXimab -abda (RENFLEXIS ) 100 MG SOLR Inject 100 mg into the vein every 8 (eight) weeks. Remicaid infusion every 8 weeks for RA    [provider]  metFORMIN  (GLUCOPHAGE -XR) 500 MG 24 hr tablet Take 1 tablet (500 mg total) by mouth daily after breakfast. 05/19/24   Nida, Gebreselassie W, MD  methotrexate  50 MG/2ML injection Inject 25 mg into the skin once a week.    [provider]  Multiple Vitamin (QUINTABS) TABS Take 1 tablet by mouth daily. 06/13/19   [provider]  nystatin -triamcinolone  (MYCOLOG II) cream Apply 1 application topically 2 (two) times daily. 01/21/21   Saguier, Dallas, PA-C  pantoprazole  (PROTONIX ) 40 MG tablet TAKE 1 TABLET BY MOUTH EVERY DAY 09/15/23   Saguier, Dallas, PA-C  Semaglutide -Weight Management 0.25 MG/0.5ML SOAJ Inject 0.25 mg into the skin once a week. 06/07/24   Saguier, Dallas, PA-C  solifenacin  (VESICARE ) 10 MG tablet Take 1 tablet (10 mg total) by mouth daily. Patient not taking: Reported on 06/22/2024 11/22/23   Shona Layman BROCKS, MD    Allergies: Contrast media [iodinated contrast media], Ioxaglate, Metrizamide, and Atorvastatin    Review of Systems  Updated Vital Signs BP 138/62   Pulse 89   Temp 98.9 F (37.2 C) (Oral)   Resp 14   Ht  5' 8 (1.727 m)   Wt 101.6 kg   SpO2 100%   BMI 34.06 kg/m   Physical Exam Vitals and nursing note reviewed.  Constitutional:      General: She is not in acute distress.    Appearance: She is well-developed.  HENT:     Head: Normocephalic and atraumatic.     Right Ear: External ear normal.     Left Ear: External ear normal.     Nose: Nose normal.  Eyes:     Extraocular Movements: Extraocular movements intact.     Conjunctiva/sclera: Conjunctivae normal.     Pupils: Pupils are equal, round, and reactive to light.  Cardiovascular:      Rate and Rhythm: Normal rate and regular rhythm.     Heart sounds: No murmur heard.    Comments: Chest pain not reproducible.  No overlying rashes.  Radial pulses 2+ bilaterally. Pulmonary:     Effort: Pulmonary effort is normal. No respiratory distress.     Breath sounds: Normal breath sounds.  Musculoskeletal:     Cervical back: Normal range of motion and neck supple.     Right lower leg: No edema.     Left lower leg: No edema.  Skin:    General: Skin is warm and dry.  Neurological:     Mental Status: She is alert and oriented to person, place, and time. Mental status is at baseline.  Psychiatric:        Mood and Affect: Mood normal.     (all labs ordered are listed, but only abnormal results are displayed) Labs Reviewed  BASIC METABOLIC PANEL WITH GFR  CBC  D-DIMER, QUANTITATIVE  TROPONIN T, HIGH SENSITIVITY    EKG: None  Radiology: No results found.  {Document cardiac monitor, telemetry assessment procedure when appropriate:32947} Procedures   Medications Ordered in the ED  aspirin  chewable tablet 324 mg (has no administration in time range)      {Click here for ABCD2, HEART and other calculators REFRESH Note before signing:1}                              Medical Decision Making Amount and/or Complexity of Data Reviewed Labs: ordered. Radiology: ordered.  Risk OTC drugs. Prescription drug management.   ***Reevaluated the patient.  Did explain to her that her CT scan is showing possible lung infection.  She reports that she has been feeling tired and exhausted with mild shortness of breath and a cough.  Did have flulike symptoms several weeks ago as well.  {Document critical care time when appropriate  Document review of labs and clinical decision tools ie CHADS2VASC2, etc  Document your independent review of radiology images and any outside records  Document your discussion with family members, caretakers and with consultants  Document social  determinants of health affecting pt's care  Document your decision making why or why not admission, treatments were needed:32947:::1}   Final diagnoses:  None    ED Discharge Orders     None

## 2024-06-26 NOTE — ED Notes (Signed)
 Patient transported to CT

## 2024-06-26 NOTE — ED Notes (Signed)
 ED Provider at bedside.

## 2024-06-26 NOTE — ED Notes (Signed)
[facility] at bedside to assume care of patient. 
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
## 2024-06-27 ENCOUNTER — Encounter: Payer: Self-pay | Admitting: Medical

## 2024-06-27 ENCOUNTER — Observation Stay (HOSPITAL_COMMUNITY)

## 2024-06-27 DIAGNOSIS — J189 Pneumonia, unspecified organism: Secondary | ICD-10-CM | POA: Diagnosis not present

## 2024-06-27 DIAGNOSIS — R079 Chest pain, unspecified: Secondary | ICD-10-CM | POA: Diagnosis not present

## 2024-06-27 LAB — ECHOCARDIOGRAM COMPLETE
Area-P 1/2: 3.16 cm2
Height: 68 in
S' Lateral: 3.7 cm
Weight: 3584 [oz_av]

## 2024-06-27 LAB — COMPREHENSIVE METABOLIC PANEL WITH GFR
ALT: 11 U/L (ref 0–44)
AST: 15 U/L (ref 15–41)
Albumin: 2.9 g/dL — ABNORMAL LOW (ref 3.5–5.0)
Alkaline Phosphatase: 65 U/L (ref 38–126)
Anion gap: 11 (ref 5–15)
BUN: 14 mg/dL (ref 8–23)
CO2: 24 mmol/L (ref 22–32)
Calcium: 8.5 mg/dL — ABNORMAL LOW (ref 8.9–10.3)
Chloride: 101 mmol/L (ref 98–111)
Creatinine, Ser: 1.17 mg/dL — ABNORMAL HIGH (ref 0.44–1.00)
GFR, Estimated: 50 mL/min — ABNORMAL LOW (ref >60)
Glucose, Bld: 92 mg/dL (ref 70–99)
Potassium: 3.9 mmol/L (ref 3.5–5.1)
Sodium: 136 mmol/L (ref 135–145)
Total Bilirubin: 1 mg/dL (ref 0.0–1.2)
Total Protein: 6.5 g/dL (ref 6.5–8.1)

## 2024-06-27 LAB — CBC
HCT: 34.1 % — ABNORMAL LOW (ref 36.0–46.0)
Hemoglobin: 11 g/dL — ABNORMAL LOW (ref 12.0–15.0)
MCH: 28.1 pg (ref 26.0–34.0)
MCHC: 32.3 g/dL (ref 30.0–36.0)
MCV: 87 fL (ref 80.0–100.0)
Platelets: 296 K/uL (ref 150–400)
RBC: 3.92 MIL/uL (ref 3.87–5.11)
RDW: 14 % (ref 11.5–15.5)
WBC: 9.8 K/uL (ref 4.0–10.5)
nRBC: 0 % (ref 0.0–0.2)

## 2024-06-27 MED ORDER — MORPHINE SULFATE (PF) 2 MG/ML IV SOLN
1.0000 mg | INTRAVENOUS | Status: DC | PRN
  Administered 2024-06-27 – 2024-06-29 (×6): 1 mg via INTRAVENOUS
  Filled 2024-06-27 (×6): qty 1

## 2024-06-27 NOTE — Progress Notes (Signed)
 Echocardiogram 2D Echocardiogram has been performed.  Damien FALCON [name] RDCS 06/27/2024, 1:59 PM

## 2024-06-27 NOTE — Progress Notes (Signed)
   06/27/24 2103  BiPAP/CPAP/SIPAP  BiPAP/CPAP/SIPAP Pt Type Adult  BiPAP/CPAP/SIPAP Resmed  Reason BIPAP/CPAP not in use Non-compliant  Mask Type Full face mask  Dentures removed? Not applicable  EPAP 5 cmH2O  PEEP 5 cmH20  FiO2 (%) 21 %  Patient Home Machine No  Patient Home Mask No  Patient Home Tubing No  Auto Titrate No  CPAP/SIPAP surface wiped down Yes  Device Plugged into RED Power Outlet Yes  BiPAP/CPAP /SiPAP Vitals  Temp 98.3 F (36.8 C)  Pulse Rate 76  Resp 18  BP (!) 139/49  SpO2 97 %  MEWS Score/Color  MEWS Score 0  MEWS Score Color Green

## 2024-06-27 NOTE — Progress Notes (Signed)
  Progress Note   Patient: Judy [name] FMW:[id number] DOB: March 03, 1951 DOA: 06/26/2024     0 DOS: the patient was seen and examined on 06/27/2024    Assessment and Plan: Chest pain from CAP  - ECHO ordered 06/27/2024 - IV ceftriaxone  2 g daily  - Doxycyline 100 mg PO q12  - Cardiac monitoring  - IV morphine  1 mg q4 hr PRN  - Toradol  15 mg q6 hr PRN  - Lidoderm  5% patch   HTN - Avapro  300 mg PO daily   HLD - On repatha  as an outpatient   GERD - Protonix  40 mg PO daily   Anemia  - Monitor   Prediabetes  - Monitor   Obesity  - BMI 34.06 kg/m2 - Complicating care   Sjogren syndrome & RA  - On methotrexate  and Renflexis  as an outpt  OSA  - CPAP  Subjective: Pt seen and examined at the bedside. WBC down trending 11.1 -->9.8. Pain was not controlled with toradol  alone, thus, IV morphine  PRN ordered today.  ECHO ordered today for completion. Troponin's remain negative.   Physical Exam: Vitals:   06/26/24 1457 06/26/24 1941 06/27/24 0427 06/27/24 0747  BP: (!) 121/49 (!) 143/59 (!) 142/77 (!) 110/48  Pulse: 80 80 91 85  Resp: 18 18 16 19   Temp: 98.3 F (36.8 C) 98.4 F (36.9 C) 99.8 F (37.7 C) 98.8 F (37.1 C)  TempSrc: Oral  Oral Oral  SpO2: 94% 96% 95% 96%  Weight:      Height:       Physical Exam HENT:     Head: Normocephalic.  Cardiovascular:     Rate and Rhythm: Normal rate.  Pulmonary:     Effort: Pulmonary effort is normal.  Abdominal:     Palpations: Abdomen is soft.  Musculoskeletal:        General: Normal range of motion.  Skin:    General: Skin is warm.  Neurological:     Mental Status: She is alert and oriented to person, place, and time.  Psychiatric:        Mood and Affect: Mood normal.      Disposition: Status is: Observation The patient remains OBS appropriate and will d/c before 2 midnights.  Planned Discharge Destination: Home    Time spent: 35 minutes  Author: Sharise Lippy , MD 06/27/2024 10:19 AM  For on call review  [email or web address].

## 2024-06-27 NOTE — Plan of Care (Signed)
   Problem: Activity: Goal: Ability to tolerate increased activity will improve Outcome: Progressing   Problem: Clinical Measurements: Goal: Ability to maintain a body temperature in the normal range will improve Outcome: Progressing

## 2024-06-27 NOTE — Plan of Care (Signed)

## 2024-06-27 NOTE — Progress Notes (Signed)
 Mobility Specialist Progress Note:    06/27/24 1421  Mobility  Activity Ambulated independently in hallway  Level of Assistance Independent  Assistive Device None  Distance Ambulated (ft) 600 ft  Activity Response Tolerated well  Mobility Referral Yes  Mobility visit 1 Mobility  Mobility Specialist Start Time (ACUTE ONLY) 1421  Mobility Specialist Stop Time (ACUTE ONLY) 1433  Mobility Specialist Time Calculation (min) (ACUTE ONLY) 12 min   Pt agreeable to session. Felt good walking around because she is normally active. No c/o any symptoms besides the pain that brought her in. Left pt on EOB w/ son in room and all needs met.   Judy [name] Mobility Specialist Please Neurosurgeon or Rehab Office at [phone]

## 2024-06-28 ENCOUNTER — Encounter (HOSPITAL_BASED_OUTPATIENT_CLINIC_OR_DEPARTMENT_OTHER): Payer: Self-pay | Admitting: Pulmonary Disease

## 2024-06-28 DIAGNOSIS — Z79631 Long term (current) use of antimetabolite agent: Secondary | ICD-10-CM | POA: Diagnosis not present

## 2024-06-28 DIAGNOSIS — Z8249 Family history of ischemic heart disease and other diseases of the circulatory system: Secondary | ICD-10-CM | POA: Diagnosis not present

## 2024-06-28 DIAGNOSIS — M069 Rheumatoid arthritis, unspecified: Secondary | ICD-10-CM | POA: Diagnosis not present

## 2024-06-28 DIAGNOSIS — I10 Essential (primary) hypertension: Secondary | ICD-10-CM | POA: Diagnosis not present

## 2024-06-28 DIAGNOSIS — D638 Anemia in other chronic diseases classified elsewhere: Secondary | ICD-10-CM | POA: Diagnosis not present

## 2024-06-28 DIAGNOSIS — R091 Pleurisy: Secondary | ICD-10-CM

## 2024-06-28 DIAGNOSIS — Z6832 Body mass index (BMI) 32.0-32.9, adult: Secondary | ICD-10-CM | POA: Diagnosis not present

## 2024-06-28 DIAGNOSIS — Z9884 Bariatric surgery status: Secondary | ICD-10-CM | POA: Diagnosis not present

## 2024-06-28 DIAGNOSIS — K2100 Gastro-esophageal reflux disease with esophagitis, without bleeding: Secondary | ICD-10-CM

## 2024-06-28 DIAGNOSIS — E66811 Obesity, class 1: Secondary | ICD-10-CM | POA: Diagnosis not present

## 2024-06-28 DIAGNOSIS — E6609 Other obesity due to excess calories: Secondary | ICD-10-CM | POA: Diagnosis not present

## 2024-06-28 DIAGNOSIS — K219 Gastro-esophageal reflux disease without esophagitis: Secondary | ICD-10-CM | POA: Diagnosis not present

## 2024-06-28 DIAGNOSIS — M3500 Sicca syndrome, unspecified: Secondary | ICD-10-CM | POA: Diagnosis not present

## 2024-06-28 DIAGNOSIS — E871 Hypo-osmolality and hyponatremia: Secondary | ICD-10-CM | POA: Diagnosis not present

## 2024-06-28 DIAGNOSIS — Z86711 Personal history of pulmonary embolism: Secondary | ICD-10-CM | POA: Diagnosis not present

## 2024-06-28 DIAGNOSIS — Z888 Allergy status to other drugs, medicaments and biological substances status: Secondary | ICD-10-CM | POA: Diagnosis not present

## 2024-06-28 DIAGNOSIS — Z6834 Body mass index (BMI) 34.0-34.9, adult: Secondary | ICD-10-CM | POA: Diagnosis not present

## 2024-06-28 DIAGNOSIS — E782 Mixed hyperlipidemia: Secondary | ICD-10-CM | POA: Diagnosis not present

## 2024-06-28 DIAGNOSIS — R7303 Prediabetes: Secondary | ICD-10-CM | POA: Diagnosis not present

## 2024-06-28 DIAGNOSIS — Z79899 Other long term (current) drug therapy: Secondary | ICD-10-CM | POA: Diagnosis not present

## 2024-06-28 DIAGNOSIS — D571 Sickle-cell disease without crisis: Secondary | ICD-10-CM | POA: Diagnosis not present

## 2024-06-28 DIAGNOSIS — Z833 Family history of diabetes mellitus: Secondary | ICD-10-CM | POA: Diagnosis not present

## 2024-06-28 DIAGNOSIS — Z86718 Personal history of other venous thrombosis and embolism: Secondary | ICD-10-CM | POA: Diagnosis not present

## 2024-06-28 DIAGNOSIS — Z7984 Long term (current) use of oral hypoglycemic drugs: Secondary | ICD-10-CM | POA: Diagnosis not present

## 2024-06-28 DIAGNOSIS — I272 Pulmonary hypertension, unspecified: Secondary | ICD-10-CM | POA: Diagnosis not present

## 2024-06-28 DIAGNOSIS — J189 Pneumonia, unspecified organism: Secondary | ICD-10-CM | POA: Diagnosis not present

## 2024-06-28 DIAGNOSIS — G4733 Obstructive sleep apnea (adult) (pediatric): Secondary | ICD-10-CM | POA: Diagnosis not present

## 2024-06-28 DIAGNOSIS — Z7985 Long-term (current) use of injectable non-insulin antidiabetic drugs: Secondary | ICD-10-CM | POA: Diagnosis not present

## 2024-06-28 LAB — COMPREHENSIVE METABOLIC PANEL WITH GFR
ALT: 11 U/L (ref 0–44)
AST: 14 U/L — ABNORMAL LOW (ref 15–41)
Albumin: 2.7 g/dL — ABNORMAL LOW (ref 3.5–5.0)
Alkaline Phosphatase: 58 U/L (ref 38–126)
Anion gap: 8 (ref 5–15)
BUN: 14 mg/dL (ref 8–23)
CO2: 24 mmol/L (ref 22–32)
Calcium: 8.5 mg/dL — ABNORMAL LOW (ref 8.9–10.3)
Chloride: 102 mmol/L (ref 98–111)
Creatinine, Ser: 1.1 mg/dL — ABNORMAL HIGH (ref 0.44–1.00)
GFR, Estimated: 54 mL/min — ABNORMAL LOW (ref >60)
Glucose, Bld: 92 mg/dL (ref 70–99)
Potassium: 3.7 mmol/L (ref 3.5–5.1)
Sodium: 134 mmol/L — ABNORMAL LOW (ref 135–145)
Total Bilirubin: 0.8 mg/dL (ref 0.0–1.2)
Total Protein: 6.1 g/dL — ABNORMAL LOW (ref 6.5–8.1)

## 2024-06-28 LAB — CBC
HCT: 32.7 % — ABNORMAL LOW (ref 36.0–46.0)
Hemoglobin: 10.6 g/dL — ABNORMAL LOW (ref 12.0–15.0)
MCH: 28 pg (ref 26.0–34.0)
MCHC: 32.4 g/dL (ref 30.0–36.0)
MCV: 86.5 fL (ref 80.0–100.0)
Platelets: 297 K/uL (ref 150–400)
RBC: 3.78 MIL/uL — ABNORMAL LOW (ref 3.87–5.11)
RDW: 13.9 % (ref 11.5–15.5)
WBC: 7.6 K/uL (ref 4.0–10.5)
nRBC: 0 % (ref 0.0–0.2)

## 2024-06-28 LAB — C-REACTIVE PROTEIN: CRP: 10.2 mg/dL — ABNORMAL HIGH (ref <1.0)

## 2024-06-28 LAB — MAGNESIUM: Magnesium: 1.9 mg/dL (ref 1.7–2.4)

## 2024-06-28 NOTE — Progress Notes (Signed)
 Mobility Specialist Progress Note:    06/28/24 1430  Mobility  Activity Ambulated independently in hallway  Level of Assistance Independent  Assistive Device None  Distance Ambulated (ft) 600 ft  Activity Response Tolerated well  Mobility Referral No  Mobility visit 1 Mobility  Mobility Specialist Start Time (ACUTE ONLY) 1430  Mobility Specialist Stop Time (ACUTE ONLY) 1435  Mobility Specialist Time Calculation (min) (ACUTE ONLY) 5 min   Received pt ambulating. Pt moving well and feeling a lot better w/ reduced pain. No c/o any symptoms. Left pt ambulating and all needs met. No need for MS  Venetia Keel Mobility Specialist Please Neurosurgeon or Rehab Office at [phone]

## 2024-06-28 NOTE — Plan of Care (Signed)

## 2024-06-28 NOTE — Progress Notes (Signed)
 [facility]                                                                              Judy [name], is a 73 y.o. female, DOB - [birth year], FMW:[id number] Admit date - 06/26/2024    Outpatient Primary MD for the patient is Saguier, [place], PA-C  LOS - 0  days  Chief Complaint  Patient presents with   Chest Pain       Brief summary   Patient is a 73 year old female with HTN, hyperlipidemia, GERD, anemia, prediabetes status post sleeve gastrectomy, OSA, obesity, Sjogren's syndrome, RA, Mnire's disease, sickle cell trait, PE presented with chest pain.  Patient reported sudden onset of chest pain around 3 AM when she got up to use the bathroom, reoccurred and was worse later in the morning.  Described as a right-sided, sharp and stabbing, radiating down her right arm, worse with moving and deep breathing.  She reported that she had some preceding fatigue, mild shortness of breath and cough, flulike symptoms 2 weeks ago. No fevers  In ED with signs stable.  CTA was negative for PE but showed right lower lobe masslike distortion measuring 3.7x 1.9 cm with adjacent pleural thickening favoring infectious versus inflammatory etiology, focal consolidation of the Nangle also favoring infectious versus inflammatory etiology.   Patient was admitted for further workup  Assessment & Plan     CAP pneumonia with atypical chest pain -Presented with sudden onset of chest pain, right-sided, sharp and radiating to the right arm, pleuritic and worse with movement.   - CTA chest negative for PE but showed right lower lobe consolidation 3.7 x 1.9 cm, favored to represent infectious or inflammatory process, suggested follow-up imaging to ensure resolution.  Also focal area of consolidation in the lingula 2.7x 1.5 cm also infectious versus inflammatory process - Placed on IV Rocephin , doxycycline , pain control - Repeat CT scan in 4-6 weeks to ensure resolution of  pneumonia/consolidation -2D echo showed EF of 55 to 60%, normal function, no regional WMA, G1 DD   Hypertension - BP stable, continue Avapro    Hyperlipidemia - On Repatha  outpatient   GERD - Continue protonix    Anemia - H/h stable.     Prediabetes - Has not been taking home metformin , will hold.   Sjogren's syndrome Rheumatoid arthritis - On methotrexate  and Renflexis  outpatient   OSA - Continue home CPAP   Obesity class I, history of sleeve gastrectomy Estimated body mass index is 34.06 kg/m as calculated from the following:   Height as of this encounter: 5' 8 (1.727 m).   Weight as of this encounter: 101.6 kg.  Code Status: Full code DVT Prophylaxis:  enoxaparin  (LOVENOX ) injection 40 mg Start: 06/26/24 1600   Level of Care: Level of care: Telemetry Medical Family Communication: Updated patient Disposition Plan:      Remains inpatient appropriate: Hopefully home in next 24 to 48 hours if continues to improve   Procedures:  None  Consultants:   None none  Antimicrobials:   Anti-infectives (From admission, onward)    Start     Dose/Rate Route Frequency Ordered Stop   06/27/24 1000  cefTRIAXone  (ROCEPHIN ) 2 g in sodium chloride  0.9 % 100 mL IVPB        2 g 200 mL/hr over 30 Minutes Intravenous Every 24 hours 06/26/24 1510 07/02/24 0959   06/26/24 2200  doxycycline  (VIBRA -TABS) tablet 100 mg        100 mg Oral Every 12 hours 06/26/24 1510 07/01/24 2159   06/26/24 1315  cefTRIAXone  (ROCEPHIN ) 1 g in sodium chloride  0.9 % 100 mL IVPB        1 g 200 mL/hr over 30 Minutes Intravenous  Once 06/26/24 1313 06/26/24 1455   06/26/24 1100  doxycycline  (VIBRA -TABS) tablet 100 mg        100 mg Oral  Once 06/26/24 1054 06/26/24 1116   06/26/24 1100  cefTRIAXone  (ROCEPHIN ) 1 g in sodium chloride  0.9 % 100 mL IVPB        1 g 200 mL/hr over 30 Minutes Intravenous  Once 06/26/24 1054 06/26/24 1156          Medications  buPROPion   300 mg Oral Daily   doxycycline    100 mg Oral Q12H   enoxaparin  (LOVENOX ) injection  40 mg Subcutaneous Q24H   irbesartan   300 mg Oral Daily   lidocaine   1 patch Transdermal Q24H   pantoprazole   40 mg Oral Daily   sodium chloride  flush  3 mL Intravenous Q12H      Subjective:   Judy [name] was seen and examined today.  Currently no acute chest pain, shortness of breath, fever or chills nausea vomiting.  Feeling better this morning.   Objective:   Vitals:   06/27/24 1717 06/27/24 2103 06/28/24 0433 06/28/24 0844  BP: (!) 126/51 (!) 139/49 (!) 123/52 (!) 129/51  Pulse: 83 76 81 83  Resp: 19 18 18 19   Temp: 99.1 F (37.3 C) 98.3 F (36.8 C) 98.7 F (37.1 C) 98.5 F (36.9 C)  TempSrc: Oral   Oral  SpO2: 96% 97% 94% 96%  Weight:      Height:        Intake/Output Summary (Last 24 hours) at 06/28/2024 1050 Last data filed at 06/28/2024 0851 Gross per 24 hour  Intake 1103 ml  Output 0 ml  Net 1103 ml     Wt Readings from Last 3 Encounters:  06/26/24 101.6 kg  06/22/24 101.6 kg  06/07/24 103.4 kg     Exam General: Alert and oriented x 3, NAD Cardiovascular: S1 S2 auscultated,  RRR Respiratory: Clear to auscultation bilaterally, no wheezing Gastrointestinal: Soft, nontender, nondistended, + bowel sounds Ext: no pedal edema bilaterally Neuro: Strength 5/5 upper and lower extremities bilaterally Psych: Normal affect     Data Reviewed:  I have personally reviewed following labs    CBC Lab Results  Component Value Date   WBC 7.6 06/28/2024   RBC 3.78 (L) 06/28/2024   HGB 10.6 (L) 06/28/2024   HCT 32.7 (L) 06/28/2024   MCV 86.5 06/28/2024   MCH 28.0 06/28/2024   PLT 297 06/28/2024   MCHC 32.4 06/28/2024   RDW 13.9 06/28/2024   LYMPHSABS 3.5 06/07/2024   MONOABS 0.7 06/07/2024   EOSABS 0.1 06/07/2024   BASOSABS 0.1 06/07/2024     Last metabolic panel Lab Results  Component Value Date   NA 134 (L) 06/28/2024   K 3.7 06/28/2024   CL 102 06/28/2024   CO2 24 06/28/2024   BUN 14  06/28/2024   CREATININE 1.10 (H) 06/28/2024   GLUCOSE 92 06/28/2024   GFRNONAA 54 (L) 06/28/2024  GFRAA 78 01/21/2021   CALCIUM  8.5 (L) 06/28/2024   PROT 6.1 (L) 06/28/2024   ALBUMIN  2.7 (L) 06/28/2024   LABGLOB 2.8 01/13/2024   BILITOT 0.8 06/28/2024   ALKPHOS 58 06/28/2024   AST 14 (L) 06/28/2024   ALT 11 06/28/2024   ANIONGAP 8 06/28/2024    CBG (last 3)  No results for input(s): GLUCAP in the last 72 hours.    Coagulation Profile: No results for input(s): INR, PROTIME in the last 168 hours.   Radiology Studies: I have personally reviewed the imaging studies  ECHOCARDIOGRAM COMPLETE Result Date: 06/27/2024    ECHOCARDIOGRAM REPORT   Patient Name:   Judy [name] Date of Exam: 06/27/2024 Medical Rec #:  [id number]    Height:       68.0 in Accession #:    [id number]   Weight:       224.0 lb Date of Birth:  1951/02/25    BSA:          2.145 m Patient Age:    72 years     BP:           110/48 mmHg Patient Gender: F            HR:           77 bpm. Exam Location:  Inpatient Procedure: 2D Echo, Color Doppler and Cardiac Doppler (Both Spectral and Color            Flow Doppler were utilized during procedure). Indications:    R07.9* Chest pain, unspecified  History:        Patient has prior history of Echocardiogram examinations, most                 recent 10/12/2020. Risk Factors:Hypertension, Dyslipidemia and                 Sleep Apnea.  Sonographer:    Damien Senior RDCS Referring Phys: [id number] ATLEE SIRA IMPRESSIONS  1. Left ventricular ejection fraction, by estimation, is 55 to 60%. The left ventricle has normal function. The left ventricle has no regional wall motion abnormalities. Left ventricular diastolic parameters are consistent with Grade I diastolic dysfunction (impaired relaxation).  2. Right ventricular systolic function is normal. The right ventricular size is normal. There is normal pulmonary artery systolic pressure. The estimated right ventricular systolic pressure is  23.1 mmHg.  3. The mitral valve is normal in structure. Trivial mitral valve regurgitation.  4. The aortic valve has an indeterminant number of cusps. Aortic valve regurgitation is not visualized.  5. The inferior vena cava is normal in size with greater than 50% respiratory variability, suggesting right atrial pressure of 3 mmHg. Comparison(s): No significant change from prior study. Prior images reviewed side by side. FINDINGS  Left Ventricle: Left ventricular ejection fraction, by estimation, is 55 to 60%. The left ventricle has normal function. The left ventricle has no regional wall motion abnormalities. The left ventricular internal cavity size was normal in size. There is  no left ventricular hypertrophy. Left ventricular diastolic parameters are consistent with Grade I diastolic dysfunction (impaired relaxation). Normal left ventricular filling pressure. Right Ventricle: The right ventricular size is normal. No increase in right ventricular wall thickness. Right ventricular systolic function is normal. There is normal pulmonary artery systolic pressure. The tricuspid regurgitant velocity is 2.24 m/s, and  with an assumed right atrial pressure of 3 mmHg, the estimated right ventricular systolic pressure is 23.1 mmHg. Left Atrium: Left atrial size was normal  in size. Right Atrium: Right atrial size was normal in size. Pericardium: There is no evidence of pericardial effusion. Presence of epicardial fat layer. Mitral Valve: The mitral valve is normal in structure. Trivial mitral valve regurgitation. Tricuspid Valve: The tricuspid valve is normal in structure. Tricuspid valve regurgitation is trivial. Aortic Valve: The aortic valve has an indeterminant number of cusps. Aortic valve regurgitation is not visualized. Pulmonic Valve: The pulmonic valve was normal in structure. Pulmonic valve regurgitation is trivial. Aorta: The aortic root and ascending aorta are structurally normal, with no evidence of dilitation.  Venous: The inferior vena cava is normal in size with greater than 50% respiratory variability, suggesting right atrial pressure of 3 mmHg. IAS/Shunts: No atrial level shunt detected by color flow Doppler.  LEFT VENTRICLE PLAX 2D LVIDd:         5.10 cm   Diastology LVIDs:         3.70 cm   LV e' medial:    8.16 cm/s LV PW:         0.90 cm   LV E/e' medial:  8.6 LV IVS:        0.90 cm   LV e' lateral:   10.80 cm/s LVOT diam:     2.00 cm   LV E/e' lateral: 6.5 LV SV:         56 LV SV Index:   26 LVOT Area:     3.14 cm  RIGHT VENTRICLE RV S prime:     13.10 cm/s TAPSE (M-mode): 2.4 cm LEFT ATRIUM             Index        RIGHT ATRIUM           Index LA diam:        3.70 cm 1.73 cm/m   RA Area:     17.20 cm LA Vol (A2C):   34.9 ml 16.27 ml/m  RA Volume:   44.50 ml  20.75 ml/m LA Vol (A4C):   60.5 ml 28.21 ml/m LA Biplane Vol: 46.3 ml 21.59 ml/m  AORTIC VALVE LVOT Vmax:   87.80 cm/s LVOT Vmean:  69.700 cm/s LVOT VTI:    0.178 m  AORTA Ao Root diam: 3.00 cm Ao Asc diam:  3.10 cm MITRAL VALVE               TRICUSPID VALVE MV Area (PHT): 3.16 cm    TR Peak grad:   20.1 mmHg MV Decel Time: 240 msec    TR Vmax:        224.00 cm/s MV E velocity: 70.20 cm/s MV A velocity: 84.90 cm/s  SHUNTS MV E/A ratio:  0.83        Systemic VTI:  0.18 m                            Systemic Diam: 2.00 cm Jerel Croitoru MD Electronically signed by Jerel Balding MD Signature Date/Time: 06/27/2024/3:05:22 PM    Final        Nydia Distance M.D. [facility] 06/28/2024, 10:50 AM  Available via Epic secure chat 7am-7pm After 7 pm, please refer to night coverage provider listed on amion.

## 2024-06-28 NOTE — Progress Notes (Signed)
   06/28/24 2330  BiPAP/CPAP/SIPAP  Reason BIPAP/CPAP not in use Non-compliant (Pt declined Cpap for the night. Advised to have RN call RT if she changes her mind)

## 2024-06-29 DIAGNOSIS — J189 Pneumonia, unspecified organism: Secondary | ICD-10-CM | POA: Diagnosis not present

## 2024-06-29 LAB — TROPONIN I (HIGH SENSITIVITY)
Troponin I (High Sensitivity): 4 ng/L (ref <18)
Troponin I (High Sensitivity): 4 ng/L (ref <18)

## 2024-06-29 MED ORDER — METHOCARBAMOL 500 MG PO TABS: 500.0000 mg | ORAL_TABLET | Freq: Four times a day (QID) | ORAL | 0 refills | Status: DC | PRN

## 2024-06-29 MED ORDER — HYDROMORPHONE HCL 1 MG/ML IJ SOLN
0.5000 mg | Freq: Once | INTRAMUSCULAR | Status: AC
  Administered 2024-06-29: 0.5 mg via INTRAVENOUS
  Filled 2024-06-29: qty 0.5

## 2024-06-29 MED ORDER — CEFUROXIME AXETIL 500 MG PO TABS: 500.0000 mg | ORAL_TABLET | Freq: Two times a day (BID) | ORAL | 0 refills | Status: AC

## 2024-06-29 MED ORDER — OXYCODONE HCL 5 MG PO TABS: 5.0000 mg | ORAL_TABLET | Freq: Four times a day (QID) | ORAL | 0 refills | Status: DC | PRN

## 2024-06-29 MED ORDER — SOLIFENACIN SUCCINATE 10 MG PO TABS: 10.0000 mg | ORAL_TABLET | ORAL | Status: DC | PRN

## 2024-06-29 MED ORDER — FAMOTIDINE 20 MG PO TABS: 20.0000 mg | ORAL_TABLET | ORAL | Status: AC | PRN

## 2024-06-29 MED ORDER — ONDANSETRON 4 MG PO TBDP
4.0000 mg | ORAL_TABLET | Freq: Once | ORAL | Status: AC
  Administered 2024-06-29: 4 mg via ORAL
  Filled 2024-06-29: qty 1

## 2024-06-29 NOTE — Plan of Care (Addendum)
  Patient is complaining of left-sided breast that goes back around her back.  Patient has given morphine .  Patient is very concerned about this pain.  Patient is stating that she has pain before but it has been progressively getting worse.  Per chart review patient has atypical chest pain since the admission but that was on right side earlier. Obtaining troponin and checking EKG.  Update, - EKG showing normal sinus rhythm heart rate 71.  There is no ST to abnormality.  Pending troponin level.  Shakura Cowing, MD [facility] 06/29/2024, 5:22 AM

## 2024-06-29 NOTE — Progress Notes (Signed)
 Pt reports continued nausea. MD messaged for nausea medication now. She has still not taken her shower. Son has come and left. Pt wants to take nausea medication and shower and reassess before she leaves. Pt provided cold washcloth and alcohol swabs to smell for nausea relief until medication approved.

## 2024-06-29 NOTE — Discharge Summary (Signed)
 Physician Discharge Summary  [name] FMW:[id number] DOB: 01/03/1951 DOA: 06/26/2024  PCP: Dorina Loving, PA-C  Admit date: 06/26/2024 Discharge date: 06/29/2024  Admitted From: Home Disposition: Home  Recommendations for Outpatient Follow-up:  Follow up with PCP in 1 week with repeat CBC/BMP Recommend outpatient evaluation and follow-up by cardiology Follow up in ED if symptoms worsen or new appear   Home Health: No Equipment/Devices: None  Discharge Condition: Stable CODE STATUS: Full Diet recommendation: Heart healthy  Brief/Interim Summary: 73 year old female with HTN, hyperlipidemia, GERD, anemia, prediabetes status post sleeve gastrectomy, OSA, obesity, Sjogren's syndrome, RA, Mnire's disease, sickle cell trait, PE presented with chest pain.  On presentation, CTA chest was negative for PE but showed right lower lobe masslike distortion measuring 3.7 x 1.9 cm with adjacent pleural thickening favoring infectious versus inflammatory etiology along with focal consolidation in the lingula.  She was started on IV antibiotics.  During the hospitalization, her condition has improved.  She is currently on room air.  Echo showed EF of 85 to 60% with no regional wall motion abnormality.  She still is having intermittent chest pain but feels okay to go home today.  She will be discharged home on oral Ceftin  for 3 more days.  Outpatient follow-up with PCP and recommend outpatient evaluation and follow-up with cardiology.   Discharge Diagnoses:   Community-acquired pneumonia with atypical chest pain - Imaging as above.  Will need repeat CT scan in 4 to 6 weeks to ensure resolution of pneumonia/consolidation - Treated with Rocephin  and doxycycline .  Currently on room air. -Echo showed EF of 85 to 60% with no regional wall motion abnormality.  She still is having intermittent chest pain but feels okay to go home today.  She will be discharged home on oral Ceftin  for 3 more days.  Outpatient  follow-up with PCP and recommend outpatient evaluation and follow-up with cardiology. - EKG was nonischemic.  Troponins did not trend upwards  Hypertension Hyperlipidemia -Blood pressure stable.  Continue candesartan .  Continue Repatha   GERD Will continue PPI and as needed famotidine   Hyponatremia -Mild.  Outpatient follow-up  Anemia of chronic disease From chronic illnesses.  Hemoglobin stable.  Monitor intermittently  Leukocytosis Resolved  Sjogren's syndrome Rheumatoid arthritis - Continue home regimen.  Outpatient follow-up with rheumatology  OSA - Continue CPAP at home  Obesity class I Outpatient follow-up  Discharge Instructions  Discharge Instructions     Ambulatory referral to Cardiology   Complete by: As directed    Diet - low sodium heart healthy   Complete by: As directed    Increase activity slowly   Complete by: As directed       Allergies as of 06/29/2024       Reactions   Metrizamide Shortness Of Breath, Swelling   Swelling mouth   Atorvastatin Other (See Comments)   Muscle aches requiring increased use of pain medication Muscle aches requiring increased use of pain medication        Medication List     STOP taking these medications    naproxen  sodium 220 MG tablet Commonly known as: ALEVE    Semaglutide -Weight Management 0.25 MG/0.5ML Soaj       TAKE these medications    bismuth subsalicylate 262 MG/15ML suspension Commonly known as: PEPTO BISMOL Take 30 mLs by mouth every 6 (six) hours as needed for indigestion or diarrhea or loose stools.   buPROPion  300 MG 24 hr tablet Commonly known as: WELLBUTRIN  XL TAKE 1 TABLET BY MOUTH EVERY DAY  candesartan  32 MG tablet Commonly known as: ATACAND  Take 1 tablet (32 mg total) by mouth daily.   cefUROXime  500 MG tablet Commonly known as: CEFTIN  Take 1 tablet (500 mg total) by mouth 2 (two) times daily for 3 days.   famotidine  20 MG tablet Commonly known as: PEPCID  Take 1  tablet (20 mg total) by mouth as needed for heartburn or indigestion (if protonix  doesn't work).   Medrol  4 MG tablet Generic drug: methylPREDNISolone  Take 4 mg by mouth daily.   metFORMIN  500 MG 24 hr tablet Commonly known as: GLUCOPHAGE -XR Take 1 tablet (500 mg total) by mouth daily after breakfast.   methocarbamol  500 MG tablet Commonly known as: ROBAXIN  Take 1 tablet (500 mg total) by mouth every 6 (six) hours as needed for muscle spasms.   methotrexate  50 MG/2ML injection Inject 25 mg into the skin once a week. On Saturdays   ondansetron  4 MG disintegrating tablet Commonly known as: ZOFRAN -ODT Take 4 mg by mouth every 8 (eight) hours as needed for vomiting or nausea.   oxyCODONE  5 MG immediate release tablet Commonly known as: Roxicodone  Take 1 tablet (5 mg total) by mouth every 6 (six) hours as needed for moderate pain (pain score 4-6) or severe pain (pain score 7-10).   pantoprazole  40 MG tablet Commonly known as: PROTONIX  TAKE 1 TABLET BY MOUTH EVERY DAY   Renflexis  100 MG Solr Generic drug: inFLIXimab -abda Inject 100 mg into the vein every 8 (eight) weeks. Remicaid infusion every 8 weeks for RA   Repatha  SureClick 140 MG/ML Soaj Generic drug: Evolocumab  INJECT 140 MG INTO THE SKIN EVERY 14 (FOURTEEN) DAYS.   solifenacin  10 MG tablet Commonly known as: VESICARE  Take 1 tablet (10 mg total) by mouth as needed.   Vitamin D  1000 units capsule Take 1,000 Units by mouth daily.        Follow-up Information     Saguier, Edward, PA-C. Schedule an appointment as soon as possible for a visit in 1 week(s).   Specialties: Internal Medicine, Family Medicine Contact information: [address] [place] [address] [phone]                Allergies  Allergen Reactions   Metrizamide Shortness Of Breath and Swelling    Swelling mouth   Atorvastatin Other (See Comments)    Muscle aches requiring increased use of pain medication Muscle  aches requiring increased use of pain medication    Consultations: None   Procedures/Studies: ECHOCARDIOGRAM COMPLETE Result Date: 06/27/2024    ECHOCARDIOGRAM REPORT   Patient Name:   Judy [name] Date of Exam: 06/27/2024 Medical Rec #:  [id number]    Height:       68.0 in Accession #:    [id number]   Weight:       224.0 lb Date of Birth:  February 11, 1951    BSA:          2.145 m Patient Age:    72 years     BP:           110/48 mmHg Patient Gender: F            HR:           77 bpm. Exam Location:  Inpatient Procedure: 2D Echo, Color Doppler and Cardiac Doppler (Both Spectral and Color            Flow Doppler were utilized during procedure). Indications:    R07.9* Chest pain, unspecified  History:  Patient has prior history of Echocardiogram examinations, most                 recent 10/12/2020. Risk Factors:Hypertension, Dyslipidemia and                 Sleep Apnea.  Sonographer:    Damien Senior RDCS Referring Phys: [id number] ATLEE SIRA IMPRESSIONS  1. Left ventricular ejection fraction, by estimation, is 55 to 60%. The left ventricle has normal function. The left ventricle has no regional wall motion abnormalities. Left ventricular diastolic parameters are consistent with Grade I diastolic dysfunction (impaired relaxation).  2. Right ventricular systolic function is normal. The right ventricular size is normal. There is normal pulmonary artery systolic pressure. The estimated right ventricular systolic pressure is 23.1 mmHg.  3. The mitral valve is normal in structure. Trivial mitral valve regurgitation.  4. The aortic valve has an indeterminant number of cusps. Aortic valve regurgitation is not visualized.  5. The inferior vena cava is normal in size with greater than 50% respiratory variability, suggesting right atrial pressure of 3 mmHg. Comparison(s): No significant change from prior study. Prior images reviewed side by side. FINDINGS  Left Ventricle: Left ventricular ejection fraction, by estimation,  is 55 to 60%. The left ventricle has normal function. The left ventricle has no regional wall motion abnormalities. The left ventricular internal cavity size was normal in size. There is  no left ventricular hypertrophy. Left ventricular diastolic parameters are consistent with Grade I diastolic dysfunction (impaired relaxation). Normal left ventricular filling pressure. Right Ventricle: The right ventricular size is normal. No increase in right ventricular wall thickness. Right ventricular systolic function is normal. There is normal pulmonary artery systolic pressure. The tricuspid regurgitant velocity is 2.24 m/s, and  with an assumed right atrial pressure of 3 mmHg, the estimated right ventricular systolic pressure is 23.1 mmHg. Left Atrium: Left atrial size was normal in size. Right Atrium: Right atrial size was normal in size. Pericardium: There is no evidence of pericardial effusion. Presence of epicardial fat layer. Mitral Valve: The mitral valve is normal in structure. Trivial mitral valve regurgitation. Tricuspid Valve: The tricuspid valve is normal in structure. Tricuspid valve regurgitation is trivial. Aortic Valve: The aortic valve has an indeterminant number of cusps. Aortic valve regurgitation is not visualized. Pulmonic Valve: The pulmonic valve was normal in structure. Pulmonic valve regurgitation is trivial. Aorta: The aortic root and ascending aorta are structurally normal, with no evidence of dilitation. Venous: The inferior vena cava is normal in size with greater than 50% respiratory variability, suggesting right atrial pressure of 3 mmHg. IAS/Shunts: No atrial level shunt detected by color flow Doppler.  LEFT VENTRICLE PLAX 2D LVIDd:         5.10 cm   Diastology LVIDs:         3.70 cm   LV e' medial:    8.16 cm/s LV PW:         0.90 cm   LV E/e' medial:  8.6 LV IVS:        0.90 cm   LV e' lateral:   10.80 cm/s LVOT diam:     2.00 cm   LV E/e' lateral: 6.5 LV SV:         56 LV SV Index:   26  LVOT Area:     3.14 cm  RIGHT VENTRICLE RV S prime:     13.10 cm/s TAPSE (M-mode): 2.4 cm LEFT ATRIUM  Index        RIGHT ATRIUM           Index LA diam:        3.70 cm 1.73 cm/m   RA Area:     17.20 cm LA Vol (A2C):   34.9 ml 16.27 ml/m  RA Volume:   44.50 ml  20.75 ml/m LA Vol (A4C):   60.5 ml 28.21 ml/m LA Biplane Vol: 46.3 ml 21.59 ml/m  AORTIC VALVE LVOT Vmax:   87.80 cm/s LVOT Vmean:  69.700 cm/s LVOT VTI:    0.178 m  AORTA Ao Root diam: 3.00 cm Ao Asc diam:  3.10 cm MITRAL VALVE               TRICUSPID VALVE MV Area (PHT): 3.16 cm    TR Peak grad:   20.1 mmHg MV Decel Time: 240 msec    TR Vmax:        224.00 cm/s MV E velocity: 70.20 cm/s MV A velocity: 84.90 cm/s  SHUNTS MV E/A ratio:  0.83        Systemic VTI:  0.18 m                            Systemic Diam: 2.00 cm Jerel Croitoru MD Electronically signed by Jerel Balding MD Signature Date/Time: 06/27/2024/3:05:22 PM    Final    CT Angio Chest PE W and/or Wo Contrast Result Date: 06/26/2024 CLINICAL DATA:  Evaluate for pulmonary embolism. Low to intermediate probability. Positive D-dimer. EXAM: CT ANGIOGRAPHY CHEST WITH CONTRAST TECHNIQUE: Multidetector CT imaging of the chest was performed using the standard protocol during bolus administration of intravenous contrast. Multiplanar CT image reconstructions and MIPs were obtained to evaluate the vascular anatomy. RADIATION DOSE REDUCTION: This exam was performed according to the departmental dose-optimization program which includes automated exposure control, adjustment of the mA and/or kV according to patient size and/or use of iterative reconstruction technique. CONTRAST:  75mL OMNIPAQUE  IOHEXOL  350 MG/ML SOLN COMPARISON:  04/18/2024 FINDINGS: Cardiovascular: Satisfactory opacification of the pulmonary arteries to the segmental level. No evidence of pulmonary embolism. Normal heart size. No pericardial effusion. Aortic atherosclerotic calcifications. Mediastinum/Nodes: No significant  mediastinal or hilar adenopathy. The thyroid  gland, trachea and esophagus are unremarkable. Lungs/Pleura: There is no pleural effusion or pneumothorax. New subpleural consolidative change is identified within the paravertebral right lower lobe with adjacent pleural thickening. Right lower lobe, paravertebral masslike architectural distortion measuring 3.7 by 1.9 cm axial image 86 of the lung windows. Adjacent pleural thickening is noted along the posterior right lower lobe. These changes are new from 04/18/2024. Focal area of consolidative change within the lingula is also new from previous exam measuring 2.7 by 1.5 cm. Central airways are patent. Upper Abdomen: No acute abnormality. Postsurgical changes involving the stomach. Musculoskeletal: No chest wall abnormality. No acute or significant osseous findings. Review of the MIP images confirms the above findings. IMPRESSION: 1. No evidence for acute pulmonary embolus. 2. Right lower lobe, paravertebral masslike architectural distortion measuring 3.7 by 1.9 cm of the lung windows. Adjacent pleural thickening is noted along the posterior right lower lobe. These changes are new from 04/18/2024. Findings are favored to represent sequelae of infectious or inflammatory process. Suggest follow-up imaging to ensure resolution. 3. Focal area of consolidative change within the lingula is also new from previous exam measuring 2.7 by 1.5 cm. Also likely sequelae of inflammatory or infectious process. 4.  Aortic Atherosclerosis (ICD10-I70.0). Electronically  Signed   By: Waddell Calk M.D.   On: 06/26/2024 09:43   DG Chest 2 View Result Date: 06/26/2024 CLINICAL DATA:  73 year old female with chest pain. EXAM: CHEST - 2 VIEW COMPARISON:  Chest CT without contrast 04/18/2024 and earlier. FINDINGS: PA and lateral views 0831 hours. Pectus excavatum. Stable borderline to mild cardiomegaly. Other mediastinal contours are within normal limits. Lower lung volumes compared to 2022  on the lateral. Visualized tracheal air column is within normal limits. No pneumothorax, pulmonary edema, pleural effusion or confluent lung opacity. No acute osseous abnormality identified. Negative visible bowel gas. IMPRESSION: 1. No acute cardiopulmonary abnormality. 2. Pectus excavatum. Stable borderline to mild cardiomegaly. Electronically Signed   By: VEAR Hurst M.D.   On: 06/26/2024 08:52      Subjective: Patient seen and examined at bedside.  Feels better and feels okay to go home today.  Continues to have intermittent chest and back pain.  No fever, vomiting, abdominal pain reported.  Discharge Exam: Vitals:   06/29/24 0642 06/29/24 0828  BP: 112/79 (!) 128/56  Pulse: 76 72  Resp: 18 18  Temp: 98.2 F (36.8 C) 98 F (36.7 C)  SpO2: 96% 96%    General: Pt is alert, awake, not in acute distress.  On room air. Cardiovascular: rate controlled, S1/S2 + Respiratory: bilateral decreased breath sounds at bases with some scattered crackles Abdominal: Soft, obese, NT, ND, bowel sounds + Extremities: no edema, no cyanosis    The results of significant diagnostics from this hospitalization (including imaging, microbiology, ancillary and laboratory) are listed below for reference.     Microbiology: Recent Results (from the past 240 hours)  Microscopic Examination     Status: None   Collection Time: 06/22/24 12:00 AM   Urine  Result Value Ref Range Status   WBC, UA 0-5 0 - 5 /hpf Final   RBC, Urine 0-2 0 - 2 /hpf Final   Epithelial Cells (non renal) 0-10 0 - 10 /hpf Final   Bacteria, UA Few None seen/Few Final  Blood culture (routine x 2)     Status: None (Preliminary result)   Collection Time: 06/26/24  1:32 PM   Specimen: BLOOD  Result Value Ref Range Status   Specimen Description   Final    BLOOD LEFT ANTECUBITAL Performed at [facility], [address] [place], [place] [address]    Special Requests   Final    BOTTLES DRAWN AEROBIC AND ANAEROBIC Blood  Culture adequate volume Performed at [facility], [address] [place], [place] [address]    Culture   Final    NO GROWTH 3 DAYS Performed at [facility], [address], [place], [place] [address]    Report Status PENDING  Incomplete  Blood culture (routine x 2)     Status: None (Preliminary result)   Collection Time: 06/26/24  1:38 PM   Specimen: BLOOD LEFT FOREARM  Result Value Ref Range Status   Specimen Description   Final    BLOOD LEFT FOREARM Performed at [facility], [address] [place], [place] [address]    Special Requests   Final    BOTTLES DRAWN AEROBIC AND ANAEROBIC Blood Culture adequate volume Performed at [facility], [address] [place], [place] [address]    Culture   Final    NO GROWTH 3 DAYS Performed at [facility], [address], [place], [place] [address]  Report Status PENDING  Incomplete     Labs: BNP (last 3 results) No results for input(s): BNP in the last 8760 hours. Basic Metabolic Panel: Recent Labs  Lab 06/26/24 0811 06/27/24 0544 06/28/24 0505  NA 140 136 134*  K 3.7 3.9 3.7  CL 103 101 102  CO2 26 24 24   GLUCOSE 98 92 92  BUN 18 14 14   CREATININE 0.95 1.17* 1.10*  CALCIUM  9.3 8.5* 8.5*  MG  --   --  1.9   Liver Function Tests: Recent Labs  Lab 06/27/24 0544 06/28/24 0505  AST 15 14*  ALT 11 11  ALKPHOS 65 58  BILITOT 1.0 0.8  PROT 6.5 6.1*  ALBUMIN  2.9* 2.7*   No results for input(s): LIPASE, AMYLASE in the last 168 hours. No results for input(s): AMMONIA in the last 168 hours. CBC: Recent Labs  Lab 06/26/24 0811 06/27/24 0544 06/28/24 0505  WBC 11.1* 9.8 7.6  HGB 11.8* 11.0* 10.6*  HCT 36.2 34.1* 32.7*  MCV 85.8 87.0 86.5  PLT 358 296 297   Cardiac Enzymes: No results for input(s): CKTOTAL, CKMB, CKMBINDEX, TROPONINI in the last 168 hours. BNP: Invalid input(s): POCBNP CBG: No results for input(s): GLUCAP in the last 168  hours. D-Dimer No results for input(s): DDIMER in the last 72 hours. Hgb A1c No results for input(s): HGBA1C in the last 72 hours. Lipid Profile No results for input(s): CHOL, HDL, LDLCALC, TRIG, CHOLHDL, LDLDIRECT in the last 72 hours. Thyroid  function studies No results for input(s): TSH, T4TOTAL, T3FREE, THYROIDAB in the last 72 hours.  Invalid input(s): FREET3 Anemia work up No results for input(s): VITAMINB12, FOLATE, FERRITIN, TIBC, IRON, RETICCTPCT in the last 72 hours. Urinalysis    Component Value Date/Time   APPEARANCEUR Clear 06/22/2024 0000   GLUCOSEU Negative 06/22/2024 0000   BILIRUBINUR Negative 06/22/2024 0000   KETONESUR negative 01/22/2020 1539   PROTEINUR Negative 06/22/2024 0000   UROBILINOGEN 0.2 10/27/2023 1055   NITRITE Negative 06/22/2024 0000   LEUKOCYTESUR Negative 06/22/2024 0000   Sepsis Labs Recent Labs  Lab 06/26/24 0811 06/27/24 0544 06/28/24 0505  WBC 11.1* 9.8 7.6   Microbiology Recent Results (from the past 240 hours)  Microscopic Examination     Status: None   Collection Time: 06/22/24 12:00 AM   Urine  Result Value Ref Range Status   WBC, UA 0-5 0 - 5 /hpf Final   RBC, Urine 0-2 0 - 2 /hpf Final   Epithelial Cells (non renal) 0-10 0 - 10 /hpf Final   Bacteria, UA Few None seen/Few Final  Blood culture (routine x 2)     Status: None (Preliminary result)   Collection Time: 06/26/24  1:32 PM   Specimen: BLOOD  Result Value Ref Range Status   Specimen Description   Final    BLOOD LEFT ANTECUBITAL Performed at [facility], [address] [place], [place] [address]    Special Requests   Final    BOTTLES DRAWN AEROBIC AND ANAEROBIC Blood Culture adequate volume Performed at [facility], [address] [place], [place] [address]    Culture   Final    NO GROWTH 3 DAYS Performed at [facility], [address], [place], [place] [address]    Report Status PENDING   Incomplete  Blood culture (routine x 2)     Status: None (Preliminary result)   Collection Time: 06/26/24  1:38 PM   Specimen: BLOOD LEFT FOREARM  Result Value Ref Range Status   Specimen Description   Final    BLOOD LEFT FOREARM Performed at [facility], [address] [place], [place] [address]    Special Requests   Final    BOTTLES DRAWN AEROBIC AND ANAEROBIC Blood Culture adequate volume Performed at [facility], [address] [place], [place] [address]    Culture   Final    NO GROWTH 3 DAYS Performed at [facility], [address], [place], [place] [address]    Report Status PENDING  Incomplete     Time coordinating discharge: 35 minutes  SIGNED:   Sophie Mao, MD  [facility] 06/29/2024, 10:41 AM

## 2024-06-29 NOTE — Progress Notes (Signed)
 DISCHARGE NOTE HOME Judy [name] to be discharged Home per MD order. Discussed prescriptions and follow up appointments with the patient. Prescriptions given to patient; medication list explained in detail. Patient verbalized understanding.  Skin clean, dry and intact without evidence of skin break down, no evidence of skin tears noted. IV catheter discontinued intact. Site without signs and symptoms of complications. Dressing and pressure applied. Pt denies pain at the site currently. No complaints noted.  Patient free of lines, drains, and wounds.   An After Visit Summary (AVS) was printed and given to the patient. Patient escorted via wheelchair to main entrance.   Jhoselyn Ruffini A Proctor-Gann, RN

## 2024-06-29 NOTE — Progress Notes (Signed)
 Patient c/o nausea.  Primary RN notified and currently at bedside.   AVS completed by Primary RN.  Patient not eligible for Discharge lounge at this time.

## 2024-06-29 NOTE — Progress Notes (Signed)
 Pt c/o pain at 7/10 and it is too soon for Toradol  and Morphine . RN messaged MD on call to make aware and to see if we can get something else for pain. New orders placed.  [name]

## 2024-06-29 NOTE — TOC CM/SW Note (Signed)
 Transition of Care [facility]) - Inpatient Brief Assessment   Patient Details  Name: Judy [name] MRN: [id number] Date of Birth: Jul 28, 1951  Transition of [facility]) CM/SW Contact:    Waddell Barnie Rama, RN Phone Number: 06/29/2024, 10:41 AM   Clinical Narrative: From home alone, has PCP and insurance on file, states has no HH services in place at this time or DME at home.  States family member will transport them home at [employer] and family is support system, states gets medications from [facility] on [address].  Pta self ambulatory.   There are no IP CM  needs identified  at this time.  Please place consult for IP CM  needs.     Transition of Care Asessment: Insurance and Status: Insurance coverage has been reviewed Patient has primary care physician: Yes Home environment has been reviewed: home alone Prior level of function:: indep Prior/Current Home Services: No current home services Social Drivers of Health Review: SDOH reviewed no interventions necessary Readmission risk has been reviewed: Yes Transition of care needs: no transition of care needs at this time

## 2024-06-29 NOTE — Plan of Care (Signed)
  Problem: Activity: Goal: Ability to tolerate increased activity will improve Outcome: Adequate for Discharge   Problem: Clinical Measurements: Goal: Ability to maintain a body temperature in the normal range will improve Outcome: Adequate for Discharge   Problem: Respiratory: Goal: Ability to maintain adequate ventilation will improve Outcome: Adequate for Discharge Goal: Ability to maintain a clear airway will improve Outcome: Adequate for Discharge   Problem: Education: Goal: Knowledge of General Education information will improve Description: Including pain rating scale, medication(s)/side effects and non-pharmacologic comfort measures Outcome: Adequate for Discharge

## 2024-06-29 NOTE — TOC Transition Note (Signed)
 Transition of Care [facility]) - Discharge Note   Patient Details  Name: Judy [name] MRN: [id number] Date of Birth: 1951-07-08  [facility]) CM/SW Contact:  Waddell Barnie Rama, RN Phone Number: 06/29/2024, 10:46 AM   Clinical Narrative:     For dc today, has no needs.        Patient Goals and CMS Choice            Discharge Placement                       Discharge Plan and Services Additional resources added to the After Visit Summary for                                       Social Drivers of Health (SDOH) Interventions SDOH Screenings   Food Insecurity: No Food Insecurity (06/26/2024)  Housing: Low Risk  (06/26/2024)  Transportation Needs: No Transportation Needs (06/26/2024)  Utilities: Not At Risk (06/26/2024)  Alcohol Screen: Low Risk  (03/30/2024)  Depression (PHQ2-9): Low Risk  (04/02/2023)  Financial Resource Strain: Low Risk  (03/30/2024)  Physical Activity: Insufficiently Active (03/30/2024)  Social Connections: Socially Integrated (06/26/2024)  Stress: Stress Concern Present (03/30/2024)  Tobacco Use: Low Risk  (06/26/2024)     Readmission Risk Interventions    06/29/2024   10:36 AM  Readmission Risk Prevention Plan  Post Dischage Appt Complete  Medication Screening Complete  Transportation Screening Complete

## 2024-06-29 NOTE — Progress Notes (Signed)
 Pt is reporting mild nausea. No prn med ordered. She declines need at present and wants to give it some time. Will prepare d/c medication paperwork. She wanted to ambulate in the hallway. She wants to take a shower before she is discharged. Her family will take her home. D/c lounge discussed and is plan p shower providing nausea relieved.

## 2024-06-30 ENCOUNTER — Other Ambulatory Visit: Payer: Self-pay | Admitting: Surgery

## 2024-06-30 ENCOUNTER — Telehealth: Payer: Self-pay

## 2024-06-30 DIAGNOSIS — K432 Incisional hernia without obstruction or gangrene: Secondary | ICD-10-CM

## 2024-06-30 NOTE — Transitions of Care (Post Inpatient/ED Visit) (Signed)
 06/30/2024  Name: Judy [name] MRN: [id number] DOB: 05/22/1951  Today's TOC FU Call Status: Today's TOC FU Call Status:: Successful TOC FU Call Completed TOC FU Call Complete Date: 06/30/24 Patient's Name and Date of Birth confirmed.  Transition Care Management Follow-up Telephone Call Date of Discharge: 06/29/24 Discharge Facility: [facility]) Type of Discharge: Inpatient Admission Primary Inpatient Discharge Diagnosis:: pneumonia How have you been since you were released from the hospital?: Better Any questions or concerns?: No  Items Reviewed: Did you receive and understand the discharge instructions provided?: Yes Medications obtained,verified, and reconciled?: Yes (Medications Reviewed) Any new allergies since your discharge?: No Dietary orders reviewed?: Yes Do you have support at home?: Yes People in Home [RPT]: spouse  Medications Reviewed Today: Medications Reviewed Today     Reviewed by Emmitt Pan, LPN (Licensed Practical Nurse) on 06/30/24 at 1250  Med List Status: <None>   Medication Order Taking? Sig Documenting Provider Last Dose Status Informant  bismuth subsalicylate (PEPTO BISMOL) 262 MG/15ML suspension [id number] Yes Take 30 mLs by mouth every 6 (six) hours as needed for indigestion or diarrhea or loose stools. [provider]  Active Self, Pharmacy Records  buPROPion  (WELLBUTRIN  XL) 300 MG 24 hr tablet [id number] Yes TAKE 1 TABLET BY MOUTH EVERY DAY Saguier, [place], PA-C  Active Self, Pharmacy Records  candesartan  (ATACAND ) 32 MG tablet [id number] Yes Take 1 tablet (32 mg total) by mouth daily. Saguier, [place], PA-C  Active Self, Pharmacy Records  cefUROXime  (CEFTIN ) 500 MG tablet [id number] Yes Take 1 tablet (500 mg total) by mouth 2 (two) times daily for 3 days. Cheryle Page, MD  Active   Cholecalciferol (VITAMIN D ) 1000 UNITS capsule [id number] Yes Take 1,000 Units by mouth daily. [provider]  Active Self, Pharmacy Records   Evolocumab  (REPATHA  SURECLICK) 140 MG/ML SOAJ [id number] Yes INJECT 140 MG INTO THE SKIN EVERY 14 (FOURTEEN) DAYS.  Patient taking differently: Inject 140 mg into the skin every 14 (fourteen) days.   Nida, Gebreselassie W, MD  Active Self, Pharmacy Records  famotidine  (PEPCID ) 20 MG tablet [id number] Yes Take 1 tablet (20 mg total) by mouth as needed for heartburn or indigestion (if protonix  doesn't work). Cheryle Page, MD  Active   inFLIXimab -abda (RENFLEXIS ) 100 MG SOLR [id number] Yes Inject 100 mg into the vein every 8 (eight) weeks. Remicaid infusion every 8 weeks for RA [provider]  Active Self, Pharmacy Records  MEDROL  4 MG tablet [id number] Yes Take 4 mg by mouth daily. [provider]  Active Self, Pharmacy Records  metFORMIN  (GLUCOPHAGE -XR) 500 MG 24 hr tablet [id number]  Take 1 tablet (500 mg total) by mouth daily after breakfast.  Patient not taking: Reported on 06/30/2024   Nida, Gebreselassie W, MD  Active Self, Pharmacy Records  methocarbamol  (ROBAXIN ) 500 MG tablet [id number] Yes Take 1 tablet (500 mg total) by mouth every 6 (six) hours as needed for muscle spasms. Cheryle Page, MD  Active   methotrexate  50 MG/2ML injection [id number] Yes Inject 25 mg into the skin once a week. On Saturdays [provider]  Active Self, Pharmacy Records  ondansetron  (ZOFRAN -ODT) 4 MG disintegrating tablet [id number] Yes Take 4 mg by mouth every 8 (eight) hours as needed for vomiting or nausea. [provider]  Active Self, Pharmacy Records  oxyCODONE  (ROXICODONE ) 5 MG immediate release tablet [id number] Yes Take 1 tablet (5 mg total) by mouth every 6 (six) hours as needed for moderate pain (pain score 4-6) or severe pain (pain score 7-10).  Cheryle Page, MD  Active   pantoprazole  (PROTONIX ) 40 MG tablet [id number] Yes TAKE 1 TABLET BY MOUTH EVERY DAY Saguier, [place], PA-C  Active Self, Pharmacy Records  solifenacin  (VESICARE ) 10 MG tablet [id number] Yes Take 1 tablet  (10 mg total) by mouth as needed. Cheryle Page, MD  Active             Home Care and Equipment/Supplies: Were Home Health Services Ordered?: NA Any new equipment or medical supplies ordered?: NA  Functional Questionnaire: Do you need assistance with bathing/showering or dressing?: No Do you need assistance with meal preparation?: No Do you need assistance with eating?: No Do you have difficulty maintaining continence: No Do you need assistance with getting out of bed/getting out of a chair/moving?: No Do you have difficulty managing or taking your medications?: No  Follow up appointments reviewed: PCP Follow-up appointment confirmed?: Yes Date of PCP follow-up appointment?: 07/07/24 Follow-up Provider: [facility] Follow-up appointment confirmed?: Yes Date of Specialist follow-up appointment?: 08/30/24 Follow-Up Specialty Provider:: cardio Do you need transportation to your follow-up appointment?: No Do you understand care options if your condition(s) worsen?: Yes-patient verbalized understanding    SIGNATURE Julian Lemmings, LPN [facility] Nurse Health Advisor Direct Dial [phone]

## 2024-07-01 LAB — CULTURE, BLOOD (ROUTINE X 2)
Culture: NO GROWTH
Culture: NO GROWTH
Special Requests: ADEQUATE
Special Requests: ADEQUATE

## 2024-07-05 ENCOUNTER — Ambulatory Visit

## 2024-07-05 ENCOUNTER — Ambulatory Visit
Admission: RE | Admit: 2024-07-05 | Discharge: 2024-07-05 | Disposition: A | Discharge status: Home / Self Care | Source: Ambulatory Visit | Attending: Surgery | Admitting: Surgery

## 2024-07-05 DIAGNOSIS — K432 Incisional hernia without obstruction or gangrene: Secondary | ICD-10-CM

## 2024-07-05 DIAGNOSIS — K429 Umbilical hernia without obstruction or gangrene: Secondary | ICD-10-CM | POA: Diagnosis not present

## 2024-07-05 NOTE — Telephone Encounter (Signed)
Do you have any recommendations?

## 2024-07-06 ENCOUNTER — Ambulatory Visit (HOSPITAL_BASED_OUTPATIENT_CLINIC_OR_DEPARTMENT_OTHER)
Admission: RE | Admit: 2024-07-06 | Discharge: 2024-07-06 | Disposition: A | Discharge status: Home / Self Care | Source: Ambulatory Visit | Attending: Medical | Admitting: Medical

## 2024-07-06 ENCOUNTER — Ambulatory Visit: Payer: Self-pay | Admitting: Medical

## 2024-07-06 ENCOUNTER — Ambulatory Visit (INDEPENDENT_AMBULATORY_CARE_PROVIDER_SITE_OTHER): Admitting: Medical

## 2024-07-06 ENCOUNTER — Encounter: Payer: Self-pay | Admitting: Medical

## 2024-07-06 VITALS — BP 120/80 | HR 87 | Temp 98.2°F | Ht 68.0 in | Wt 223.8 lb

## 2024-07-06 DIAGNOSIS — R6883 Chills (without fever): Secondary | ICD-10-CM | POA: Insufficient documentation

## 2024-07-06 DIAGNOSIS — Z8701 Personal history of pneumonia (recurrent): Secondary | ICD-10-CM

## 2024-07-06 DIAGNOSIS — R5383 Other fatigue: Secondary | ICD-10-CM | POA: Insufficient documentation

## 2024-07-06 DIAGNOSIS — D649 Anemia, unspecified: Secondary | ICD-10-CM | POA: Diagnosis not present

## 2024-07-06 DIAGNOSIS — R944 Abnormal results of kidney function studies: Secondary | ICD-10-CM | POA: Diagnosis not present

## 2024-07-06 NOTE — Progress Notes (Signed)
 Subjective:    Patient ID: Judy [name], female    DOB: 03-29-1951, 73 y.o.   MRN: [id number]  HPI  Admit date: 06/26/2024 Discharge date: 06/29/2024   Admitted From: Home Disposition: Home   Recommendations for Outpatient Follow-up:  Follow up with PCP in 1 week with repeat CBC/BMP Recommend outpatient evaluation and follow-up by cardiology Follow up in ED if symptoms worsen or new appear  Brief/Interim Summary: 73 year old female with HTN, hyperlipidemia, GERD, anemia, prediabetes status post sleeve gastrectomy, OSA, obesity, Sjogren's syndrome, RA, Mnire's disease, sickle cell trait, PE presented with chest pain.  On presentation, CTA chest was negative for PE but showed right lower lobe masslike distortion measuring 3.7 x 1.9 cm with adjacent pleural thickening favoring infectious versus inflammatory etiology along with focal consolidation in the lingula.  She was started on IV antibiotics.  During the hospitalization, her condition has improved.  She is currently on room air.  Echo showed EF of 85 to 60% with no regional wall motion abnormality.  She still is having intermittent chest pain but feels okay to go home today.  She will be discharged home on oral Ceftin  for 3 more days.  Outpatient follow-up with PCP and recommend outpatient evaluation and follow-up with cardiology.     Discharge Diagnoses:    Community-acquired pneumonia with atypical chest pain - Imaging as above.  Will need repeat CT scan in 4 to 6 weeks to ensure resolution of pneumonia/consolidation - Treated with Rocephin  and doxycycline .  Currently on room air. -Echo showed EF of 85 to 60% with no regional wall motion abnormality.  She still is having intermittent chest pain but feels okay to go home today.  She will be discharged home on oral Ceftin  for 3 more days.  Outpatient follow-up with PCP and recommend outpatient evaluation and follow-up with cardiology. - EKG was nonischemic.  Troponins did not trend  upwards   Hypertension Hyperlipidemia -Blood pressure stable.  Continue candesartan .  Continue Repatha    GERD Will continue PPI and as needed famotidine    Hyponatremia -Mild.  Outpatient follow-up   Anemia of chronic disease From chronic illnesses.  Hemoglobin stable.  Monitor intermittently   Leukocytosis Resolved   Sjogren's syndrome Rheumatoid arthritis - Continue home regimen.  Outpatient follow-up with rheumatology   OSA - Continue CPAP at home   Obesity class I Outpatient follow-up   Discharge Instructions     Follow-up Information       Christion Leonhard, PA-C. Schedule an appointment as soon as possible for a visit in 1 week(s).   Specialties: Internal Medicine, Family Medicine Contact information: [address] [place] [address] [phone]        BRIDGITTE [name] is a 73 year old female who presents with fatigue following a recent hospitalization for pneumonia.  She was recently hospitalized for pneumonia and is now experiencing significant fatigue. Prior to hospitalization, she felt extremely unwell, attributing some symptoms to working outside in the heat and a tooth infection a month prior. She was discharged with a three-day course of cefdinir after receiving ceftriaxone , Rosadan, and doxycycline  in the hospital.  During her hospital stay, a CT scan with contrast was performed due to inconclusive chest x-ray results, revealing a right lower lobe paravertebral mass measuring 3.7 by 1.9 cm with adjacent pleural thickening, and a focal area of consolidation in the lingula measuring 2.7 by 1.5 cm. These findings were new compared to a previous exam from May 12.  Since  discharge, she has experienced night sweats, waking up wet but not feeling hot, and occasional chills. No current cough or chest pain, but she describes previous sharp pain on the right side, occasionally radiating to the left side as 'band-like'. No shortness of breath since  discharge. No recurrent chest pain on dc. Has cardiologist appt next wed.  She underwent two sets of troponin tests and an EKG during her hospital stay. She is  also scheduled to see a pulmonologist on August 6 for follow-up on the CT findings and has an outpatient cardiology appointment scheduled for October. Her fatigue persists, although she feels progressively better compared to her hospital admission.   Review of Systems  Constitutional:  Positive for fatigue. Negative for chills and fever.  HENT:  Negative for congestion.   Respiratory:  Negative for cough, chest tightness and wheezing.   Cardiovascular:  Negative for chest pain and palpitations.  Gastrointestinal:  Negative for abdominal pain, nausea and vomiting.  Genitourinary:  Negative for dysuria, flank pain and frequency.  Musculoskeletal:  Negative for back pain.  Neurological:  Negative for dizziness and headaches.  Hematological:  Negative for adenopathy.  Psychiatric/Behavioral:  Negative for behavioral problems, decreased concentration and dysphoric mood.        Objective:   Physical Exam  General Mental Status- Alert. General Appearance- Not in acute distress.   Skin General: Color- Normal Color. Moisture- Normal Moisture.  Neck Carotid Arteries- Normal color. Moisture- Normal Moisture. No carotid bruits. No JVD.  Chest and Lung Exam Auscultation: Breath Sounds:-Normal.  Cardiovascular Auscultation:Rythm- Regular. Murmurs & Other Heart Sounds:Auscultation of the heart reveals- No Murmurs.  Abdomen Inspection:-Inspeection Normal. Palpation/Percussion:Note:No mass. Palpation and Percussion of the abdomen reveal- Non Tender, Non Distended + BS, no rebound or guarding.    Neurologic Cranial Nerve exam:- CN III-XII intact(No nystagmus), symmetric smile. Drift Test:- No drift. Romberg Exam:- Negative.  Heal to Toe Gait exam:-Normal. Finger to Nose:- Normal/Intact Strength:- 5/5 equal and symmetric  strength both upper and lower extremities.    Lower ext- calfs symmetric, negative homans signs. No pedal edema. No calf tenderness to palpation.     Assessment & Plan:   Patient Instructions  Pneumonia, right lower lobe and lingula, resolving Pneumonia resolving with residual night sweats and fatigue. No cough or fever. Discharged on ceftin  after hospital treatment with ceftriaxone , Rocephin  and doxycycline . - Order chest x-ray today to assess current status. - Repeat CT scan in 4-6 weeks to ensure resolution. - Advise emergency department visit if significant chest pain occurs. - Consider another round of antibiotics if symptoms of chest congestion and cough with mucus develop. - Follow up with pulmonologist on August 6th.  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 New finding, likely sequela of infection or inflammation. - Follow up with pulmonologist on August 6th for further evaluation of the mass.  Fatigue, post-infectious Persistent fatigue likely related to pneumonia recovery. Improving but still present. - Follow up with pulmonologist on August 6th.  Anemia of chronic disease, under evaluation Anemia under evaluation with previous low hyponatremia and anemia noted. - Order CBC and metabolic panel today to assess anemia and hyponatremia. - Evaluate lab results to determine further management.  Hyponatremia, under evaluation Hyponatremia noted in previous labs, under evaluation for relation to pneumonia or other conditions. - Order metabolic panel today to assess current sodium levels. - Evaluate lab results to determine further management.  Follow up date to be determined after lab  review and imaging review.   Time spent with patient today was 45  minutes which consisted of chart revdiew, discussing diagnosis, work up treatment and documentation.

## 2024-07-06 NOTE — Patient Instructions (Signed)
 Pneumonia, right lower lobe and lingula, resolving Pneumonia resolving with residual night sweats and fatigue. No cough or fever. Discharged on ceftin  after hospital treatment with ceftriaxone , Rocephin  and doxycycline . - Order chest x-ray today to assess current status. - Repeat CT scan in 4-6 weeks to ensure resolution. - Advise emergency department visit if significant chest pain occurs. - Consider another round of antibiotics if symptoms of chest congestion and cough with mucus develop. - Follow up with pulmonologist on August 6th.  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 New finding, likely sequela of infection or inflammation. - Follow up with pulmonologist on August 6th for further evaluation of the mass.  Fatigue, post-infectious Persistent fatigue likely related to pneumonia recovery. Improving but still present. - Follow up with pulmonologist on August 6th.  Anemia of chronic disease, under evaluation Anemia under evaluation with previous low hyponatremia and anemia noted. - Order CBC and metabolic panel today to assess anemia and hyponatremia. - Evaluate lab results to determine further management.  Hyponatremia, under evaluation Hyponatremia noted in previous labs, under evaluation for relation to pneumonia or other conditions. - Order metabolic panel today to assess current sodium levels. - Evaluate lab results to determine further management.  Follow up date to be determined after lab review and imaging review.

## 2024-07-07 ENCOUNTER — Inpatient Hospital Stay: Admitting: Medical

## 2024-07-07 LAB — COMPREHENSIVE METABOLIC PANEL WITH GFR
ALT: 16 U/L (ref 0–35)
AST: 17 U/L (ref 0–37)
Albumin: 4.3 g/dL (ref 3.5–5.2)
Alkaline Phosphatase: 75 U/L (ref 39–117)
BUN: 23 mg/dL (ref 6–23)
CO2: 29 meq/L (ref 19–32)
Calcium: 9.4 mg/dL (ref 8.4–10.5)
Chloride: 101 meq/L (ref 96–112)
Creatinine, Ser: 1.45 mg/dL — ABNORMAL HIGH (ref 0.40–1.20)
GFR: 36.45 mL/min — ABNORMAL LOW (ref >60.00)
Glucose, Bld: 86 mg/dL (ref 70–99)
Potassium: 4.2 meq/L (ref 3.5–5.1)
Sodium: 139 meq/L (ref 135–145)
Total Bilirubin: 0.5 mg/dL (ref 0.2–1.2)
Total Protein: 7.4 g/dL (ref 6.0–8.3)

## 2024-07-07 LAB — CBC WITH DIFFERENTIAL/PLATELET
Basophils Absolute: 0.1 K/uL (ref 0.0–0.1)
Basophils Relative: 0.5 % (ref 0.0–3.0)
Eosinophils Absolute: 0.2 K/uL (ref 0.0–0.7)
Eosinophils Relative: 1.4 % (ref 0.0–5.0)
HCT: 36.8 % (ref 36.0–46.0)
Hemoglobin: 12 g/dL (ref 12.0–15.0)
Lymphocytes Relative: 37 % (ref 12.0–46.0)
Lymphs Abs: 4 K/uL (ref 0.7–4.0)
MCHC: 32.5 g/dL (ref 30.0–36.0)
MCV: 86.1 fl (ref 78.0–100.0)
Monocytes Absolute: 0.7 K/uL (ref 0.1–1.0)
Monocytes Relative: 6.6 % (ref 3.0–12.0)
Neutro Abs: 5.8 K/uL (ref 1.4–7.7)
Neutrophils Relative %: 54.5 % (ref 43.0–77.0)
Platelets: 425 K/uL — ABNORMAL HIGH (ref 150.0–400.0)
RBC: 4.28 Mil/uL (ref 3.87–5.11)
RDW: 14.8 % (ref 11.5–15.5)
WBC: 10.7 K/uL — ABNORMAL HIGH (ref 4.0–10.5)

## 2024-07-07 NOTE — Addendum Note (Signed)
 Addended by: DORINA DALLAS HERO on: 07/07/2024 10:10 PM   Modules accepted: Orders

## 2024-07-13 ENCOUNTER — Ambulatory Visit: Admitting: Internal Medicine

## 2024-07-13 ENCOUNTER — Encounter (HOSPITAL_BASED_OUTPATIENT_CLINIC_OR_DEPARTMENT_OTHER): Payer: Self-pay

## 2024-07-13 ENCOUNTER — Ambulatory Visit (HOSPITAL_BASED_OUTPATIENT_CLINIC_OR_DEPARTMENT_OTHER)

## 2024-07-13 VITALS — BP 121/83 | HR 85

## 2024-07-13 DIAGNOSIS — J189 Pneumonia, unspecified organism: Secondary | ICD-10-CM | POA: Diagnosis not present

## 2024-07-13 MED ORDER — AMOXICILLIN-POT CLAVULANATE 875-125 MG PO TABS: 1.0000 | ORAL_TABLET | Freq: Two times a day (BID) | ORAL | 0 refills | Status: AC

## 2024-07-13 NOTE — Telephone Encounter (Signed)
**Note De-identified  [place] Obfuscation** Please advise 

## 2024-07-13 NOTE — Progress Notes (Signed)
 @Patient  ID: Judy [name], female    DOB: July 11, 1951, 73 y.o.   MRN: [id number]  Chief Complaint  Patient presents with   Follow-up    HOSPITAL FOLLOW UP    Referring provider: Saguier, [place], PA-C  HPI: Judy KATHEE. Bookwalter is a 73 y/o female with PMH of OSA, obesity, anemia, GERD, and hypertension who presents for follow up after recent hospitalization for CAP and atypical chest pain.  She was hospitalized 06/26/2024 through 06/29/2024 and was treated with IV antibiotics.  Chest CT completed as an inpatient revealed new focal areas of consolidation in both the left lingula and RLL with associated adjacent pleural thickening; these were new findings in comparison with Chest CT completed in May 2025.  Full impression below.    Clinical course: She reports that she started with an URI back in late May-early June that persisted for at least 3 weeks.  She felt that it had resolved, but was experiencing fatigue.  She started with right mid back pain that radiated through to the chest on 7/19; this persisted and prompted her ER visit on 7/20.  She was admitted and PE was ruled out with Chest CT with contrast.  Chest CT did reveal two areas concerning for pneumonia.  She was transitioned to oral antibiotics (reported Cefdinir) at discharge for 3 more days worth of treatment following inpatient IV antibiotics.  She reports that she finished those medications but still struggles with fatigue.  She is also experiencing night sweats and has lost 8-10 pounds in the last 1-2 months.  She reports that her mother was a lifelong smoker and died of lung cancer at the age of 71 which concerns her.  She currently denies cough, fevers, chills, chest or back pain, dizziness, lightheadedness.  TEST/EVENTS : Hospitalization 7/20-7/23 for CAP Chest CT 06/26/2024:  IMPRESSION: 1. No evidence for acute pulmonary embolus. 2. Right lower lobe, paravertebral masslike architectural distortion measuring 3.7 by 1.9 cm of the lung  windows. Adjacent pleural thickening is noted along the posterior right lower lobe. These changes are new from 04/18/2024. Findings are favored to represent sequelae of infectious or inflammatory process. Suggest follow-up imaging to ensure resolution. 3. Focal area of consolidative change within the lingula is also new from previous exam measuring 2.7 by 1.5 cm. Also likely sequelae of inflammatory or infectious process. 4.  Aortic Atherosclerosis (ICD10-I70.0).  Allergies  Allergen Reactions   Metrizamide Shortness Of Breath and Swelling    Swelling mouth   Atorvastatin Other (See Comments)    Muscle aches requiring increased use of pain medication Muscle aches requiring increased use of pain medication    Immunization History  Administered Date(s) Administered   PFIZER(Purple Top)SARS-COV-2 Vaccination 01/17/2020, 02/14/2020, 08/14/2020   Pfizer Covid-19 Vaccine Bivalent Booster 25yrs & up 09/30/2021, 04/21/2022    Past Medical History:  Diagnosis Date   Abnormal CT of spine 03/28/2013   ?? 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 Overview:  ?? 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 Last Assessment & Plan:  Well controlled, no changes to meds. Encouraged heart healthy diet such as the DASH diet and exercise as tolerated.  Overview:  Last Assessment & Plan:  Well controlled.  Continue current medications and low sodium Dash type diet. Formatting of this note might be  different from the original. Last Assessment & Pl   BPPV (benign paroxysmal positional vertigo), left 03/06/2021   Calcific tendinitis of right shoulder 01/31/2021   Cataract 2020   bilateral   Cervical radiculopathy 08/13/2021   Depression    Dysplasia of cervix    Elevated erythrocyte sedimentation rate 09/25/2021   Essential  hypertension 09/18/2020   Family history of breast cancer in sister 01/18/2013   CA in paternal half sister x 2 and maternal GGM  Formatting of this note might be different from the original. Overview:  CA in paternal half sister x 2 and maternal GGM   Fatigue 09/25/2021   Gastric polyp 01/18/2013   Dr Zulema.  Advised repeat 09/2012    Gastroesophageal reflux disease 06/14/2012   GERD (gastroesophageal reflux disease)    Hearing loss 09/25/2021   Hip impingement syndrome, left 09/19/2021   History of blood transfusion    History of cervical dysplasia 06/15/2012   History of gastric polyp 06/25/2013   History of shingles 02/14/2016   Hyperlipidemia    Hypertension    Insomnia 05/18/2013   Intervertebral disc protrusion 08/22/2013   See MRI  03/2013    Left rotator cuff tear 08/17/2014   See MRI 2015    Leg swelling 08/03/2013   Venous dopplers neg 08/04/13     Lumbar spondylosis 07/03/2013   Meniere disease, 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 Last Assessment & Plan:  On chronic Celebrex , refill provided   Other and unspecified hyperlipidemia 06/14/2012   Other long term (current) drug therapy 09/25/2021   Other specified abnormal immunological findings in serum 09/25/2021   Pain in limb 09/25/2021   Personal history of colonic polyps 01/18/2013   Personal history of renal calculi 02/23/2013   nonobstructing stone L kidney   Formatting of this note might be different from the original. 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 Last Assessment & Plan:  Improvement with the  glenohumeral injection. -Counseled on home exercise therapy and supportive care. -Could consider physical therapy.   Pulmonary embolism (HCC) 08/1975   Pulmonary hypertension (HCC) 08/03/2013   H/o PE VQ, duplex neg 08/2013 - Echo 08/15/2013 >>PA peak pressure: 37mm Hg   Formatting of this note might be different from the original. Overview:  H/o PE VQ, duplex neg 08/2013 - Echo 08/15/2013 >>PA peak pressure: 37mm Hg  Last Assessment & Plan:  Rpt echo in 1 yr   Rheumatoid arthritis (HCC) 04/09/2020   Right wrist pain 02/22/2020   S/P hysterectomy 06/14/2012   Pap 10/2011 negative  Formatting of this note might be different from the original. 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  On Prozac in past  Formatting of this note might be different from the original. Overview:  Per pt.  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     buPROPion  (WELLBUTRIN  XL) 300 MG 24 hr tablet TAKE 1 TABLET BY MOUTH  EVERY DAY 30 tablet 3   candesartan  (ATACAND ) 32 MG tablet Take 1 tablet (32 mg total) by mouth daily. 90 tablet 0   Cholecalciferol (VITAMIN D ) 1000 UNITS capsule Take 1,000 Units by mouth daily.     Evolocumab  (REPATHA  SURECLICK) 140 MG/ML SOAJ INJECT 140 MG INTO THE SKIN EVERY 14 (FOURTEEN) DAYS. (Patient taking differently: Inject 140 mg into the skin every 14 (fourteen) days.) 2 mL 2   famotidine  (PEPCID ) 20 MG tablet Take 1 tablet (20 mg total) by mouth as needed for heartburn or indigestion (if protonix  doesn't work).     inFLIXimab -abda (RENFLEXIS ) 100 MG SOLR Inject 100 mg into the vein every 8 (eight) weeks. Remicaid infusion every 8 weeks for RA     MEDROL  4 MG tablet Take 4 mg by mouth daily.     methocarbamol  (ROBAXIN ) 500 MG tablet Take 1 tablet (500 mg total) by mouth every 6 (six) hours as needed for muscle spasms. 30 tablet 0   methotrexate  50 MG/2ML injection Inject 25 mg into the skin once a week. On Saturdays     ondansetron  (ZOFRAN -ODT) 4 MG disintegrating tablet Take 4 mg by mouth every 8 (eight) hours as needed for vomiting or nausea.     oxyCODONE  (ROXICODONE ) 5 MG immediate release tablet Take 1 tablet (5 mg total) by mouth every 6 (six) hours as needed for moderate pain (pain score 4-6) or severe pain (pain score 7-10). 20 tablet 0   pantoprazole  (PROTONIX ) 40 MG tablet TAKE 1 TABLET BY MOUTH EVERY DAY 30 tablet 29   solifenacin  (VESICARE ) 10 MG tablet Take 1 tablet (10 mg total) by mouth as needed.     No facility-administered medications prior to visit.     Review of Systems:   Constitutional:   Positive for weight loss, night sweats,fatigue  No  Fevers, chills, , or  lassitude.  HEENT:   No headaches,  Difficulty swallowing,  Tooth/dental problems, or  Sore throat,                No sneezing, itching, ear ache, nasal congestion, post nasal drip,   CV:  No chest pain,  Orthopnea, PND, swelling in lower extremities, anasarca, dizziness, palpitations, syncope.    GI  No heartburn, indigestion, abdominal pain, nausea, vomiting, diarrhea, change in bowel habits, loss of appetite, bloody stools.   Resp: No shortness of breath with exertion or at rest.  No excess mucus, no productive cough,  No non-productive cough,  No coughing up of blood.  No change in color of mucus.  No wheezing.  No chest wall deformity  Skin: no rash or  lesions.  GU: no dysuria, change in color of urine, no urgency or frequency.  No flank pain, no hematuria   MS:  No joint pain or swelling.  No decreased range of motion.  No back pain.    Physical Exam  BP 121/83 (BP Location: Left Arm, Patient Position: Sitting, Cuff Size: Normal)   Pulse 85   SpO2 91%   GEN: A/Ox3; pleasant , NAD, well nourished.  Appears tired.   HEENT:  Johnson/AT,  EACs-clear, TMs-wnl, NOSE-clear, THROAT-clear, no lesions, no postnasal drip or exudate noted.   NECK:  Supple w/ fair ROM; no JVD; normal carotid impulses w/o bruits; no thyromegaly or nodules palpated; no lymphadenopathy.    RESP  Clear  P & A; w/o, wheezes/ rales/ or rhonchi. no accessory muscle use, no dullness to percussion  Few crackles LLL  CARD:  RRR, no m/r/g, no peripheral edema, pulses intact, no cyanosis or clubbing.  GI:   Soft & nt; nml bowel sounds; no organomegaly or masses detected.   Musco: Warm bil, no deformities or joint swelling noted.   Neuro: alert, no focal deficits noted.    Skin: Warm, no lesions or rashes    Lab Results:  CBC    Component Value Date/Time   WBC 10.7 (H) 07/06/2024 1601   RBC 4.28 07/06/2024 1601   HGB 12.0 07/06/2024 1601   HGB 12.2 01/21/2021 0820   HCT 36.8 07/06/2024 1601   HCT 38.1 01/21/2021 0820   PLT 425.0 (H) 07/06/2024 1601   PLT 363 01/21/2021 0820   MCV 86.1 07/06/2024 1601   MCV 87 01/21/2021 0820   MCH 28.0 06/28/2024 0505   MCHC 32.5 07/06/2024 1601   RDW 14.8 07/06/2024 1601   RDW 14.5 01/21/2021 0820   LYMPHSABS 4.0 07/06/2024 1601   LYMPHSABS 3.2 (H)  01/21/2021 0820   MONOABS 0.7 07/06/2024 1601   EOSABS 0.2 07/06/2024 1601   EOSABS 0.1 01/21/2021 0820   BASOSABS 0.1 07/06/2024 1601   BASOSABS 0.0 01/21/2021 0820    BMET    Component Value Date/Time   NA 139 07/06/2024 1601   NA 140 01/13/2024 1103   K 4.2 07/06/2024 1601   CL 101 07/06/2024 1601   CO2 29 07/06/2024 1601   GLUCOSE 86 07/06/2024 1601   BUN 23 07/06/2024 1601   BUN 15 01/13/2024 1103   CREATININE 1.45 (H) 07/06/2024 1601   CREATININE 0.80 04/02/2015 1508   CALCIUM  9.4 07/06/2024 1601   GFRNONAA 54 (L) 06/28/2024 0505   GFRAA 78 01/21/2021 0820    BNP No results found for: BNP  ProBNP    Component Value Date/Time   PROBNP 38.0 03/30/2024 1505    Imaging: CT ABDOMEN PELVIS WO CONTRAST Result Date: 07/10/2024 EXAM: CT ABDOMEN AND PELVIS WITHOUT CONTRAST 07/05/2024 01:07:04 PM TECHNIQUE: CT of the abdomen and pelvis was performed without the administration of intravenous contrast. Multiplanar reformatted images are provided for review. Automated exposure control, iterative reconstruction, and/or weight based adjustment of the mA/kV was utilized to reduce the radiation dose to as low as reasonably achievable. COMPARISON: CTA chest 07/01/2024, and previous. CLINICAL HISTORY: Recurrent ventral hernia. Abdominal pain for about 2 months. FINDINGS: LOWER CHEST: No acute abnormality. LIVER: The liver is unremarkable. GALLBLADDER AND BILE DUCTS: Gallbladder is unremarkable. No biliary ductal dilatation. SPLEEN: No acute abnormality. PANCREAS: No acute abnormality. ADRENAL GLANDS: No acute abnormality. KIDNEYS, URETERS AND BLADDER: 1 cm low attenuation lesion in the mid right kidney, stable since 06/18/2023, possibly small cyst. No  urolithiasis or hydronephrosis. No perinephric or periureteral stranding. Urinary bladder is unremarkable. GI AND BOWEL: Stomach demonstrates no acute abnormality. There is no bowel obstruction. No bowel wall thickening. Normal appendix.  PERITONEUM AND RETROPERITONEUM: No ascites. No free air. VASCULATURE: Aorta is normal in caliber. LYMPH NODES: No lymphadenopathy. REPRODUCTIVE ORGANS: Post hysterectomy. No adnexal mass. BONES AND SOFT TISSUES: No acute osseous abnormality. Rectus diastasis above the umbilicus. Small umbilical hernia containing only fat, stable since 06/18/2023. IMPRESSION: 1. Stable small umbilical hernia containing only fat. 2. No acute findings Electronically signed by: Katheleen Faes MD 07/10/2024 08:32 PM EDT RP Workstation: [id number]   DG Chest 2 View Result Date: 07/06/2024 CLINICAL DATA:  Fatigue and chills.  Pneumonia EXAM: CHEST - 2 VIEW COMPARISON:  None Available. FINDINGS: Normal mediastinum and cardiac silhouette. Normal pulmonary vasculature. No evidence of effusion, infiltrate, or pneumothorax. No acute bony abnormality. IMPRESSION: No acute cardiopulmonary process. Electronically Signed   By: Jackquline Boxer M.D.   On: 07/06/2024 16:34   ECHOCARDIOGRAM COMPLETE Result Date: 06/27/2024    ECHOCARDIOGRAM REPORT   Patient Name:   GLYNIS HUNSUCKER Date of Exam: 06/27/2024 Medical Rec #:  [id number]    Height:       68.0 in Accession #:    [id number]   Weight:       224.0 lb Date of Birth:  08-31-1951    BSA:          2.145 m Patient Age:    72 years     BP:           110/48 mmHg Patient Gender: F            HR:           77 bpm. Exam Location:  Inpatient Procedure: 2D Echo, Color Doppler and Cardiac Doppler (Both Spectral and Color            Flow Doppler were utilized during procedure). Indications:    R07.9* Chest pain, unspecified  History:        Patient has prior history of Echocardiogram examinations, most                 recent 10/12/2020. Risk Factors:Hypertension, Dyslipidemia and                 Sleep Apnea.  Sonographer:    Damien Senior RDCS Referring Phys: [id number] ATLEE SIRA IMPRESSIONS  1. Left ventricular ejection fraction, by estimation, is 55 to 60%. The left ventricle has normal function. The left  ventricle has no regional wall motion abnormalities. Left ventricular diastolic parameters are consistent with Grade I diastolic dysfunction (impaired relaxation).  2. Right ventricular systolic function is normal. The right ventricular size is normal. There is normal pulmonary artery systolic pressure. The estimated right ventricular systolic pressure is 23.1 mmHg.  3. The mitral valve is normal in structure. Trivial mitral valve regurgitation.  4. The aortic valve has an indeterminant number of cusps. Aortic valve regurgitation is not visualized.  5. The inferior vena cava is normal in size with greater than 50% respiratory variability, suggesting right atrial pressure of 3 mmHg. Comparison(s): No significant change from prior study. Prior images reviewed side by side. FINDINGS  Left Ventricle: Left ventricular ejection fraction, by estimation, is 55 to 60%. The left ventricle has normal function. The left ventricle has no regional wall motion abnormalities. The left ventricular internal cavity size was normal in size. There is  no left ventricular hypertrophy. Left ventricular diastolic parameters are  consistent with Grade I diastolic dysfunction (impaired relaxation). Normal left ventricular filling pressure. Right Ventricle: The right ventricular size is normal. No increase in right ventricular wall thickness. Right ventricular systolic function is normal. There is normal pulmonary artery systolic pressure. The tricuspid regurgitant velocity is 2.24 m/s, and  with an assumed right atrial pressure of 3 mmHg, the estimated right ventricular systolic pressure is 23.1 mmHg. Left Atrium: Left atrial size was normal in size. Right Atrium: Right atrial size was normal in size. Pericardium: There is no evidence of pericardial effusion. Presence of epicardial fat layer. Mitral Valve: The mitral valve is normal in structure. Trivial mitral valve regurgitation. Tricuspid Valve: The tricuspid valve is normal in structure.  Tricuspid valve regurgitation is trivial. Aortic Valve: The aortic valve has an indeterminant number of cusps. Aortic valve regurgitation is not visualized. Pulmonic Valve: The pulmonic valve was normal in structure. Pulmonic valve regurgitation is trivial. Aorta: The aortic root and ascending aorta are structurally normal, with no evidence of dilitation. Venous: The inferior vena cava is normal in size with greater than 50% respiratory variability, suggesting right atrial pressure of 3 mmHg. IAS/Shunts: No atrial level shunt detected by color flow Doppler.  LEFT VENTRICLE PLAX 2D LVIDd:         5.10 cm   Diastology LVIDs:         3.70 cm   LV e' medial:    8.16 cm/s LV PW:         0.90 cm   LV E/e' medial:  8.6 LV IVS:        0.90 cm   LV e' lateral:   10.80 cm/s LVOT diam:     2.00 cm   LV E/e' lateral: 6.5 LV SV:         56 LV SV Index:   26 LVOT Area:     3.14 cm  RIGHT VENTRICLE RV S prime:     13.10 cm/s TAPSE (M-mode): 2.4 cm LEFT ATRIUM             Index        RIGHT ATRIUM           Index LA diam:        3.70 cm 1.73 cm/m   RA Area:     17.20 cm LA Vol (A2C):   34.9 ml 16.27 ml/m  RA Volume:   44.50 ml  20.75 ml/m LA Vol (A4C):   60.5 ml 28.21 ml/m LA Biplane Vol: 46.3 ml 21.59 ml/m  AORTIC VALVE LVOT Vmax:   87.80 cm/s LVOT Vmean:  69.700 cm/s LVOT VTI:    0.178 m  AORTA Ao Root diam: 3.00 cm Ao Asc diam:  3.10 cm MITRAL VALVE               TRICUSPID VALVE MV Area (PHT): 3.16 cm    TR Peak grad:   20.1 mmHg MV Decel Time: 240 msec    TR Vmax:        224.00 cm/s MV E velocity: 70.20 cm/s MV A velocity: 84.90 cm/s  SHUNTS MV E/A ratio:  0.83        Systemic VTI:  0.18 m                            Systemic Diam: 2.00 cm Jerel Croitoru MD Electronically signed by Jerel Balding MD Signature Date/Time: 06/27/2024/3:05:22 PM    Final    CT Angio  Chest PE W and/or Wo Contrast Result Date: 06/26/2024 CLINICAL DATA:  Evaluate for pulmonary embolism. Low to intermediate probability. Positive D-dimer. EXAM:  CT ANGIOGRAPHY CHEST WITH CONTRAST TECHNIQUE: Multidetector CT imaging of the chest was performed using the standard protocol during bolus administration of intravenous contrast. Multiplanar CT image reconstructions and MIPs were obtained to evaluate the vascular anatomy. RADIATION DOSE REDUCTION: This exam was performed according to the departmental dose-optimization program which includes automated exposure control, adjustment of the mA and/or kV according to patient size and/or use of iterative reconstruction technique. CONTRAST:  75mL OMNIPAQUE  IOHEXOL  350 MG/ML SOLN COMPARISON:  04/18/2024 FINDINGS: Cardiovascular: Satisfactory opacification of the pulmonary arteries to the segmental level. No evidence of pulmonary embolism. Normal heart size. No pericardial effusion. Aortic atherosclerotic calcifications. Mediastinum/Nodes: No significant mediastinal or hilar adenopathy. The thyroid  gland, trachea and esophagus are unremarkable. Lungs/Pleura: There is no pleural effusion or pneumothorax. New subpleural consolidative change is identified within the paravertebral right lower lobe with adjacent pleural thickening. Right lower lobe, paravertebral masslike architectural distortion measuring 3.7 by 1.9 cm axial image 86 of the lung windows. Adjacent pleural thickening is noted along the posterior right lower lobe. These changes are new from 04/18/2024. Focal area of consolidative change within the lingula is also new from previous exam measuring 2.7 by 1.5 cm. Central airways are patent. Upper Abdomen: No acute abnormality. Postsurgical changes involving the stomach. Musculoskeletal: No chest wall abnormality. No acute or significant osseous findings. Review of the MIP images confirms the above findings. IMPRESSION: 1. No evidence for acute pulmonary embolus. 2. Right lower lobe, paravertebral masslike architectural distortion measuring 3.7 by 1.9 cm of the lung windows. Adjacent pleural thickening is noted along  the posterior right lower lobe. These changes are new from 04/18/2024. Findings are favored to represent sequelae of infectious or inflammatory process. Suggest follow-up imaging to ensure resolution. 3. Focal area of consolidative change within the lingula is also new from previous exam measuring 2.7 by 1.5 cm. Also likely sequelae of inflammatory or infectious process. 4.  Aortic Atherosclerosis (ICD10-I70.0). Electronically Signed   By: Waddell Calk M.D.   On: 06/26/2024 09:43   DG Chest 2 View Result Date: 06/26/2024 CLINICAL DATA:  73 year old female with chest pain. EXAM: CHEST - 2 VIEW COMPARISON:  Chest CT without contrast 04/18/2024 and earlier. FINDINGS: PA and lateral views 0831 hours. Pectus excavatum. Stable borderline to mild cardiomegaly. Other mediastinal contours are within normal limits. Lower lung volumes compared to 2022 on the lateral. Visualized tracheal air column is within normal limits. No pneumothorax, pulmonary edema, pleural effusion or confluent lung opacity. No acute osseous abnormality identified. Negative visible bowel gas. IMPRESSION: 1. No acute cardiopulmonary abnormality. 2. Pectus excavatum. Stable borderline to mild cardiomegaly. Electronically Signed   By: VEAR Hurst M.D.   On: 06/26/2024 08:52    Administration History     None           No data to display          No results found for: NITRICOXIDE   Assessment & Plan:  Ms. Dorraine Ellender. Peifer is a 73 y/o female with PMH of OSA and RA on Remicade  who presents for follow up after recent hospitalization for CAP.  She continues to remain somewhat symptomatic at this point. Assessment & Plan Community acquired pneumonia, unspecified laterality -  Given her constitutional symptoms and findings on exam, plan to treat with another full course of antibiotics for persistent pneumonia; no utility in re-imaging at this  point -  Repeat Chest CT in 6 weeks; follow to complete resolution -  May require PET scan vs.  Bronchoscopy for further evaluation pending results of CT and clinical course in the setting of FH of lung CA   Return in about 7 weeks (around 08/31/2024) for CT results.  Candis Dandy, PA-C 07/13/2024

## 2024-07-13 NOTE — Patient Instructions (Addendum)
 Complete Augmentin  course as ordered; Rx sent to pharmacy of choice.  Follow up chest CT as ordered at 6 week mark.  Return to clinic for follow up after Chest CT completed.

## 2024-07-13 NOTE — Assessment & Plan Note (Signed)
-    Given her constitutional symptoms and findings on exam, plan to treat with another full course of antibiotics for persistent pneumonia; no utility in re-imaging at this point -  Repeat Chest CT in 6 weeks; follow to complete resolution -  May require PET scan vs. Bronchoscopy for further evaluation pending results of CT and clinical course in the setting of FH of lung CA

## 2024-07-14 ENCOUNTER — Other Ambulatory Visit (INDEPENDENT_AMBULATORY_CARE_PROVIDER_SITE_OTHER)

## 2024-07-14 ENCOUNTER — Encounter: Payer: Self-pay | Admitting: Medical

## 2024-07-14 DIAGNOSIS — R944 Abnormal results of kidney function studies: Secondary | ICD-10-CM | POA: Diagnosis not present

## 2024-07-14 LAB — COMPREHENSIVE METABOLIC PANEL WITH GFR
ALT: 9 U/L (ref 0–35)
AST: 14 U/L (ref 0–37)
Albumin: 4.1 g/dL (ref 3.5–5.2)
Alkaline Phosphatase: 66 U/L (ref 39–117)
BUN: 15 mg/dL (ref 6–23)
CO2: 24 meq/L (ref 19–32)
Calcium: 9 mg/dL (ref 8.4–10.5)
Chloride: 101 meq/L (ref 96–112)
Creatinine, Ser: 1.13 mg/dL (ref 0.40–1.20)
GFR: 49.16 mL/min — ABNORMAL LOW (ref >60.00)
Glucose, Bld: 83 mg/dL (ref 70–99)
Potassium: 3.9 meq/L (ref 3.5–5.1)
Sodium: 136 meq/L (ref 135–145)
Total Bilirubin: 0.7 mg/dL (ref 0.2–1.2)
Total Protein: 6.9 g/dL (ref 6.0–8.3)

## 2024-07-15 ENCOUNTER — Other Ambulatory Visit (HOSPITAL_BASED_OUTPATIENT_CLINIC_OR_DEPARTMENT_OTHER): Payer: Self-pay | Admitting: Medical

## 2024-07-15 DIAGNOSIS — Z1231 Encounter for screening mammogram for malignant neoplasm of breast: Secondary | ICD-10-CM

## 2024-07-17 ENCOUNTER — Other Ambulatory Visit: Payer: Self-pay | Admitting: Medical Genetics

## 2024-07-18 ENCOUNTER — Ambulatory Visit (HOSPITAL_BASED_OUTPATIENT_CLINIC_OR_DEPARTMENT_OTHER)
Admission: RE | Admit: 2024-07-18 | Discharge: 2024-07-18 | Disposition: A | Discharge status: Home / Self Care | Source: Ambulatory Visit

## 2024-07-18 ENCOUNTER — Telehealth: Payer: Self-pay | Admitting: Surgery

## 2024-07-18 ENCOUNTER — Encounter (HOSPITAL_BASED_OUTPATIENT_CLINIC_OR_DEPARTMENT_OTHER): Payer: Self-pay | Admitting: Radiology

## 2024-07-18 DIAGNOSIS — Z1231 Encounter for screening mammogram for malignant neoplasm of breast: Secondary | ICD-10-CM | POA: Insufficient documentation

## 2024-07-18 NOTE — Telephone Encounter (Signed)
 Abdominal CT reviewed and discussed with patient. Tiny umbilical defect and mild supraumbilical diastasis, no overt hernia in the area of discomfort. Given CT appearance and other medical issues at this point I recommend holding off on any surgical intervention for this. If symptoms worsen/ progress she will let us  know and I will have her meet with one of the robotic hernia specialists for an opinion.

## 2024-07-20 DIAGNOSIS — Z961 Presence of intraocular lens: Secondary | ICD-10-CM | POA: Diagnosis not present

## 2024-07-20 DIAGNOSIS — H43813 Vitreous degeneration, bilateral: Secondary | ICD-10-CM | POA: Diagnosis not present

## 2024-07-20 DIAGNOSIS — H10502 Unspecified blepharoconjunctivitis, left eye: Secondary | ICD-10-CM | POA: Diagnosis not present

## 2024-07-21 ENCOUNTER — Encounter: Payer: Self-pay | Admitting: Medical

## 2024-07-21 ENCOUNTER — Ambulatory Visit: Payer: Self-pay | Admitting: Medical

## 2024-07-21 ENCOUNTER — Ambulatory Visit (INDEPENDENT_AMBULATORY_CARE_PROVIDER_SITE_OTHER): Admitting: Medical

## 2024-07-21 ENCOUNTER — Other Ambulatory Visit: Payer: Self-pay

## 2024-07-21 VITALS — BP 128/80 | HR 94 | Temp 98.0°F | Resp 16 | Ht 68.0 in | Wt 216.2 lb

## 2024-07-21 DIAGNOSIS — R5383 Other fatigue: Secondary | ICD-10-CM | POA: Diagnosis not present

## 2024-07-21 DIAGNOSIS — D72829 Elevated white blood cell count, unspecified: Secondary | ICD-10-CM | POA: Diagnosis not present

## 2024-07-21 DIAGNOSIS — M25511 Pain in right shoulder: Secondary | ICD-10-CM | POA: Diagnosis not present

## 2024-07-21 DIAGNOSIS — Z8701 Personal history of pneumonia (recurrent): Secondary | ICD-10-CM | POA: Diagnosis not present

## 2024-07-21 LAB — CBC WITH DIFFERENTIAL/PLATELET
Basophils Absolute: 0 K/uL (ref 0.0–0.1)
Basophils Relative: 0.6 % (ref 0.0–3.0)
Eosinophils Absolute: 0.2 K/uL (ref 0.0–0.7)
Eosinophils Relative: 1.9 % (ref 0.0–5.0)
HCT: 37.7 % (ref 36.0–46.0)
Hemoglobin: 12.2 g/dL (ref 12.0–15.0)
Lymphocytes Relative: 40.9 % (ref 12.0–46.0)
Lymphs Abs: 3.3 K/uL (ref 0.7–4.0)
MCHC: 32.3 g/dL (ref 30.0–36.0)
MCV: 86.2 fl (ref 78.0–100.0)
Monocytes Absolute: 0.7 K/uL (ref 0.1–1.0)
Monocytes Relative: 8.2 % (ref 3.0–12.0)
Neutro Abs: 3.9 K/uL (ref 1.4–7.7)
Neutrophils Relative %: 48.4 % (ref 43.0–77.0)
Platelets: 354 K/uL (ref 150.0–400.0)
RBC: 4.38 Mil/uL (ref 3.87–5.11)
RDW: 15.3 % (ref 11.5–15.5)
WBC: 8 K/uL (ref 4.0–10.5)

## 2024-07-21 MED ORDER — METHYLPREDNISOLONE 4 MG PO TABS: ORAL_TABLET | ORAL | 0 refills | Status: DC

## 2024-07-21 MED ORDER — CYCLOBENZAPRINE HCL 5 MG PO TABS: 5.0000 mg | ORAL_TABLET | Freq: Every day | ORAL | 0 refills | Status: DC
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
## 2024-07-21 NOTE — Patient Instructions (Signed)
 Right shoulder pain with limited range of motion Acute pain and stiffness post-incident, NSAIDs contraindicated due to CKD. - Prescribe Medrol  4 mg, 6-day taper. - Prescribe Flexeril  5 mg at night for 3 nights. - Consider referral to [facility] if symptoms persist by Monday. -Mild rom as tolerated  Recent pneumonia Persistent fatigue and night sweats suggest possible ongoing infection or inflammation post-treatment. - Monitor symptoms and update on Monday. - Coordinate with pulmonologist for further evaluation/ regarding possible ealier ct chest. -just finished augmentin . -Repeat cbc today.  Abnormal chest CT with right lower lobe pleural thickening Pleural thickening suggests infection or inflammation; CT may reveal pneumonia not seen on x-ray. - Coordinate with pulmonologist for repeat chest CT timing. - Consider earlier CT if symptoms persist or worsen.  Fatigue and episodic weakness - Check CBC for infection. - Check metabolic panel and blood sugar levels.  Chronic kidney disease, stage 3a GFR 49, NSAIDs contraindicated. - Avoid NSAIDs. - Use Medrol  and Flexeril  for pain management.  Follow up date to be determined after lab review and update by mychart on monday. Give update on how feel overall as well as if less shoulder pain and better rom?

## 2024-07-22 LAB — COMPLETE METABOLIC PANEL WITHOUT GFR
AG Ratio: 1.4 (calc) (ref 1.0–2.5)
ALT: 9 U/L (ref 6–29)
AST: 14 U/L (ref 10–35)
Albumin: 4.2 g/dL (ref 3.6–5.1)
Alkaline phosphatase (APISO): 65 U/L (ref 37–153)
BUN/Creatinine Ratio: 11 (calc) (ref 6–22)
BUN: 14 mg/dL (ref 7–25)
CO2: 29 mmol/L (ref 20–32)
Calcium: 9.5 mg/dL (ref 8.6–10.4)
Chloride: 100 mmol/L (ref 98–110)
Creat: 1.33 mg/dL — ABNORMAL HIGH (ref 0.60–1.00)
Globulin: 2.9 g/dL (ref 1.9–3.7)
Glucose, Bld: 91 mg/dL (ref 65–99)
Potassium: 4.1 mmol/L (ref 3.5–5.3)
Sodium: 136 mmol/L (ref 135–146)
Total Bilirubin: 1.3 mg/dL — ABNORMAL HIGH (ref 0.2–1.2)
Total Protein: 7.1 g/dL (ref 6.1–8.1)

## 2024-07-23 ENCOUNTER — Other Ambulatory Visit: Payer: Self-pay | Admitting: "Endocrinology

## 2024-07-26 ENCOUNTER — Ambulatory Visit (INDEPENDENT_AMBULATORY_CARE_PROVIDER_SITE_OTHER)

## 2024-07-26 VITALS — Ht 68.0 in | Wt 216.0 lb

## 2024-07-26 DIAGNOSIS — Z0000 Encounter for general adult medical examination without abnormal findings: Secondary | ICD-10-CM | POA: Diagnosis not present

## 2024-07-26 NOTE — Patient Instructions (Addendum)
 Judy [name] , Thank you for taking time out of your busy schedule to complete your Annual Wellness Visit with me. I enjoyed our conversation and look forward to speaking with you again next year. I, as well as your care team,  appreciate your ongoing commitment to your health goals. Please review the following plan we discussed and let me know if I can assist you in the future. Your Game plan/ To Do List     Follow up Visits: We will see or speak with you next year for your Next Medicare AWV with our clinical staff Have you seen your provider in the last 6 months (3 months if uncontrolled diabetes)? Yes  Clinician Recommendations:  Aim for 30 minutes of exercise or brisk walking, 6-8 glasses of water , and 5 servings of fruits and vegetables each day.       This is a list of the screenings recommended for you:  Health Maintenance  Topic Date Due   Flu Shot  07/08/2024   Pneumococcal Vaccine for age over 101 (1 of 2 - PCV) 07/10/2025*   Zoster (Shingles) Vaccine (1 of 2) 07/11/2025*   Hepatitis C Screening  07/18/2025*   Medicare Annual Wellness Visit  07/26/2025   Mammogram  07/18/2026   Colon Cancer Screening  04/09/2031   DEXA scan (bone density measurement)  Completed   HPV Vaccine  Aged Out   Meningitis B Vaccine  Aged Out   DTaP/Tdap/Td vaccine  Discontinued   COVID-19 Vaccine  Discontinued  *Topic was postponed. The date shown is not the original due date.    Advanced directives: (ACP Link)Information on Advanced Care Planning can be found at [place]  Secretary of [facility] Advance Health Care Directives Advance Health Care Directives. [email or web address]   Advance Care Planning is important because it:  [x]  Makes sure you receive the medical care that is consistent with your values, goals, and preferences  [x]  It provides guidance to your family and loved ones and reduces their decisional burden about whether or not they are making the right decisions based on your wishes.  Follow the  link provided in your after visit summary or read over the paperwork we have mailed to you to help you started getting your Advance Directives in place. If you need assistance in completing these, please reach out to us  so that we can help you!  See attachments for Preventive Care and Fall Prevention Tips.
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
## 2024-07-27 ENCOUNTER — Ambulatory Visit (INDEPENDENT_AMBULATORY_CARE_PROVIDER_SITE_OTHER): Admitting: Sports Medicine

## 2024-07-27 ENCOUNTER — Ambulatory Visit (INDEPENDENT_AMBULATORY_CARE_PROVIDER_SITE_OTHER)

## 2024-07-27 VITALS — BP 120/72 | HR 68 | Ht 68.0 in

## 2024-07-27 DIAGNOSIS — S46011A Strain of muscle(s) and tendon(s) of the rotator cuff of right shoulder, initial encounter: Secondary | ICD-10-CM

## 2024-07-27 DIAGNOSIS — M25511 Pain in right shoulder: Secondary | ICD-10-CM | POA: Diagnosis not present

## 2024-07-27 DIAGNOSIS — M19011 Primary osteoarthritis, right shoulder: Secondary | ICD-10-CM | POA: Diagnosis not present

## 2024-07-27 NOTE — Patient Instructions (Addendum)
 Good to see you  Injection given in right shoulder today  Rotator cuff HEP given Follow up in 3 weeks

## 2024-07-27 NOTE — Progress Notes (Signed)
 Ben Tacie Mccuistion D.[facility] [address] [place] [address] Phone: [phone]   Assessment and Plan:     1. Acute pain of right shoulder 2. Rotator cuff strain, right, initial encounter  -Acute, initial visit - 1 week of right shoulder pain most consistent with rotator cuff strain - Patient elected to proceed with subacromial CSI.  Tolerated well per note below.  CSI might temporarily increase blood pressure in patient with past medical history of hypertension - Patient recently had mild increase in creatinine levels, so we will not proceed with NSAID course such as Celebrex  at this time - Start HEP for rotator cuff - X-ray obtained in clinic.  My interpretation: No acute fracture or dislocation mild glenohumeral and AC joint degenerative changes  Procedure: Subacromial Injection Side: Right  Risks explained and consent was given verbally. The site was cleaned with alcohol prep. A steroid injection was performed from posterior approach using 2mL of 1% lidocaine  without epinephrine  and 1mL of kenalog  40mg /ml. This was well tolerated.  Needle was removed, hemostasis achieved, and post injection instructions were explained.   Pt was advised to call or return to clinic if these symptoms worsen or fail to improve as anticipated.   15 additional minutes spent for educating Therapeutic Home Exercise Program.  This included exercises focusing on stretching, strengthening, with focus on eccentric aspects.   Long term goals include an improvement in range of motion, strength, endurance as well as avoiding reinjury. Patient's frequency would include in 1-2 times a day, 3-5 times a week for a duration of 6-12 weeks. Proper technique shown and discussed handout in great detail with ATC.  All questions were discussed and answered.    Pertinent previous records reviewed include none  Follow Up: 3 to 4 weeks for reevaluation.  Could consider ultrasound if no  improvement or worsening of symptoms   Subjective:    Chief Complaint: right shoulder pain   HPI:     07/27/24 Patient had injury and continuing pain just below right shoulder. Injury happened one week ago. Patient states taking garbage out and there was a racoon was in it. Not sure what exactly happened but she was done she was in pain. Locates pain below lateral right shoulder. Hurts worse to lift are and does feel weakness, no numbness or tingling. Used ice and aleve  in the beginning. Patient cannot use that arm and is leaving for vacation tomorrow.    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   Current Outpatient Medications:    bismuth subsalicylate (PEPTO BISMOL) 262 MG/15ML suspension, Take 30 mLs by mouth every 6 (six) hours as needed for indigestion or diarrhea or loose stools., Disp: , Rfl:    buPROPion  (WELLBUTRIN  XL) 300 MG 24 hr tablet, TAKE 1 TABLET BY MOUTH EVERY DAY, Disp: 30 tablet, Rfl: 3   candesartan  (ATACAND ) 32 MG tablet, Take 1 tablet (32 mg total) by mouth daily., Disp: 90 tablet, Rfl: 0   Cholecalciferol (VITAMIN D ) 1000 UNITS capsule, Take 1,000 Units by mouth daily., Disp: , Rfl:    cyclobenzaprine  (FLEXERIL ) 5 MG tablet, Take 1 tablet (5 mg total) by mouth at bedtime., Disp: 3 tablet, Rfl: 0   Evolocumab  (REPATHA  SURECLICK) 140 MG/ML SOAJ, INJECT 140 MG INTO THE SKIN EVERY 14 (FOURTEEN) DAYS., Disp: 2 mL, Rfl: 2   famotidine  (PEPCID ) 20 MG tablet, Take 1 tablet (20 mg  total) by mouth as needed for heartburn or indigestion (if protonix  doesn't work)., Disp: , Rfl:    inFLIXimab -abda (RENFLEXIS ) 100 MG SOLR, Inject 100 mg into the vein every 8 (eight) weeks. Remicaid infusion every 8 weeks for RA, Disp: , Rfl:    MEDROL  4 MG tablet, Take 4 mg by mouth daily., Disp: , Rfl:    methocarbamol  (ROBAXIN ) 500 MG tablet, Take 1 tablet (500 mg  total) by mouth every 6 (six) hours as needed for muscle spasms., Disp: 30 tablet, Rfl: 0   methotrexate  50 MG/2ML injection, Inject 25 mg into the skin once a week. On Saturdays, Disp: , Rfl:    methylPREDNISolone  (MEDROL ) 4 MG tablet, Standard 6 day taper dose pack, Disp: 21 tablet, Rfl: 0   ondansetron  (ZOFRAN -ODT) 4 MG disintegrating tablet, Take 4 mg by mouth every 8 (eight) hours as needed for vomiting or nausea., Disp: , Rfl:    oxyCODONE  (ROXICODONE ) 5 MG immediate release tablet, Take 1 tablet (5 mg total) by mouth every 6 (six) hours as needed for moderate pain (pain score 4-6) or severe pain (pain score 7-10)., Disp: 20 tablet, Rfl: 0   pantoprazole  (PROTONIX ) 40 MG tablet, TAKE 1 TABLET BY MOUTH EVERY DAY, Disp: 30 tablet, Rfl: 29   solifenacin  (VESICARE ) 10 MG tablet, Take 1 tablet (10 mg total) by mouth as needed., Disp: , Rfl:    tobramycin-dexamethasone  (TOBRADEX) ophthalmic ointment, Place 1 Application into the left eye 3 (three) times daily., Disp: , Rfl:    Objective:     Vitals:   07/27/24 1037  BP: 120/72  Pulse: 68  SpO2: 95%  Height: 5' 8 (1.727 m)      Body mass index is 32.84 kg/m.    Physical Exam:    Gen: Appears well, nad, nontoxic and pleasant Neuro:sensation intact, strength is 5/5 with df/pf/inv/ev, muscle tone wnl Skin: no suspicious lesion or defmority Psych: A&O, appropriate mood and affect  Right shoulder:  No deformity, swelling or muscle wasting No scapular winging FF 80, abd 60, int 10, ext 80 TTP deltoid NTTP over the [place], clavicle, ac, coracoid, biceps groove, humerus,  , trapezius, cervical spine Neg ant drawer, sulcus sign, apprehension Negative Spurling's test bilat FROM of neck    Electronically signed by:  Odis Mace D.[facility] 10:49 AM 07/27/24

## 2024-08-02 ENCOUNTER — Ambulatory Visit: Admitting: Sports Medicine

## 2024-08-09 ENCOUNTER — Ambulatory Visit: Payer: Self-pay | Admitting: Sports Medicine

## 2024-08-09 ENCOUNTER — Other Ambulatory Visit: Payer: Self-pay | Admitting: Medical

## 2024-08-17 ENCOUNTER — Ambulatory Visit: Admitting: Sports Medicine

## 2024-08-17 ENCOUNTER — Ambulatory Visit (HOSPITAL_BASED_OUTPATIENT_CLINIC_OR_DEPARTMENT_OTHER)
Admission: RE | Admit: 2024-08-17 | Discharge: 2024-08-17 | Disposition: A | Discharge status: Home / Self Care | Source: Ambulatory Visit

## 2024-08-17 DIAGNOSIS — R918 Other nonspecific abnormal finding of lung field: Secondary | ICD-10-CM | POA: Diagnosis not present

## 2024-08-17 DIAGNOSIS — J189 Pneumonia, unspecified organism: Secondary | ICD-10-CM | POA: Insufficient documentation

## 2024-08-17 DIAGNOSIS — I700 Atherosclerosis of aorta: Secondary | ICD-10-CM | POA: Diagnosis not present

## 2024-08-22 DIAGNOSIS — M0579 Rheumatoid arthritis with rheumatoid factor of multiple sites without organ or systems involvement: Secondary | ICD-10-CM | POA: Diagnosis not present

## 2024-08-23 ENCOUNTER — Encounter (HOSPITAL_BASED_OUTPATIENT_CLINIC_OR_DEPARTMENT_OTHER): Payer: Self-pay

## 2024-08-23 NOTE — Telephone Encounter (Signed)
**Note De-identified  [place] Obfuscation** Please advise 

## 2024-08-24 ENCOUNTER — Ambulatory Visit (HOSPITAL_BASED_OUTPATIENT_CLINIC_OR_DEPARTMENT_OTHER)

## 2024-08-29 ENCOUNTER — Ambulatory Visit (HOSPITAL_BASED_OUTPATIENT_CLINIC_OR_DEPARTMENT_OTHER)

## 2024-08-29 ENCOUNTER — Encounter (HOSPITAL_BASED_OUTPATIENT_CLINIC_OR_DEPARTMENT_OTHER): Payer: Self-pay

## 2024-08-29 VITALS — BP 126/80 | HR 86 | Ht 68.0 in | Wt 208.0 lb

## 2024-08-29 DIAGNOSIS — R911 Solitary pulmonary nodule: Secondary | ICD-10-CM | POA: Diagnosis not present

## 2024-08-29 NOTE — Patient Instructions (Signed)
 Complete PET scan as ordered.    Follow up in 4-6 weeks for review of results.  Return sooner if new or worsening symptoms.

## 2024-08-29 NOTE — Progress Notes (Signed)
 @Patient  ID: Judy [name], female    DOB: 17-May-1951, 73 y.o.   MRN: [id number]  Chief Complaint  Patient presents with   Follow-up    Referring provider: Dorina Loving, PA-C  HPI: Judy [name] is a 73 y/o female with PMH of OSA, obesity, anemia, GERD, and hypertension who presents for follow up of repeat CT.  She was recently hospitalized for CAP and atypical chest pain from 06/26/2024 through 06/29/2024 and was treated with IV antibiotics. Chest CT completed as an inpatient revealed new focal areas of consolidation in both the left lingula and RLL with associated adjacent pleural thickening; these were new findings in comparison with Chest CT completed in May 2025. She was last seen in our office on 07/13/2024 for post hospitalization follow up.  She remained symptomatic therefore was treated with another full course of antibiotics and reimaging of the lungs was ordered to determine resolution.  Today she reports that she feels better but is still not back at her baseline.  She continues to have night sweats and weight loss.  She denies any fever or chills but does report a decreased appetite.  She denies cough, chest pain, headache, dizziness, abdominal pain or other complaints.  Today we reviewed her chest CT completed on 08/17/2024.  This reveals interval resolution of the previously noted subpleural consolidative thickening of the medial right lower lobe.  There is a 8 mm nodule in the superior segment of the right lower lobe that remains.  She continues to have fatigue and decreased exercise tolerance.  TEST/EVENTS : Chest CT Super D 08/17/2024:  Mediastinum/Nodes: No hilar or mediastinal adenopathy. The esophagus is grossly unremarkable. No mediastinal fluid collection.   Lungs/Pleura: Interval resolution of the subpleural consolidation/thickening of the medial right lower lobe seen on the prior CT. There is an 8 mm nodule in the superior segment of the right lower lobe. No focal  consolidation, pleural effusion, or pneumothorax. The central airways are patent.   Upper Abdomen: No acute abnormality.   Musculoskeletal: No acute osseous pathology.   IMPRESSION: 1. Interval resolution of the subpleural consolidation/thickening of the medial right lower lobe seen on the prior CT. 2. An 8 mm nodule in the superior segment of the right lower lobe. Non-contrast chest CT at 6-12 months is recommended. If the nodule is stable at time of repeat CT, then future CT at 18-24 months (from today's scan) is considered optional for low-risk patients, but is recommended for high-risk patients. This recommendation follows the consensus statement: Guidelines for Management of Incidental Pulmonary Nodules Detected on CT Images: From the Fleischner Society 2017; Radiology 2017; 284:228-243. 3.  Aortic Atherosclerosis (ICD10-I70.0).  Allergies  Allergen Reactions   Metrizamide Shortness Of Breath and Swelling    Swelling mouth   Atorvastatin Other (See Comments)    Muscle aches requiring increased use of pain medication Muscle aches requiring increased use of pain medication    Immunization History  Administered Date(s) Administered   PFIZER(Purple Top)SARS-COV-2 Vaccination 01/17/2020, 02/14/2020, 08/14/2020   Pfizer Covid-19 Vaccine Bivalent Booster 50yrs & up 09/30/2021, 04/21/2022    Past Medical History:  Diagnosis Date   Abnormal CT of spine 03/28/2013   ?? 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 Overview:  ?? 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 Last Assessment & Plan:  Well controlled, no changes to meds. Encouraged heart healthy diet such as the DASH diet and exercise as tolerated.  Overview:  Last Assessment & Plan:  Well controlled.  Continue current medications and  low sodium Dash type diet. Formatting of this note might be different from the original. Last Assessment & Pl   BPPV (benign paroxysmal positional vertigo), left 03/06/2021   Calcific tendinitis of right shoulder 01/31/2021   Cataract 2020   bilateral   Cervical radiculopathy 08/13/2021   Depression    Dysplasia of cervix    Elevated erythrocyte sedimentation rate 09/25/2021   Essential hypertension 09/18/2020   Family history of breast cancer in sister 01/18/2013   CA in paternal half sister x 2 and maternal GGM  Formatting of this note might be different from the original. Overview:  CA in paternal half sister x 2 and maternal GGM   Fatigue 09/25/2021   Gastric polyp 01/18/2013   Dr Zulema.  Advised repeat 09/2012    Gastroesophageal reflux disease 06/14/2012   GERD (gastroesophageal reflux disease)    Hearing loss 09/25/2021   Hip impingement syndrome, left 09/19/2021   History of blood transfusion    History of cervical dysplasia 06/15/2012   History of gastric polyp 06/25/2013   History of shingles 02/14/2016   Hyperlipidemia    Hypertension    Insomnia 05/18/2013   Intervertebral disc protrusion 08/22/2013   See MRI  03/2013    Left rotator cuff tear 08/17/2014   See MRI 2015    Leg swelling 08/03/2013   Venous dopplers neg 08/04/13     Lumbar spondylosis 07/03/2013   Meniere disease, 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 Last Assessment & Plan:  On chronic Celebrex , refill provided   Other and unspecified hyperlipidemia 06/14/2012   Other long term (current) drug therapy 09/25/2021   Other specified abnormal immunological findings in serum 09/25/2021   Pain in limb 09/25/2021   Personal history of colonic polyps  01/18/2013   Personal history of renal calculi 02/23/2013   nonobstructing stone L kidney   Formatting of this note might be different from the original. 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 Last Assessment & Plan:  Improvement with the glenohumeral injection. -Counseled on home exercise therapy and supportive care. -Could consider physical therapy.   Pulmonary embolism (HCC) 08/1975   Pulmonary hypertension (HCC) 08/03/2013   H/o PE VQ, duplex neg 08/2013 - Echo 08/15/2013 >>PA peak pressure: 37mm Hg   Formatting of this note might be different from the original. Overview:  H/o PE VQ, duplex neg 08/2013 - Echo 08/15/2013 >>PA peak pressure: 37mm Hg  Last Assessment & Plan:  Rpt echo in 1 yr   Rheumatoid arthritis (HCC) 04/09/2020   Right wrist pain 02/22/2020   S/P hysterectomy 06/14/2012   Pap 10/2011 negative  Formatting of this note might be different from the original. 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  On Prozac in past  Formatting of this  note might be different from the original. Overview:  Per pt.  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     buPROPion  (WELLBUTRIN  XL) 300 MG 24 hr tablet TAKE 1 TABLET BY MOUTH EVERY DAY 30 tablet 3   candesartan  (ATACAND ) 32 MG tablet Take 1 tablet (32 mg total) by mouth daily. 90 tablet 0   Cholecalciferol (VITAMIN D ) 1000 UNITS capsule Take 1,000 Units by mouth daily.     cyclobenzaprine  (FLEXERIL ) 5 MG tablet Take 1 tablet (5 mg total) by mouth at bedtime. 3 tablet 0   Evolocumab  (REPATHA  SURECLICK) 140 MG/ML SOAJ INJECT 140 MG INTO THE SKIN EVERY 14 (FOURTEEN) DAYS. 2 mL 2   famotidine  (PEPCID ) 20 MG tablet Take 1 tablet (20 mg total) by mouth as needed for heartburn or indigestion (if protonix  doesn't work).     inFLIXimab -abda (RENFLEXIS ) 100 MG SOLR Inject 100 mg into the vein every 8 (eight) weeks. Remicaid infusion every 8 weeks for RA     methocarbamol  (ROBAXIN ) 500 MG tablet Take 1 tablet (500 mg total) by mouth every 6 (six) hours as needed for muscle spasms. 30 tablet 0   methotrexate  50 MG/2ML injection Inject 25 mg into the skin once a week. On Saturdays     methylPREDNISolone  (MEDROL ) 4 MG tablet Standard 6 day taper dose pack 21 tablet 0   ondansetron  (ZOFRAN -ODT) 4 MG disintegrating tablet Take 4 mg by mouth every 8 (eight) hours as needed for vomiting or nausea.     oxyCODONE  (ROXICODONE ) 5 MG immediate release tablet Take 1 tablet (5 mg total) by mouth every 6 (six) hours as needed for moderate pain (pain score 4-6) or severe pain (pain score 7-10). 20 tablet 0   pantoprazole  (PROTONIX ) 40 MG tablet TAKE 1 TABLET BY MOUTH EVERY DAY 30 tablet 29   solifenacin   (VESICARE ) 10 MG tablet Take 1 tablet (10 mg total) by mouth as needed.     tobramycin-dexamethasone  (TOBRADEX) ophthalmic ointment Place 1 Application into the left eye 3 (three) times daily.     MEDROL  4 MG tablet Take 4 mg by mouth daily.     No facility-administered medications prior to visit.     Review of Systems: As per HPI  Constitutional:   No  weight loss, night sweats,  Fevers, chills, fatigue, or  lassitude.  HEENT:   No headaches,  Difficulty swallowing,  Tooth/dental problems, or  Sore  throat,                No sneezing, itching, ear ache, nasal congestion, post nasal drip,   CV:  No chest pain,  Orthopnea, PND, swelling in lower extremities, anasarca, dizziness, palpitations, syncope.   GI  No heartburn, indigestion, abdominal pain, nausea, vomiting, diarrhea, change in bowel habits, loss of appetite, bloody stools.   Resp: No shortness of breath with exertion or at rest.  No excess mucus, no productive cough,  No non-productive cough,  No coughing up of blood.  No change in color of mucus.  No wheezing.  No chest wall deformity  Skin: no rash or lesions.  GU: no dysuria, change in color of urine, no urgency or frequency.  No flank pain, no hematuria   MS:  No joint pain or swelling.  No decreased range of motion.  No back pain.    Physical Exam  BP 126/80   Pulse 86   Ht 5' 8 (1.727 m)   Wt 208 lb (94.3 kg)   SpO2 97%   BMI 31.63 kg/m   GEN: A/Ox3; pleasant , NAD, well nourished    HEENT:  [place]/AT,  EACs-clear, TMs-wnl, NOSE-clear, THROAT-clear, no lesions, no postnasal drip or exudate noted.   NECK:  Supple w/ fair ROM; no JVD; normal carotid impulses w/o bruits; no thyromegaly or nodules palpated; no lymphadenopathy.    RESP  Clear  P & A; w/o, wheezes/ rales/ or rhonchi. no accessory muscle use, no dullness to percussion  CARD:  RRR, no m/r/g, no peripheral edema, pulses intact, no cyanosis or clubbing.  GI:   Soft & nt; nml bowel sounds; no  organomegaly or masses detected.   Musco: Warm bil, no deformities or joint swelling noted.   Neuro: alert, no focal deficits noted.    Skin: Warm, no lesions or rashes    Lab Results:  CBC    Component Value Date/Time   WBC 8.0 07/21/2024 0924   RBC 4.38 07/21/2024 0924   HGB 12.2 07/21/2024 0924   HGB 12.2 01/21/2021 0820   HCT 37.7 07/21/2024 0924   HCT 38.1 01/21/2021 0820   PLT 354.0 07/21/2024 0924   PLT 363 01/21/2021 0820   MCV 86.2 07/21/2024 0924   MCV 87 01/21/2021 0820   MCH 28.0 06/28/2024 0505   MCHC 32.3 07/21/2024 0924   RDW 15.3 07/21/2024 0924   RDW 14.5 01/21/2021 0820   LYMPHSABS 3.3 07/21/2024 0924   LYMPHSABS 3.2 (H) 01/21/2021 0820   MONOABS 0.7 07/21/2024 0924   EOSABS 0.2 07/21/2024 0924   EOSABS 0.1 01/21/2021 0820   BASOSABS 0.0 07/21/2024 0924   BASOSABS 0.0 01/21/2021 0820    BMET    Component Value Date/Time   NA 136 07/21/2024 0924   NA 140 01/13/2024 1103   K 4.1 07/21/2024 0924   CL 100 07/21/2024 0924   CO2 29 07/21/2024 0924   GLUCOSE 91 07/21/2024 0924   BUN 14 07/21/2024 0924   BUN 15 01/13/2024 1103   CREATININE 1.33 (H) 07/21/2024 0924   CALCIUM  9.5 07/21/2024 0924   GFRNONAA 54 (L) 06/28/2024 0505   GFRAA 78 01/21/2021 0820    BNP No results found for: BNP  ProBNP    Component Value Date/Time   PROBNP 38.0 03/30/2024 1505    Imaging: CT SUPER D CHEST WO CONTRAST Result Date: 08/24/2024 CLINICAL DATA:  Pneumonia. EXAM: CT CHEST WITHOUT CONTRAST TECHNIQUE: Multidetector CT imaging of the chest was performed using thin  slice collimation for electromagnetic bronchoscopy planning purposes, without intravenous contrast. RADIATION DOSE REDUCTION: This exam was performed according to the departmental dose-optimization program which includes automated exposure control, adjustment of the mA and/or kV according to patient size and/or use of iterative reconstruction technique. COMPARISON:  Chest CT dated 06/26/2024.  FINDINGS: Evaluation of this exam is limited in the absence of intravenous contrast. Cardiovascular: There is no cardiomegaly or pericardial effusion. Mild atherosclerotic calcification of the thoracic aorta. No aneurysmal dilatation. The central pulmonary arteries are grossly unremarkable. Mediastinum/Nodes: No hilar or mediastinal adenopathy. The esophagus is grossly unremarkable. No mediastinal fluid collection. Lungs/Pleura: Interval resolution of the subpleural consolidation/thickening of the medial right lower lobe seen on the prior CT. There is an 8 mm nodule in the superior segment of the right lower lobe. No focal consolidation, pleural effusion, or pneumothorax. The central airways are patent. Upper Abdomen: No acute abnormality. Musculoskeletal: No acute osseous pathology. IMPRESSION: 1. Interval resolution of the subpleural consolidation/thickening of the medial right lower lobe seen on the prior CT. 2. An 8 mm nodule in the superior segment of the right lower lobe. Non-contrast chest CT at 6-12 months is recommended. If the nodule is stable at time of repeat CT, then future CT at 18-24 months (from today's scan) is considered optional for low-risk patients, but is recommended for high-risk patients. This recommendation follows the consensus statement: Guidelines for Management of Incidental Pulmonary Nodules Detected on CT Images: From the Fleischner Society 2017; Radiology 2017; 284:228-243. 3.  Aortic Atherosclerosis (ICD10-I70.0). Electronically Signed   By: Vanetta Chou M.D.   On: 08/24/2024 22:28    Administration History     None           No data to display          No results found for: NITRICOXIDE   Assessment & Plan:   Assessment & Plan Lung nodule Based on the patient's continued symptoms of weight loss, night sweats, and decreased appetite in the setting of a family history of lung cancer in a first-degree relative and her persistent nodule of 8 mm would proceed  with a PET scan to further evaluate. Differential for the origin of the nodule includes lung cancer versus rheumatoid arthritis versus granulomatous disease versus resolving infection. Plan for follow-up of PET results after it is completed.   Return in about 5 weeks (around 10/03/2024) for discuss PET results.  Candis Dandy, PA-C 08/29/2024

## 2024-08-30 ENCOUNTER — Ambulatory Visit: Admitting: Cardiology

## 2024-09-02 ENCOUNTER — Encounter (HOSPITAL_COMMUNITY)

## 2024-09-05 ENCOUNTER — Encounter (HOSPITAL_COMMUNITY)

## 2024-09-07 ENCOUNTER — Other Ambulatory Visit: Payer: Self-pay | Admitting: Medical

## 2024-09-11 ENCOUNTER — Other Ambulatory Visit: Payer: Self-pay | Admitting: Gastroenterology

## 2024-09-12 ENCOUNTER — Encounter (HOSPITAL_COMMUNITY)
Admission: RE | Admit: 2024-09-12 | Discharge: 2024-09-12 | Disposition: A | Discharge status: Home / Self Care | Source: Ambulatory Visit

## 2024-09-12 DIAGNOSIS — R911 Solitary pulmonary nodule: Secondary | ICD-10-CM | POA: Insufficient documentation

## 2024-09-12 LAB — GLUCOSE, CAPILLARY: Glucose-Capillary: 95 mg/dL (ref 70–99)

## 2024-09-12 MED ORDER — FLUDEOXYGLUCOSE F - 18 (FDG) INJECTION
10.4000 | Freq: Once | INTRAVENOUS | Status: AC | PRN
Start: 2024-09-12 — End: 2024-09-12
  Administered 2024-09-12: 10.4 via INTRAVENOUS

## 2024-09-13 ENCOUNTER — Other Ambulatory Visit: Payer: Self-pay | Admitting: "Endocrinology

## 2024-09-14 ENCOUNTER — Encounter (HOSPITAL_BASED_OUTPATIENT_CLINIC_OR_DEPARTMENT_OTHER): Payer: Self-pay

## 2024-09-14 NOTE — Telephone Encounter (Signed)
**Note De-identified  [place] Obfuscation** Please advise 

## 2024-09-19 ENCOUNTER — Ambulatory Visit (HOSPITAL_BASED_OUTPATIENT_CLINIC_OR_DEPARTMENT_OTHER)

## 2024-09-20 ENCOUNTER — Ambulatory Visit (INDEPENDENT_AMBULATORY_CARE_PROVIDER_SITE_OTHER): Admitting: Podiatry

## 2024-09-20 ENCOUNTER — Other Ambulatory Visit: Payer: Self-pay

## 2024-09-20 ENCOUNTER — Ambulatory Visit

## 2024-09-20 VITALS — Ht 68.0 in | Wt 208.0 lb

## 2024-09-20 DIAGNOSIS — Q72892 Other reduction defects of left lower limb: Secondary | ICD-10-CM | POA: Diagnosis not present

## 2024-09-20 DIAGNOSIS — D571 Sickle-cell disease without crisis: Secondary | ICD-10-CM | POA: Insufficient documentation

## 2024-09-20 DIAGNOSIS — E669 Obesity, unspecified: Secondary | ICD-10-CM | POA: Insufficient documentation

## 2024-09-20 DIAGNOSIS — M19271 Secondary osteoarthritis, right ankle and foot: Secondary | ICD-10-CM

## 2024-09-20 DIAGNOSIS — E785 Hyperlipidemia, unspecified: Secondary | ICD-10-CM | POA: Insufficient documentation

## 2024-09-20 DIAGNOSIS — T7840XA Allergy, unspecified, initial encounter: Secondary | ICD-10-CM | POA: Insufficient documentation

## 2024-09-20 NOTE — Progress Notes (Signed)
 Subjective:  Patient ID: Judy [name], female    DOB: 07-16-1951,  MRN: [id number]  Chief Complaint  Patient presents with   Foot Pain    Rm 1 Patient is here for left foot pain and right foot instability.    Discussed the use of AI scribe software for clinical note transcription with the patient, who gave verbal consent to proceed.  History of Present Illness [name] is a 73 year old female with rheumatoid arthritis who presents with bilateral foot pain and deformities.  She has chronic left foot pain since birth, described as sharp and localized under the fourth metatarsal head, exacerbated by walking and wearing thin shoes. Molded insoles provide some relief, but pain becomes severe after prolonged activity. Broad and diffuse calluses have developed under the fifth metatarsal head, and regular lotion is used, though no specific creams have been tried.  A digital deformity affects the second and third toes of the left foot, leading to shoe-related callus formation on the side of the foot, raising concerns about further rubbing and discomfort.  On the right foot, she experiences pain and instability, particularly after sitting for long periods or when first getting up. The pain is located at the base of the fifth metatarsal and the fourth TMT joint, described as feeling unstable and painful when walking down steps. The pain is more pronounced in the back of the foot. She has a known diagnosis of rheumatoid arthritis.  She has been using orthotics since the COVID pandemic, initially for plantar fasciitis, and is unsure if they are still effective for her current foot issues. Her insurance covered the cost of the orthotics, which were designed for high heels and do not have a back.  She is trying to maintain her mobility and stay active at 73 years old. No ankle giving out or rolling.      Objective:    Physical Exam VASCULAR: DP and PT pulse palpable. Foot is warm and  well-perfused. Capillary fill time is brisk. DERMATOLOGIC: Normal skin turgor, texture, and temperature. No open lesions, rashes, or ulcerations. NEUROLOGIC: Normal sensation to light touch and pressure. No paresthesias. ORTHOPEDIC: Sharp pain at plantar right midfoot and left midfoot at residual fourth metatarsal head. Callus at fifth metatarsal head. Digital deformity of second and third toe, hallux lumbar. Pain at base of right fifth metatarsal base and fourth TMT. No ecchymosis or bruising. No gross deformity.   No images are attached to the encounter.    Results RADIOLOGY Foot X-ray: Brachymetatarsia of the left fourth metatarsal, third digital deformity, enthesopathy of the right fifth metatarsal base.   Assessment:   1. Brachymetatarsia of left foot   2. Other secondary osteoarthritis of right foot      Plan:  Patient was evaluated and treated and all questions answered.  Assessment and Plan Assessment & Plan Chronic left foot pain due to brachymetatarsia with digital deformity Chronic left foot pain due to brachymetatarsia of the fourth metatarsal with associated digital deformity, exacerbated by pressure on the metatarsal head. - Evaluate current insoles for potential adjustment or refurbishment to alleviate pressure.  Digital deformity of left second and third toes Digital deformity of the left second and third toes causing discomfort. - Consider using a toe regulator to splint the second and third toes together.  Callus formation of left fifth metatarsal head Diffuse callus formation under the left fifth metatarsal head causing discomfort. - Recommend over-the-counter 40% urea cream to soften the callus.  Right foot  pain with arthritis and enthesopathy Intermittent right foot pain associated with arthritis and enthesopathy at the fifth metatarsal base, with bone spurring and calcification of the tendon. Pain is most pronounced upon initial movement after rest. - Use  Voltaren gel several times a day for arthritis pain.      Return if symptoms worsen or fail to improve.

## 2024-09-22 ENCOUNTER — Ambulatory Visit

## 2024-09-22 VITALS — BP 136/88 | HR 63 | Ht 68.0 in | Wt 203.8 lb

## 2024-09-22 DIAGNOSIS — I10 Essential (primary) hypertension: Secondary | ICD-10-CM

## 2024-09-22 DIAGNOSIS — R0789 Other chest pain: Secondary | ICD-10-CM

## 2024-09-22 DIAGNOSIS — E782 Mixed hyperlipidemia: Secondary | ICD-10-CM

## 2024-09-22 DIAGNOSIS — R072 Precordial pain: Secondary | ICD-10-CM | POA: Diagnosis not present

## 2024-09-22 MED ORDER — METOPROLOL TARTRATE 50 MG PO TABS: 50.0000 mg | ORAL_TABLET | Freq: Once | ORAL | 0 refills | Status: DC

## 2024-09-22 MED ORDER — ASPIRIN 81 MG PO TBEC: 81.0000 mg | DELAYED_RELEASE_TABLET | Freq: Every day | ORAL | 3 refills | Status: AC

## 2024-09-22 NOTE — Assessment & Plan Note (Signed)
 Lipid Panel     Component Value Date/Time   CHOL 174 05/17/2024 1406   TRIG 203 (H) 05/17/2024 1406   HDL 59 05/17/2024 1406   CHOLHDL 2.9 05/17/2024 1406   CHOLHDL 3 03/30/2024 1505   VLDL 23.6 03/30/2024 1505   LDLCALC 81 05/17/2024 1406   LABVLDL 34 05/17/2024 1406   Remains on evolocumab .

## 2024-09-22 NOTE — Assessment & Plan Note (Signed)
 Suboptimal today. Advised to monitor at home. Continue candesartan  at current dose. Further consider adding amlodipine if blood pressures are uncontrolled.
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
## 2024-09-22 NOTE — Patient Instructions (Signed)
 Medication Instructions:  Your physician has recommended you make the following change in your medication:   START: Aspirin  81 mg daily  *If you need a refill on your cardiac medications before your next appointment, please call your pharmacy*  Lab Work: Your physician recommends that you return for lab work in:   Labs today: BMP  If you have labs (blood work) drawn today and your tests are completely normal, you will receive your results only by: MyChart Message (if you have MyChart) OR A paper copy in the mail If you have any lab test that is abnormal or we need to change your treatment, we will call you to review the results.  Testing/Procedures:   Your cardiac CT will be scheduled at one of the below locations:   [facility] [address] [place], [place] [address] [phone] (Severe contrast allergies only)  OR   [facility] [address] [place], [place] [address] [phone]  OR   [facility] [address] [place], [place] [address] [phone]  OR   [facility] [address]  [place], [place] [address] [phone]  OR   [facility] [address] [place], [place] [phone]  If scheduled at [facility], please arrive at the [facility]'s Entrance (Entrance C2) of [facility] 30 minutes prior to test start time. You can use the FREE valet parking offered at entrance C (encouraged to control the heart rate for the test)  Proceed to the [facility] Radiology Department (first floor) to check-in and test prep.  All radiology patients and guests should use entrance C2 at [facility], accessed from [facility], even though the hospital's physical address listed is [address]  If scheduled at the Heart and Vascular Tower at [address], please enter the parking lot using the [address]  entrance and use the FREE valet service at the patient drop-off area. Enter the building and check-in with registration on the main floor.  If scheduled at [facility], please arrive to the Heart and Vascular Center 15 mins early for check-in and test prep.  There is spacious parking and easy access to the radiology department from the [facility] entrance. Please enter here and check-in with the desk attendant.   If scheduled at [facility], please arrive 30 minutes early for check-in and test prep.  Please follow these instructions carefully (unless otherwise directed):  An IV will be required for this test and Nitroglycerin  will be given.  Hold all erectile dysfunction medications at least 3 days (72 hrs) prior to test. (Ie viagra, cialis, sildenafil, tadalafil, etc)   On the Night Before the Test: Be sure to Drink plenty of water . Do not consume any caffeinated/decaffeinated beverages or chocolate 12 hours prior to your test. Do not take any antihistamines 12 hours prior to your test.  On the Day of the Test: Drink plenty of water  until 1 hour prior to the test. Do not eat any food 1 hour prior to test. You may take your regular medications prior to the test.  Take metoprolol (Lopressor) two hours prior to test. Patients who wear a continuous glucose monitor MUST remove the device prior to scanning. FEMALES- please wear underwire-free bra if available, avoid dresses & tight clothing      After the Test: Drink plenty of water . After receiving IV contrast,  you may experience a mild flushed feeling. This is normal. On occasion, you may experience a mild rash up to 24 hours after the test. This is not dangerous. If this occurs, you can take Benadryl  25 mg, Zyrtec, Claritin, or Allegra and increase your fluid intake. (Patients taking Tikosyn should avoid Benadryl , and may take Zyrtec, Claritin, or Allegra) If you experience trouble breathing,  this can be serious. If it is severe call 911 IMMEDIATELY. If it is mild, please call our office.  We will call to schedule your test 2-4 weeks out understanding that some insurance companies will need an authorization prior to the service being performed.   For more information and frequently asked questions, please visit our website : [email or web address]  For non-scheduling related questions, please contact the cardiac imaging nurse navigator should you have any questions/concerns: Cardiac Imaging Nurse Navigators Direct Office Dial: [phone]   For scheduling needs, including cancellations and rescheduling, please call [place], [phone].   Follow-Up: At [facility], you and your health needs are our priority.  As part of our continuing mission to provide you with exceptional heart care, our providers are all part of one team.  This team includes your primary Cardiologist (physician) and Advanced Practice Providers or APPs (Physician Assistants and Nurse Practitioners) who all work together to provide you with the care you need, when you need it.  Your next appointment:   3 month(s)  Provider:   Alean Kobus, MD    We recommend signing up for the patient portal called MyChart.  Sign up information is provided on this After Visit Summary.  MyChart is used to connect with patients for Virtual Visits (Telemedicine).  Patients are able to view lab/test results, encounter notes, upcoming appointments, etc.  Non-urgent messages can be sent to your provider as well.   To learn more about what you can do with MyChart, go to [email or web address].   Other Instructions None

## 2024-09-22 NOTE — Assessment & Plan Note (Signed)
 Atypical chest pain. Associated with fatigue and reduced functional capacity over the past 4 months preceding her pneumonia in July.  Symptoms have persisted despite recovery from pneumonia. Also reports occasional nocturnal symptoms with night sweats.  Differential diagnosis includes coronary artery disease versus noncardiac causes with her systemic inflammatory condition and medications.  From cardiac standpoint we will proceed with cardiac CT coronary angiogram to rule out any obstructive CAD.  Discussed about contrast use and potential effect on kidneys however her renal function appears to be stable and low risk for any permanent damage. Will require metoprolol tartrate  Advised her to start taking aspirin  81 mg once daily.

## 2024-09-23 LAB — BASIC METABOLIC PANEL WITH GFR
BUN/Creatinine Ratio: 14 (ref 12–28)
BUN: 14 mg/dL (ref 8–27)
CO2: 22 mmol/L (ref 20–29)
Calcium: 9.4 mg/dL (ref 8.7–10.3)
Chloride: 102 mmol/L (ref 96–106)
Creatinine, Ser: 1 mg/dL (ref 0.57–1.00)
Glucose: 77 mg/dL (ref 70–99)
Potassium: 4 mmol/L (ref 3.5–5.2)
Sodium: 140 mmol/L (ref 134–144)
eGFR: 60 mL/min/1.73 (ref >59)

## 2024-09-27 ENCOUNTER — Ambulatory Visit: Admitting: Podiatry

## 2024-09-29 ENCOUNTER — Encounter (HOSPITAL_COMMUNITY): Payer: Self-pay

## 2024-10-03 ENCOUNTER — Ambulatory Visit (HOSPITAL_BASED_OUTPATIENT_CLINIC_OR_DEPARTMENT_OTHER)

## 2024-10-04 ENCOUNTER — Ambulatory Visit (HOSPITAL_BASED_OUTPATIENT_CLINIC_OR_DEPARTMENT_OTHER)
Admission: RE | Admit: 2024-10-04 | Discharge: 2024-10-04 | Disposition: A | Discharge status: Home / Self Care | Source: Ambulatory Visit

## 2024-10-04 DIAGNOSIS — R072 Precordial pain: Secondary | ICD-10-CM | POA: Diagnosis present

## 2024-10-04 MED ORDER — IOHEXOL 350 MG/ML SOLN
100.0000 mL | Freq: Once | INTRAVENOUS | Status: AC | PRN
  Administered 2024-10-04: 95 mL via INTRAVENOUS

## 2024-10-04 MED ORDER — NITROGLYCERIN 0.4 MG SL SUBL
0.8000 mg | SUBLINGUAL_TABLET | Freq: Once | SUBLINGUAL | Status: AC
  Administered 2024-10-04: 0.8 mg via SUBLINGUAL

## 2024-10-04 NOTE — Progress Notes (Addendum)
 Pt reports she has had Omnipaque  IV contrast dye in the past for CT scans, as recently as July 2025, with no issue or complications.

## 2024-10-05 ENCOUNTER — Other Ambulatory Visit

## 2024-10-05 DIAGNOSIS — Z006 Encounter for examination for normal comparison and control in clinical research program: Secondary | ICD-10-CM

## 2024-10-07 DIAGNOSIS — Z6830 Body mass index (BMI) 30.0-30.9, adult: Secondary | ICD-10-CM | POA: Diagnosis not present

## 2024-10-07 DIAGNOSIS — M1991 Primary osteoarthritis, unspecified site: Secondary | ICD-10-CM | POA: Diagnosis not present

## 2024-10-07 DIAGNOSIS — H9191 Unspecified hearing loss, right ear: Secondary | ICD-10-CM | POA: Diagnosis not present

## 2024-10-07 DIAGNOSIS — M3501 Sicca syndrome with keratoconjunctivitis: Secondary | ICD-10-CM | POA: Diagnosis not present

## 2024-10-07 DIAGNOSIS — M5030 Other cervical disc degeneration, unspecified cervical region: Secondary | ICD-10-CM | POA: Diagnosis not present

## 2024-10-07 DIAGNOSIS — M0579 Rheumatoid arthritis with rheumatoid factor of multiple sites without organ or systems involvement: Secondary | ICD-10-CM | POA: Diagnosis not present

## 2024-10-07 DIAGNOSIS — Z79899 Other long term (current) drug therapy: Secondary | ICD-10-CM | POA: Diagnosis not present

## 2024-10-07 DIAGNOSIS — H209 Unspecified iridocyclitis: Secondary | ICD-10-CM | POA: Diagnosis not present

## 2024-10-07 DIAGNOSIS — R7689 Other specified abnormal immunological findings in serum: Secondary | ICD-10-CM | POA: Diagnosis not present

## 2024-10-07 DIAGNOSIS — M25551 Pain in right hip: Secondary | ICD-10-CM | POA: Diagnosis not present

## 2024-10-07 DIAGNOSIS — R7989 Other specified abnormal findings of blood chemistry: Secondary | ICD-10-CM | POA: Diagnosis not present

## 2024-10-07 DIAGNOSIS — E669 Obesity, unspecified: Secondary | ICD-10-CM | POA: Diagnosis not present

## 2024-10-11 ENCOUNTER — Ambulatory Visit: Admitting: Cardiology

## 2024-10-13 ENCOUNTER — Other Ambulatory Visit: Payer: Self-pay | Admitting: "Endocrinology

## 2024-10-14 ENCOUNTER — Encounter: Payer: Self-pay | Admitting: Medical

## 2024-10-14 ENCOUNTER — Ambulatory Visit (INDEPENDENT_AMBULATORY_CARE_PROVIDER_SITE_OTHER): Admitting: Medical

## 2024-10-14 VITALS — BP 122/74 | HR 76 | Ht 68.0 in | Wt 198.8 lb

## 2024-10-14 DIAGNOSIS — H60509 Unspecified acute noninfective otitis externa, unspecified ear: Secondary | ICD-10-CM | POA: Diagnosis not present

## 2024-10-14 DIAGNOSIS — H6991 Unspecified Eustachian tube disorder, right ear: Secondary | ICD-10-CM | POA: Diagnosis not present

## 2024-10-14 DIAGNOSIS — K219 Gastro-esophageal reflux disease without esophagitis: Secondary | ICD-10-CM | POA: Diagnosis not present

## 2024-10-14 LAB — GENECONNECT MOLECULAR SCREEN: Genetic Analysis Overall Interpretation: NEGATIVE

## 2024-10-14 MED ORDER — FLUTICASONE PROPIONATE 50 MCG/ACT NA SUSP: 2.0000 | Freq: Every day | NASAL | 1 refills | Status: DC

## 2024-10-14 MED ORDER — PANTOPRAZOLE SODIUM 40 MG PO TBEC: 40.0000 mg | DELAYED_RELEASE_TABLET | Freq: Every day | ORAL | 11 refills | Status: DC

## 2024-10-14 MED ORDER — NEOMYCIN-POLYMYXIN-HC 3.5-10000-1 OT SOLN: 3.0000 [drp] | Freq: Three times a day (TID) | OTIC | 0 refills | Status: AC
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
## 2024-10-14 NOTE — Patient Instructions (Addendum)
 Acute otitis externa and eustachian tube dysfunction, right ear Acute otitis externa with ear canal sensitivity and inflammation. Possible eustachian tube dysfunction indicated. Normal tm.  - Cortisporin otic drops, 3 drops in right ear, 3 times daily. - Flonase  nasal spray for eustachian tube dysfunction.  Gastroesophageal reflux disease (GERD) Chronic GERD managed with pantoprazole . Temporary lapse in medication due to pharmacy issues. No significant methotrexate  interaction noted.(Pt aware of benefits vs risk of meds)Has discussed with her rheumatologist. - Refilled pantoprazole  prescription.   Follow up 7-10 days if any current symptoms persist or worsen. Otherwise follow up early-mid spring routine follow up chronic med problems.

## 2024-10-17 ENCOUNTER — Ambulatory Visit: Admitting: Medical

## 2024-10-25 DIAGNOSIS — R5383 Other fatigue: Secondary | ICD-10-CM | POA: Diagnosis not present

## 2024-10-25 DIAGNOSIS — Z111 Encounter for screening for respiratory tuberculosis: Secondary | ICD-10-CM | POA: Diagnosis not present

## 2024-10-25 DIAGNOSIS — M0579 Rheumatoid arthritis with rheumatoid factor of multiple sites without organ or systems involvement: Secondary | ICD-10-CM | POA: Diagnosis not present

## 2024-11-09 ENCOUNTER — Other Ambulatory Visit: Payer: Self-pay

## 2024-11-09 ENCOUNTER — Telehealth: Payer: Self-pay

## 2024-11-09 DIAGNOSIS — E782 Mixed hyperlipidemia: Secondary | ICD-10-CM

## 2024-11-09 DIAGNOSIS — R7303 Prediabetes: Secondary | ICD-10-CM

## 2024-11-09 DIAGNOSIS — I10 Essential (primary) hypertension: Secondary | ICD-10-CM

## 2024-11-09 DIAGNOSIS — E6609 Other obesity due to excess calories: Secondary | ICD-10-CM

## 2024-11-09 NOTE — Telephone Encounter (Signed)
 Left a message requesting pt return call to the office.

## 2024-11-14 DIAGNOSIS — E6609 Other obesity due to excess calories: Secondary | ICD-10-CM | POA: Diagnosis not present

## 2024-11-14 DIAGNOSIS — E782 Mixed hyperlipidemia: Secondary | ICD-10-CM | POA: Diagnosis not present

## 2024-11-14 DIAGNOSIS — E66811 Obesity, class 1: Secondary | ICD-10-CM | POA: Diagnosis not present

## 2024-11-14 DIAGNOSIS — I10 Essential (primary) hypertension: Secondary | ICD-10-CM | POA: Diagnosis not present

## 2024-11-14 DIAGNOSIS — Z6832 Body mass index (BMI) 32.0-32.9, adult: Secondary | ICD-10-CM | POA: Diagnosis not present

## 2024-11-14 DIAGNOSIS — R7303 Prediabetes: Secondary | ICD-10-CM | POA: Diagnosis not present

## 2024-11-15 LAB — COMPREHENSIVE METABOLIC PANEL WITH GFR
ALT: 10 IU/L (ref 0–32)
AST: 18 IU/L (ref 0–40)
Albumin: 4.2 g/dL (ref 3.8–4.8)
Alkaline Phosphatase: 67 IU/L (ref 49–135)
BUN/Creatinine Ratio: 16 (ref 12–28)
BUN: 22 mg/dL (ref 8–27)
Bilirubin Total: 0.9 mg/dL (ref 0.0–1.2)
CO2: 25 mmol/L (ref 20–29)
Calcium: 9.7 mg/dL (ref 8.7–10.3)
Chloride: 103 mmol/L (ref 96–106)
Creatinine, Ser: 1.41 mg/dL — ABNORMAL HIGH (ref 0.57–1.00)
Globulin, Total: 2.5 g/dL (ref 1.5–4.5)
Glucose: 77 mg/dL (ref 70–99)
Potassium: 4.3 mmol/L (ref 3.5–5.2)
Sodium: 139 mmol/L (ref 134–144)
Total Protein: 6.7 g/dL (ref 6.0–8.5)
eGFR: 40 mL/min/1.73 — ABNORMAL LOW (ref >59)

## 2024-11-15 LAB — LIPID PANEL
Chol/HDL Ratio: 3 ratio (ref 0.0–4.4)
Cholesterol, Total: 187 mg/dL (ref 100–199)
HDL: 63 mg/dL (ref >39)
LDL Chol Calc (NIH): 105 mg/dL — ABNORMAL HIGH (ref 0–99)
Triglycerides: 108 mg/dL (ref 0–149)
VLDL Cholesterol Cal: 19 mg/dL (ref 5–40)

## 2024-11-21 ENCOUNTER — Ambulatory Visit: Admitting: "Endocrinology

## 2024-11-21 ENCOUNTER — Encounter: Payer: Self-pay | Admitting: "Endocrinology

## 2024-11-21 VITALS — BP 120/70 | Ht 68.0 in | Wt 194.0 lb

## 2024-11-21 DIAGNOSIS — I10 Essential (primary) hypertension: Secondary | ICD-10-CM

## 2024-11-21 DIAGNOSIS — E782 Mixed hyperlipidemia: Secondary | ICD-10-CM

## 2024-11-21 DIAGNOSIS — R7303 Prediabetes: Secondary | ICD-10-CM | POA: Diagnosis not present

## 2024-11-21 LAB — POCT GLYCOSYLATED HEMOGLOBIN (HGB A1C): Hemoglobin A1C: 5.6 % (ref 4.0–5.6)

## 2024-11-21 MED ORDER — REPATHA SURECLICK 140 MG/ML ~~LOC~~ SOAJ: 140.0000 mg | SUBCUTANEOUS | 1 refills | Status: AC
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
## 2024-11-24 ENCOUNTER — Ambulatory Visit (INDEPENDENT_AMBULATORY_CARE_PROVIDER_SITE_OTHER): Admitting: Sports Medicine

## 2024-11-24 VITALS — BP 122/72 | Ht 68.0 in | Wt 194.0 lb

## 2024-11-24 DIAGNOSIS — M25511 Pain in right shoulder: Secondary | ICD-10-CM | POA: Diagnosis not present

## 2024-11-24 DIAGNOSIS — G8929 Other chronic pain: Secondary | ICD-10-CM | POA: Diagnosis not present

## 2024-11-24 DIAGNOSIS — S46011D Strain of muscle(s) and tendon(s) of the rotator cuff of right shoulder, subsequent encounter: Secondary | ICD-10-CM | POA: Diagnosis not present

## 2024-11-24 NOTE — Patient Instructions (Signed)
 PT referral  6 week follow up

## 2024-11-28 ENCOUNTER — Ambulatory Visit: Admitting: Medical

## 2024-12-07 ENCOUNTER — Encounter: Payer: Self-pay | Admitting: Medical

## 2024-12-07 ENCOUNTER — Other Ambulatory Visit: Payer: Self-pay | Admitting: Medical

## 2024-12-07 ENCOUNTER — Ambulatory Visit (INDEPENDENT_AMBULATORY_CARE_PROVIDER_SITE_OTHER): Admitting: Medical

## 2024-12-07 VITALS — BP 120/80 | HR 100 | Temp 98.3°F | Resp 15 | Ht 68.0 in | Wt 189.8 lb

## 2024-12-07 DIAGNOSIS — J040 Acute laryngitis: Secondary | ICD-10-CM | POA: Diagnosis not present

## 2024-12-07 DIAGNOSIS — J020 Streptococcal pharyngitis: Secondary | ICD-10-CM

## 2024-12-07 DIAGNOSIS — K148 Other diseases of tongue: Secondary | ICD-10-CM | POA: Diagnosis not present

## 2024-12-07 DIAGNOSIS — R051 Acute cough: Secondary | ICD-10-CM

## 2024-12-07 DIAGNOSIS — R0981 Nasal congestion: Secondary | ICD-10-CM

## 2024-12-07 LAB — POCT INFLUENZA A/B
Influenza A, POC: NEGATIVE
Influenza B, POC: NEGATIVE

## 2024-12-07 LAB — POC COVID19 BINAXNOW: SARS Coronavirus 2 Ag: NEGATIVE

## 2024-12-07 LAB — POCT RAPID STREP A (OFFICE): Rapid Strep A Screen: POSITIVE — AB

## 2024-12-07 MED ORDER — BENZONATATE 100 MG PO CAPS: 100.0000 mg | ORAL_CAPSULE | Freq: Three times a day (TID) | ORAL | 0 refills | Status: DC | PRN

## 2024-12-07 MED ORDER — AMOXICILLIN 875 MG PO TABS: 875.0000 mg | ORAL_TABLET | Freq: Two times a day (BID) | ORAL | 0 refills | Status: AC

## 2024-12-07 NOTE — Patient Instructions (Signed)
 Streptococcal pharyngitis Positive strep test confirmed bacterial infection. Treated with antibiotics due to symptomatology. - Prescribed amoxicillin  875 mg twice daily for 10 days. Advised tablet splitting if swallowing difficulty.  Acute laryngitis Hoarseness likely due to overuse or infection. Reflux considered if symptoms persist post-antibiotics. - Monitor post-antibiotic symptoms. Consider reflux if hoarseness persists.  nasal congestion and cough -may have uri in addition to strep. -rx benzonatate   Tongue discoloration Yellowish-brown discoloration not consistent with thrush. Possible medication-induced dry mouth or benign causes. - Advised gentle tongue scraping and hydration. - Consider ENT referral if no improvement.  Follow up 10-14 days or sooner if needed
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
## 2024-12-12 ENCOUNTER — Ambulatory Visit: Admitting: Medical

## 2024-12-20 ENCOUNTER — Encounter: Payer: Self-pay | Admitting: Urology

## 2024-12-21 DIAGNOSIS — M5030 Other cervical disc degeneration, unspecified cervical region: Secondary | ICD-10-CM | POA: Insufficient documentation

## 2024-12-21 DIAGNOSIS — M79641 Pain in right hand: Secondary | ICD-10-CM | POA: Insufficient documentation

## 2024-12-21 DIAGNOSIS — H16229 Keratoconjunctivitis sicca, not specified as Sjogren's, unspecified eye: Secondary | ICD-10-CM | POA: Insufficient documentation

## 2024-12-21 DIAGNOSIS — R7689 Other specified abnormal immunological findings in serum: Secondary | ICD-10-CM | POA: Insufficient documentation

## 2024-12-22 ENCOUNTER — Ambulatory Visit: Admitting: Urology

## 2024-12-23 ENCOUNTER — Ambulatory Visit

## 2024-12-29 ENCOUNTER — Ambulatory Visit

## 2024-12-29 VITALS — BP 140/82 | HR 84 | Ht 68.0 in | Wt 189.4 lb

## 2024-12-29 DIAGNOSIS — I2510 Atherosclerotic heart disease of native coronary artery without angina pectoris: Secondary | ICD-10-CM | POA: Insufficient documentation

## 2024-12-29 DIAGNOSIS — E782 Mixed hyperlipidemia: Secondary | ICD-10-CM | POA: Diagnosis not present

## 2024-12-29 DIAGNOSIS — I10 Essential (primary) hypertension: Secondary | ICD-10-CM
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
## 2024-12-29 NOTE — Assessment & Plan Note (Signed)
 Well-controlled. Mentions readings at home typically systolic in 120s. Initial blood pressure reading here in the office 140/82 mmHg. Rechecked myself manually right upper arm blood pressure reading 130/80 mmHg. Continue current medication candesartan  32 mg once daily.

## 2024-12-29 NOTE — Patient Instructions (Signed)
 Medication Instructions:  Your physician recommends that you continue on your current medications as directed. Please refer to the Current Medication list given to you today.  *If you need a refill on your cardiac medications before your next appointment, please call your pharmacy*  Lab Work: None If you have labs (blood work) drawn today and your tests are completely normal, you will receive your results only by: MyChart Message (if you have MyChart) OR A paper copy in the mail If you have any lab test that is abnormal or we need to change your treatment, we will call you to review the results.  Testing/Procedures: None  Follow-Up: At [facility], you and your health needs are our priority.  As part of our continuing mission to provide you with exceptional heart care, our providers are all part of one team.  This team includes your primary Cardiologist (physician) and Advanced Practice Providers or APPs (Physician Assistants and Nurse Practitioners) who all work together to provide you with the care you need, when you need it.  Your next appointment:   Follow up as needed  Provider:   Huntley Dec, MD    We recommend signing up for the patient portal called "MyChart".  Sign up information is provided on this After Visit Summary.  MyChart is used to connect with patients for Virtual Visits (Telemedicine).  Patients are able to view lab/test results, encounter notes, upcoming appointments, etc.  Non-urgent messages can be sent to your provider as well.   To learn more about what you can do with MyChart, go to [email or web address].   Other Instructions None

## 2024-12-29 NOTE — Assessment & Plan Note (Signed)
 Did not tolerate statins in the past. Continue Repatha  140 mg subcutaneous injections once every 2 weeks. Follows up closely with her PCP.

## 2024-12-29 NOTE — Assessment & Plan Note (Signed)
 Calcium  score October 2021 0. Atypical symptoms of chest pain. Repeat cardiac CT 10/28/2025calcium score 0, CAD RADS 2 study mild nonobstructive disease.  Extracardiac findings noted stable 8 mm nodule in the superior segment of the right lower lobe and recommended follow-up in 6 to 12 months as per recommendations from prior PET/CT.   Improved symptoms since recovery from pneumonia. Remains asymptomatic. Continue aspirin  81 mg once daily Did not tolerate statins. Remains on Repatha  for lipid lowering.  Can hold aspirin  as needed for any significant increase in her heartburn or reflux symptoms.

## 2025-01-03 ENCOUNTER — Encounter: Payer: Self-pay | Admitting: Sports Medicine

## 2025-01-04 ENCOUNTER — Encounter: Payer: Self-pay | Admitting: Medical

## 2025-01-04 ENCOUNTER — Ambulatory Visit: Admitting: Medical

## 2025-01-04 VITALS — BP 120/80 | HR 101 | Temp 98.4°F | Resp 15 | Ht 68.0 in | Wt 187.6 lb

## 2025-01-04 DIAGNOSIS — K219 Gastro-esophageal reflux disease without esophagitis: Secondary | ICD-10-CM | POA: Diagnosis not present

## 2025-01-04 DIAGNOSIS — R1310 Dysphagia, unspecified: Secondary | ICD-10-CM | POA: Diagnosis not present

## 2025-01-04 DIAGNOSIS — E041 Nontoxic single thyroid nodule: Secondary | ICD-10-CM

## 2025-01-04 DIAGNOSIS — R051 Acute cough: Secondary | ICD-10-CM | POA: Diagnosis not present

## 2025-01-04 DIAGNOSIS — E010 Iodine-deficiency related diffuse (endemic) goiter: Secondary | ICD-10-CM

## 2025-01-04 DIAGNOSIS — E042 Nontoxic multinodular goiter: Secondary | ICD-10-CM

## 2025-01-04 DIAGNOSIS — R000 Tachycardia, unspecified: Secondary | ICD-10-CM

## 2025-01-04 NOTE — Patient Instructions (Addendum)
 Gastroesophageal reflux disease with dysphagia Chronic GERD with exacerbated dysphagia. - Referred to gastroenterologist for evaluation and possible endoscopy. - Continue Protonix  and famotidine .  Laryngitis history -some in past but largely resolved. If it does return and is constant then will refer to ENT.  Thyromegaly Slight thyroid  enlargement possibly contributing to dysphagia. - Ordered thyroid  ultrasound. - Ordered TSH and T4.  Tachycardia(entiire exam but then resolved by time did EKG) Heart rate 101 bpm, Previous EKG reported normal sinus rhythm. - Performed EKG to reassess heart rhythm.(ekg sinus rythm. Rate 85. No atrial fibrillation.) -check tsh and t4. Lungs clear.   Follow up date to be determined after lab review
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
## 2025-01-05 ENCOUNTER — Ambulatory Visit (HOSPITAL_BASED_OUTPATIENT_CLINIC_OR_DEPARTMENT_OTHER)
Admission: RE | Admit: 2025-01-05 | Discharge: 2025-01-05 | Disposition: A | Discharge status: Home / Self Care | Source: Ambulatory Visit | Attending: Medical | Admitting: Medical

## 2025-01-05 ENCOUNTER — Ambulatory Visit: Payer: Self-pay | Admitting: Medical

## 2025-01-05 ENCOUNTER — Ambulatory Visit: Admitting: Sports Medicine

## 2025-01-05 DIAGNOSIS — E010 Iodine-deficiency related diffuse (endemic) goiter: Secondary | ICD-10-CM | POA: Insufficient documentation

## 2025-01-05 LAB — TSH: TSH: 1.43 u[IU]/mL (ref 0.35–5.50)

## 2025-01-05 LAB — T4, FREE: Free T4: 0.71 ng/dL (ref 0.60–1.60)

## 2025-01-07 NOTE — Addendum Note (Signed)
 Addended by: DORINA DALLAS DORINA PA-C M on: 01/07/2025 08:41 AM   Modules accepted: Orders

## 2025-01-09 ENCOUNTER — Ambulatory Visit: Admitting: Urology

## 2025-01-09 ENCOUNTER — Encounter: Payer: Self-pay | Admitting: Medical

## 2025-01-09 ENCOUNTER — Telehealth: Payer: Self-pay

## 2025-01-09 MED ORDER — METHOTREXATE SODIUM CHEMO INJECTION 50 MG/2ML: 25.0000 mg | INTRAMUSCULAR | 1 refills | Status: AC

## 2025-01-09 NOTE — Telephone Encounter (Signed)
 Per Pt: Yes, states needing updated RX. Patient states rheumatologist office has been closed for a few days now, wanting to know if PCP would be willing to prescribe.

## 2025-01-09 NOTE — Telephone Encounter (Signed)
 Copied from CRM [id number]. Topic: Clinical - Medication Refill >> Jan 09, 2025  9:36 AM Sophia H wrote: Medication: methotrexate  50 MG/2ML injection  Has the patient contacted their pharmacy? Yes, states needing updated RX. Patient states rheumatologist office has been closed for a few days now, wanting to know if PCP would be willing to prescribe.   This is the patient's preferred pharmacy:  [id number] - [place], [place] - [address] [place] [address] Phone: [phone] Fax: [phone]   Is this the correct pharmacy for this prescription? Yes If no, delete pharmacy and type the correct one.   Has the prescription been filled recently? Yes  Is the patient out of the medication? Yes, has been out since last week.  Has the patient been seen for an appointment in the last year OR does the patient have an upcoming appointment? Yes, seen Jan 28.   Can we respond through MyChart? Yes  Agent: Please be advised that Rx refills may take up to 3 business days. We ask that you follow-up with your pharmacy.
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
## 2025-01-11 ENCOUNTER — Ambulatory Visit: Admitting: Sports Medicine

## 2025-01-11 VITALS — BP 122/82 | Ht 68.0 in | Wt 187.0 lb

## 2025-01-11 DIAGNOSIS — S46011D Strain of muscle(s) and tendon(s) of the rotator cuff of right shoulder, subsequent encounter: Secondary | ICD-10-CM | POA: Diagnosis not present

## 2025-01-11 DIAGNOSIS — M25511 Pain in right shoulder: Secondary | ICD-10-CM

## 2025-01-11 DIAGNOSIS — G8929 Other chronic pain: Secondary | ICD-10-CM

## 2025-01-19 ENCOUNTER — Ambulatory Visit: Admitting: Urology

## 2025-01-24 ENCOUNTER — Institutional Professional Consult (permissible substitution) (INDEPENDENT_AMBULATORY_CARE_PROVIDER_SITE_OTHER)

## 2025-02-16 ENCOUNTER — Ambulatory Visit: Admitting: Gastroenterology

## 2025-05-24 ENCOUNTER — Ambulatory Visit: Admitting: "Endocrinology

## 2025-08-01 ENCOUNTER — Ambulatory Visit
